# Patient Record
Sex: Male | Born: 1975
Health system: Western US, Academic
[De-identification: ages and names within clinical notes are randomized; demographics above are authoritative.]

## PROBLEM LIST (undated history)

## (undated) ENCOUNTER — Emergency Department (HOSPITAL_COMMUNITY): Admission: EM | Payer: Self-pay

## (undated) DIAGNOSIS — M459 Ankylosing spondylitis of unspecified sites in spine: Secondary | ICD-10-CM

## (undated) DIAGNOSIS — M5030 Other cervical disc degeneration, unspecified cervical region: Secondary | ICD-10-CM

## (undated) DIAGNOSIS — J45909 Unspecified asthma, uncomplicated: Secondary | ICD-10-CM

## (undated) DIAGNOSIS — M479 Spondylosis, unspecified: Secondary | ICD-10-CM

## (undated) HISTORY — PX: SPINE SURGERY: SHX786

## (undated) HISTORY — PX: TONSILLECTOMY: SUR1361

## (undated) MED ORDER — SODIUM CHLORIDE 0.9 % IV SOLN
600.00 mg | Freq: Once | INTRAVENOUS | Status: AC
Start: 2019-07-13 — End: 2019-07-08

## (undated) MED ORDER — DAPTOMYCIN 500 MG IV SOLR
600.00 mg | Freq: Once | INTRAVENOUS | Status: AC
Start: 2019-07-20 — End: 2019-07-15

## (undated) MED ORDER — DAPTOMYCIN 500 MG IV SOLR
600.00 mg | Freq: Once | INTRAVENOUS | Status: AC
Start: 2019-07-22 — End: 2019-07-17

## (undated) MED ORDER — DAPTOMYCIN 500 MG IV SOLR
600.00 mg | Freq: Once | INTRAVENOUS | Status: AC
Start: 2019-07-08 — End: 2019-07-03

## (undated) MED ORDER — DAPTOMYCIN 500 MG IV SOLR
600.00 mg | Freq: Once | INTRAVENOUS | Status: AC
Start: 2019-07-05 — End: 2019-06-30

## (undated) MED ORDER — SODIUM CHLORIDE 0.9 % IV SOLN
600.00 mg | Freq: Once | INTRAVENOUS | Status: AC
Start: 2019-07-12 — End: 2019-07-07

## (undated) MED ORDER — SODIUM CHLORIDE 0.9 % IV SOLN
600.00 mg | Freq: Once | INTRAVENOUS | Status: AC
Start: 2019-06-24 — End: 2019-06-24

## (undated) MED ORDER — DAPTOMYCIN 500 MG IV SOLR
600.00 mg | Freq: Once | INTRAVENOUS | Status: AC
Start: 2019-07-25 — End: 2019-07-20

## (undated) MED ORDER — SODIUM CHLORIDE 0.9 % IV SOLN
INTRAVENOUS | Status: AC
Start: 2019-07-24 — End: 2019-07-19

## (undated) MED ORDER — DAPTOMYCIN 500 MG IV SOLR
600.00 mg | Freq: Once | INTRAVENOUS | Status: AC
Start: 2019-07-24 — End: 2019-07-19

## (undated) MED ORDER — DAPTOMYCIN 500 MG IV SOLR
600.00 mg | Freq: Once | INTRAVENOUS | Status: AC
Start: 2019-07-31 — End: 2019-07-26

## (undated) MED ORDER — SODIUM CHLORIDE 0.9 % IV SOLN
INTRAVENOUS | Status: AC
Start: 2019-07-28 — End: 2019-07-23

## (undated) MED ORDER — SODIUM CHLORIDE 0.9 % IV SOLN
600.00 mg | Freq: Once | INTRAVENOUS | Status: AC
Start: 2019-07-09 — End: 2019-07-04

## (undated) MED ORDER — SODIUM CHLORIDE 0.9 % IV SOLN
600.00 mg | Freq: Once | INTRAVENOUS | Status: AC
Start: 2019-07-27 — End: 2019-07-22

## (undated) MED ORDER — SODIUM CHLORIDE 0.9 % IV SOLN
INTRAVENOUS | Status: AC
Start: 2019-07-04 — End: 2019-06-29

## (undated) MED ORDER — DAPTOMYCIN 500 MG IV SOLR
600.00 mg | Freq: Once | INTRAVENOUS | Status: AC
Start: 2019-07-10 — End: 2019-07-05

## (undated) MED ORDER — SODIUM CHLORIDE 0.9 % IV SOLN
8.00 mg/kg | INTRAVENOUS | Status: AC
Start: 2019-07-05 — End: 2019-08-01

## (undated) MED ORDER — DAPTOMYCIN 500 MG IV SOLR
600.00 mg | Freq: Once | INTRAVENOUS | Status: AC
Start: 2019-08-01 — End: 2019-07-27

## (undated) MED ORDER — DAPTOMYCIN 500 MG IV SOLR
600.00 mg | Freq: Once | INTRAVENOUS | Status: AC
Start: 2019-07-17 — End: 2019-07-12

## (undated) MED ORDER — DAPTOMYCIN 500 MG IV SOLR
600.00 mg | Freq: Once | INTRAVENOUS | Status: AC
Start: 2019-07-16 — End: 2019-07-11

## (undated) MED ORDER — SODIUM CHLORIDE 0.9 % IV SOLN
INTRAVENOUS | Status: AC
Start: 2019-07-03 — End: 2019-06-28

## (undated) MED ORDER — SODIUM CHLORIDE 0.9 % IV SOLN
INTRAVENOUS | Status: AC
Start: 2019-07-08 — End: 2019-07-03

## (undated) MED ORDER — DAPTOMYCIN 500 MG IV SOLR
8.00 mg/kg | INTRAVENOUS | Status: AC
Start: 2019-07-05 — End: 2019-08-01

## (undated) MED ORDER — SODIUM CHLORIDE 0.9 % IV SOLN
INTRAVENOUS | Status: AC
Start: 2019-07-14 — End: 2019-07-09

## (undated) MED ORDER — SODIUM CHLORIDE 0.9 % IV SOLN
INTRAVENOUS | Status: AC
Start: 2019-07-05 — End: 2019-06-30

## (undated) MED ORDER — SODIUM CHLORIDE 0.9 % IV SOLN
600.00 mg | Freq: Once | INTRAVENOUS | Status: AC
Start: 2019-07-04 — End: 2019-06-29

## (undated) MED ORDER — SODIUM CHLORIDE 0.9 % IV SOLN
INTRAVENOUS | Status: AC
Start: 2019-06-24 — End: 2019-06-24

## (undated) MED ORDER — SODIUM CHLORIDE 0.9 % IV SOLN
INTRAVENOUS | Status: AC
Start: 2019-07-26 — End: 2019-07-21

## (undated) MED ORDER — SODIUM CHLORIDE 0.9 % IV SOLN
600.00 mg | Freq: Once | INTRAVENOUS | Status: AC
Start: 2019-07-02 — End: 2019-06-27

## (undated) MED ORDER — SODIUM CHLORIDE 0.9 % IV SOLN
INTRAVENOUS | Status: AC
Start: 2019-08-02 — End: 2019-07-28

## (undated) MED ORDER — DAPTOMYCIN 500 MG IV SOLR
600.00 mg | Freq: Once | INTRAVENOUS | Status: AC
Start: 2019-07-01 — End: 2019-07-01

## (undated) MED ORDER — SODIUM CHLORIDE 0.9 % IV SOLN
600.00 mg | Freq: Once | INTRAVENOUS | Status: AC
Start: 2019-07-29 — End: 2019-07-24

## (undated) MED ORDER — SODIUM CHLORIDE 0.9 % IV SOLN
INTRAVENOUS | Status: AC
Start: 2019-07-17 — End: 2019-07-12

## (undated) MED ORDER — DAPTOMYCIN 500 MG IV SOLR
600.00 mg | Freq: Once | INTRAVENOUS | Status: AC
Start: 2019-07-11 — End: 2019-07-06

## (undated) MED ORDER — SODIUM CHLORIDE 0.9 % IV SOLN
INTRAVENOUS | Status: AC
Start: 2019-07-11 — End: 2019-07-06

## (undated) MED ORDER — SODIUM CHLORIDE 0.9 % IV SOLN
INTRAVENOUS | Status: AC
Start: 2019-07-29 — End: 2019-07-24

## (undated) MED ORDER — SODIUM CHLORIDE 0.9 % IV SOLN
INTRAVENOUS | Status: AC
Start: 2019-07-19 — End: 2019-07-14

## (undated) MED ORDER — DAPTOMYCIN 500 MG IV SOLR
600.00 mg | Freq: Once | INTRAVENOUS | Status: AC
Start: 2019-07-30 — End: 2019-07-25

## (undated) MED ORDER — SODIUM CHLORIDE 0.9 % IV SOLN
600.00 mg | Freq: Once | INTRAVENOUS | Status: AC
Start: 2019-07-28 — End: 2019-07-23

## (undated) MED ORDER — SODIUM CHLORIDE 0.9 % IV SOLN
INTRAVENOUS | Status: AC
Start: 2019-07-31 — End: 2019-07-26

## (undated) MED ORDER — SODIUM CHLORIDE 0.9 % IV SOLN
600.00 mg | Freq: Once | INTRAVENOUS | Status: AC
Start: 2019-07-07 — End: 2019-07-02

## (undated) MED ORDER — SODIUM CHLORIDE 0.9 % IV SOLN
INTRAVENOUS | Status: AC
Start: 2019-07-25 — End: 2019-07-20

## (undated) MED ORDER — SODIUM CHLORIDE 0.9 % IV SOLN
INTRAVENOUS | Status: AC
Start: 2019-07-18 — End: 2019-07-13

## (undated) MED ORDER — DAPTOMYCIN 500 MG IV SOLR
600.00 mg | Freq: Once | INTRAVENOUS | Status: AC
Start: 2019-07-18 — End: 2019-07-13

## (undated) MED ORDER — DAPTOMYCIN 500 MG IV SOLR
600.00 mg | Freq: Once | INTRAVENOUS | Status: AC
Start: 2019-07-23 — End: 2019-07-18

## (undated) MED ORDER — SODIUM CHLORIDE 0.9 % IV SOLN
INTRAVENOUS | Status: AC
Start: 2019-07-09 — End: 2019-07-04

## (undated) MED ORDER — SODIUM CHLORIDE 0.9 % IV SOLN
600.00 mg | Freq: Once | INTRAVENOUS | Status: AC
Start: 2019-07-19 — End: 2019-07-14

## (undated) MED ORDER — DAPTOMYCIN 500 MG IV SOLR
600.00 mg | Freq: Once | INTRAVENOUS | Status: AC
Start: 2019-07-26 — End: 2019-07-21

## (undated) MED ORDER — SODIUM CHLORIDE 0.9 % IV SOLN
600.00 mg | Freq: Once | INTRAVENOUS | Status: AC
Start: 2019-07-14 — End: 2019-07-09

## (undated) MED ORDER — SODIUM CHLORIDE 0.9 % IV SOLN
INTRAVENOUS | Status: AC
Start: 2019-07-15 — End: 2019-07-10

## (undated) MED ORDER — SODIUM CHLORIDE 0.9 % IV SOLN
INTRAVENOUS | Status: AC
Start: 2019-07-01 — End: 2019-07-01

## (undated) MED ORDER — SODIUM CHLORIDE 0.9 % IV SOLN
INTRAVENOUS | Status: AC
Start: 2019-06-25 — End: 2019-06-25

## (undated) MED ORDER — DAPTOMYCIN 500 MG IV SOLR
6.00 mg/kg | INTRAVENOUS | Status: AC
Start: 2019-06-22 — End: ?

## (undated) MED ORDER — SODIUM CHLORIDE 0.9 % IV SOLN
600.00 mg | Freq: Once | INTRAVENOUS | Status: AC
Start: 2019-07-15 — End: 2019-07-10

## (undated) MED ORDER — SODIUM CHLORIDE 0.9 % IV SOLN
INTRAVENOUS | Status: AC
Start: 2019-07-07 — End: 2019-07-02

## (undated) MED ORDER — SODIUM CHLORIDE 0.9 % IV SOLN
INTRAVENOUS | Status: AC
Start: 2019-08-01 — End: 2019-07-27

## (undated) MED ORDER — DAPTOMYCIN 500 MG IV SOLR
600.00 mg | Freq: Once | INTRAVENOUS | Status: AC
Start: 2019-07-21 — End: 2019-07-16

## (undated) MED ORDER — SODIUM CHLORIDE 0.9 % IV SOLN
INTRAVENOUS | Status: AC
Start: 2019-07-13 — End: 2019-07-08

## (undated) MED ORDER — SODIUM CHLORIDE 0.9 % IV SOLN
600.00 mg | Freq: Once | INTRAVENOUS | Status: AC
Start: 2019-06-26 — End: 2019-06-26

## (undated) MED ORDER — SODIUM CHLORIDE 0.9 % IV SOLN
INTRAVENOUS | Status: AC
Start: 2019-07-06 — End: 2019-07-01

## (undated) MED ORDER — DAPTOMYCIN 500 MG IV SOLR
600.00 mg | Freq: Once | INTRAVENOUS | Status: AC
Start: 2019-07-03 — End: 2019-06-28

## (undated) MED ORDER — SODIUM CHLORIDE 0.9 % IV SOLN
INTRAVENOUS | Status: AC
Start: 2019-07-16 — End: 2019-07-11

## (undated) MED ORDER — SODIUM CHLORIDE 0.9 % IV SOLN
INTRAVENOUS | Status: AC
Start: 2019-07-22 — End: 2019-07-17

## (undated) MED ORDER — SODIUM CHLORIDE 0.9 % IV SOLN
INTRAVENOUS | Status: AC
Start: 2019-07-30 — End: 2019-07-25

## (undated) MED ORDER — SODIUM CHLORIDE 0.9 % IV SOLN
INTRAVENOUS | Status: AC
Start: 2019-07-10 — End: 2019-07-05

## (undated) MED ORDER — SODIUM CHLORIDE 0.9 % IV SOLN
INTRAVENOUS | Status: AC
Start: 2019-07-12 — End: 2019-07-07

## (undated) MED ORDER — DAPTOMYCIN 500 MG IV SOLR
600.00 mg | Freq: Once | INTRAVENOUS | Status: AC
Start: 2019-07-06 — End: 2019-07-01

## (undated) MED ORDER — SODIUM CHLORIDE 0.9 % IV SOLN
INTRAVENOUS | Status: AC
Start: 2019-07-27 — End: 2019-07-22

## (undated) MED ORDER — SODIUM CHLORIDE 0.9 % IV SOLN
INTRAVENOUS | Status: AC
Start: 2019-07-23 — End: 2019-07-18

## (undated) MED ORDER — SODIUM CHLORIDE 0.9 % IV SOLN
INTRAVENOUS | Status: AC
Start: 2019-06-26 — End: 2019-06-26

## (undated) MED ORDER — SODIUM CHLORIDE 0.9 % IV SOLN
INTRAVENOUS | Status: AC
Start: 2019-07-21 — End: 2019-07-16

## (undated) MED ORDER — SODIUM CHLORIDE 0.9 % IV SOLN
600.00 mg | Freq: Once | INTRAVENOUS | Status: AC
Start: 2019-08-02 — End: 2019-07-28

## (undated) MED ORDER — SODIUM CHLORIDE 0.9 % IV SOLN
INTRAVENOUS | Status: AC
Start: 2019-07-02 — End: 2019-06-27

## (undated) MED ORDER — SODIUM CHLORIDE 0.9 % IV SOLN
600.00 mg | Freq: Once | INTRAVENOUS | Status: AC
Start: 2019-06-25 — End: 2019-06-25

## (undated) MED ORDER — SODIUM CHLORIDE 0.9 % IV SOLN
INTRAVENOUS | Status: AC
Start: 2019-07-20 — End: 2019-07-15

---

## 2000-06-21 ENCOUNTER — Emergency Department (HOSPITAL_COMMUNITY): Admission: EM | Admit: 2000-06-21 | Discharge: 2000-06-21 | Payer: Self-pay | Admitting: Emergency Medicine

## 2000-06-21 ENCOUNTER — Encounter: Payer: Self-pay | Admitting: Emergency Medicine

## 2006-07-25 ENCOUNTER — Emergency Department (HOSPITAL_COMMUNITY): Admission: EM | Admit: 2006-07-25 | Discharge: 2006-07-25 | Payer: Self-pay | Admitting: Emergency Medicine

## 2007-03-20 ENCOUNTER — Emergency Department (HOSPITAL_COMMUNITY): Admission: EM | Admit: 2007-03-20 | Discharge: 2007-03-20 | Payer: Self-pay | Admitting: Emergency Medicine

## 2007-03-26 ENCOUNTER — Emergency Department (HOSPITAL_COMMUNITY): Admission: EM | Admit: 2007-03-26 | Discharge: 2007-03-26 | Payer: Self-pay | Admitting: Emergency Medicine

## 2007-04-18 ENCOUNTER — Emergency Department (HOSPITAL_COMMUNITY): Admission: EM | Admit: 2007-04-18 | Discharge: 2007-04-18 | Payer: Self-pay | Admitting: *Deleted

## 2007-04-26 ENCOUNTER — Ambulatory Visit: Payer: Self-pay | Admitting: Cardiology

## 2007-04-29 ENCOUNTER — Ambulatory Visit: Payer: Self-pay | Admitting: Internal Medicine

## 2007-04-29 ENCOUNTER — Emergency Department (HOSPITAL_COMMUNITY): Admission: EM | Admit: 2007-04-29 | Discharge: 2007-04-29 | Payer: Self-pay | Admitting: Emergency Medicine

## 2007-05-03 ENCOUNTER — Ambulatory Visit: Payer: Self-pay | Admitting: *Deleted

## 2007-05-12 ENCOUNTER — Encounter: Payer: Self-pay | Admitting: Cardiology

## 2007-05-12 ENCOUNTER — Ambulatory Visit: Payer: Self-pay

## 2007-05-18 ENCOUNTER — Ambulatory Visit: Payer: Self-pay | Admitting: Cardiology

## 2007-05-28 ENCOUNTER — Emergency Department (HOSPITAL_COMMUNITY): Admission: EM | Admit: 2007-05-28 | Discharge: 2007-05-28 | Payer: Self-pay | Admitting: Emergency Medicine

## 2007-06-05 ENCOUNTER — Emergency Department (HOSPITAL_COMMUNITY): Admission: EM | Admit: 2007-06-05 | Discharge: 2007-06-05 | Payer: Self-pay | Admitting: Emergency Medicine

## 2007-06-08 ENCOUNTER — Ambulatory Visit: Payer: Self-pay | Admitting: Cardiology

## 2007-07-08 ENCOUNTER — Ambulatory Visit (HOSPITAL_COMMUNITY): Admission: RE | Admit: 2007-07-08 | Discharge: 2007-07-08 | Payer: Self-pay | Admitting: Cardiology

## 2007-07-12 ENCOUNTER — Emergency Department (HOSPITAL_COMMUNITY): Admission: EM | Admit: 2007-07-12 | Discharge: 2007-07-12 | Payer: Self-pay | Admitting: Emergency Medicine

## 2007-07-23 ENCOUNTER — Ambulatory Visit: Payer: Self-pay | Admitting: Cardiology

## 2007-07-24 ENCOUNTER — Emergency Department (HOSPITAL_COMMUNITY): Admission: EM | Admit: 2007-07-24 | Discharge: 2007-07-25 | Payer: Self-pay | Admitting: Emergency Medicine

## 2007-07-29 ENCOUNTER — Ambulatory Visit: Payer: Self-pay | Admitting: Cardiology

## 2007-08-04 ENCOUNTER — Ambulatory Visit: Payer: Self-pay | Admitting: Internal Medicine

## 2007-09-28 ENCOUNTER — Ambulatory Visit: Payer: Self-pay | Admitting: Cardiology

## 2007-09-30 ENCOUNTER — Ambulatory Visit: Payer: Self-pay | Admitting: Internal Medicine

## 2007-10-12 ENCOUNTER — Ambulatory Visit: Payer: Self-pay | Admitting: Internal Medicine

## 2007-10-21 ENCOUNTER — Ambulatory Visit: Payer: Self-pay | Admitting: Internal Medicine

## 2007-10-21 LAB — CONVERTED CEMR LAB
BUN: 13 mg/dL (ref 6–23)
Basophils Absolute: 0 10*3/uL (ref 0.0–0.1)
Basophils Relative: 0.2 % (ref 0.0–1.0)
CO2: 34 meq/L — ABNORMAL HIGH (ref 19–32)
Calcium: 9.1 mg/dL (ref 8.4–10.5)
Eosinophils Absolute: 0.3 10*3/uL (ref 0.0–0.6)
GFR calc Af Amer: 92 mL/min
GFR calc non Af Amer: 76 mL/min
Hemoglobin: 16.2 g/dL (ref 13.0–17.0)
Lymphocytes Relative: 33 % (ref 12.0–46.0)
MCHC: 34.5 g/dL (ref 30.0–36.0)
MCV: 91.8 fL (ref 78.0–100.0)
Monocytes Absolute: 0.6 10*3/uL (ref 0.2–0.7)
Monocytes Relative: 9.2 % (ref 3.0–11.0)
Neutro Abs: 3.8 10*3/uL (ref 1.4–7.7)
Platelets: 173 10*3/uL (ref 150–400)
Potassium: 4.1 meq/L (ref 3.5–5.1)
Prothrombin Time: 11.3 s (ref 10.9–13.3)

## 2007-10-25 ENCOUNTER — Ambulatory Visit (HOSPITAL_COMMUNITY): Admission: RE | Admit: 2007-10-25 | Discharge: 2007-10-25 | Payer: Self-pay | Admitting: Internal Medicine

## 2007-10-25 ENCOUNTER — Ambulatory Visit: Payer: Self-pay | Admitting: Internal Medicine

## 2007-12-17 ENCOUNTER — Ambulatory Visit: Payer: Self-pay | Admitting: Cardiology

## 2008-04-18 ENCOUNTER — Encounter: Admission: RE | Admit: 2008-04-18 | Discharge: 2008-04-18 | Payer: Self-pay | Admitting: Orthopedic Surgery

## 2008-04-26 ENCOUNTER — Ambulatory Visit (HOSPITAL_COMMUNITY): Admission: RE | Admit: 2008-04-26 | Discharge: 2008-04-27 | Payer: Self-pay | Admitting: Neurosurgery

## 2008-05-10 ENCOUNTER — Emergency Department (HOSPITAL_COMMUNITY): Admission: EM | Admit: 2008-05-10 | Discharge: 2008-05-10 | Payer: Self-pay | Admitting: Emergency Medicine

## 2008-05-11 ENCOUNTER — Inpatient Hospital Stay (HOSPITAL_COMMUNITY): Admission: EM | Admit: 2008-05-11 | Discharge: 2008-05-12 | Payer: Self-pay | Admitting: Emergency Medicine

## 2008-05-14 ENCOUNTER — Inpatient Hospital Stay (HOSPITAL_COMMUNITY): Admission: EM | Admit: 2008-05-14 | Discharge: 2008-05-23 | Payer: Self-pay | Admitting: Emergency Medicine

## 2008-05-15 ENCOUNTER — Ambulatory Visit: Payer: Self-pay | Admitting: Infectious Disease

## 2008-06-08 ENCOUNTER — Ambulatory Visit: Payer: Self-pay | Admitting: Gastroenterology

## 2008-06-13 ENCOUNTER — Emergency Department (HOSPITAL_COMMUNITY): Admission: EM | Admit: 2008-06-13 | Discharge: 2008-06-13 | Payer: Self-pay | Admitting: Emergency Medicine

## 2008-07-01 ENCOUNTER — Emergency Department (HOSPITAL_COMMUNITY): Admission: EM | Admit: 2008-07-01 | Discharge: 2008-07-01 | Payer: Self-pay | Admitting: Emergency Medicine

## 2008-11-24 ENCOUNTER — Emergency Department (HOSPITAL_COMMUNITY): Admission: EM | Admit: 2008-11-24 | Discharge: 2008-11-24 | Payer: Self-pay | Admitting: Emergency Medicine

## 2009-07-01 ENCOUNTER — Telehealth: Payer: Self-pay | Admitting: Nurse Practitioner

## 2009-07-02 ENCOUNTER — Telehealth: Payer: Self-pay | Admitting: Internal Medicine

## 2009-08-03 ENCOUNTER — Telehealth (INDEPENDENT_AMBULATORY_CARE_PROVIDER_SITE_OTHER): Payer: Self-pay | Admitting: *Deleted

## 2009-09-05 ENCOUNTER — Encounter: Payer: Self-pay | Admitting: Internal Medicine

## 2009-09-05 ENCOUNTER — Ambulatory Visit: Payer: Self-pay

## 2009-09-05 DIAGNOSIS — I4949 Other premature depolarization: Secondary | ICD-10-CM | POA: Insufficient documentation

## 2010-12-02 ENCOUNTER — Encounter: Payer: Self-pay | Admitting: Neurosurgery

## 2011-03-25 NOTE — Assessment & Plan Note (Signed)
[facility]                                 ON-CALL NOTE   GRADY, MOHABIR                       MRN:          [id number]  DATE:07/15/2007                            DOB:          [birth year]    PRIMARY CARE PHYSICIAN:  Dr. Diona Browner.   Mr. Corter is a 35 year old male with a history of PVCs and palpitation  secondary to this. He has been evaluated by Dr. Diona Browner and was doing  well on a beta blocker which he stated was Metoprolol 50 mg b.i.d. He  called this evening because he stated that in the last 48 hours, he has  had recurrent PVCs up to 4 or 5 per minute. He stated that they were  extremely bothersome to him and he did not tolerate them well at all. He  was not having any chest pain or shortness of breath, and was having no  dizziness or pre-syncope, but he still feels that they are bothering him  quite a bit and is very symptomatic with them.   I discussed the situation with Mr. Tweed. I advised him that it would  be appropriate to take an extra half tablet of his beta blocker twice  daily and therefore, increased the dose to 75 mg b.i.d. I advised him  that I would call the office which I have done and set him up for a  follow up appointment with Dr. Diona Browner and they would call him with  this. I reiterated the fact that he should not be taking any over-the-  counter cold medications or products. He should be avoiding caffeine,  and he should not be smoking or doing drugs. He stated that all of this  was correct. He stated that he did not feel the need to come to the  hospital right now. I left it with him that he would contact us if his  symptoms worsened in any way and otherwise we would see him in the  office in follow up, and that he would get his blood pressure checked  after he started on the increased dose of the beta blocker to make sure  that he was doing okay with that.      Theodore Demark, PA-C  Electronically  Signed      Gerrit Friends. Dietrich Pates, MD, [facility]  Electronically Signed   RB/MedQ  DD: 07/15/2007  DT: 07/16/2007  Job #: [id number]

## 2011-03-25 NOTE — Consult Note (Signed)
Kyle [name], Kyle [name]              ACCOUNT NO.:  [id number]   MEDICAL RECORD NO.:  [id number]           PATIENT TYPE:   LOCATION:                                 FACILITY:   PHYSICIAN:  Reginia Forts, MD     DATE OF BIRTH:  [birth year]   DATE OF CONSULTATION:  DATE OF DISCHARGE:                                 CONSULTATION   REASON FOR PHONE NOTE:  Palpitations.   Mr. Ptacek is a 35 year old gentleman with a history of symptomatic  premature ventricular contractions who called tonight complaining of 20-  30 episodes of bigeminy per minute.  He was recently seen in the  emergency room  last night and was discharged home with diltiazem 30 mg  q.6 hours.  He filled a prescription but decided not to take it to see  if his symptoms would recur.  They recurred again tonight at  approximately 1 a.m., and is now calling in.  Upon reaching him by  phone, the patient stated that his symptoms have improved now to 2-3  times a minute, and he thinks he can tough it out.  I have advised him  to start the diltiazem as was recommended last night.  He will take a  dose tonight and monitor his symptoms.  If he has any further issues, he  will call tonight or call the [facility] with Dr. Diona Browner  in the morning.      Reginia Forts, MD  Electronically Signed     RA/MEDQ  D:  07/26/2007  T:  07/26/2007  Job:  [id number]

## 2011-03-25 NOTE — Op Note (Signed)
[name], [name] NO.:  [id number]   MEDICAL RECORD NO.:  [id number]          PATIENT TYPE:  INP   LOCATION:  3022                         FACILITY:  [facility]   PHYSICIAN:  Coletta Memos, M.D.     DATE OF BIRTH:  1976-06-09   DATE OF PROCEDURE:  05/11/2008  DATE OF DISCHARGE:  05/12/2008                               OPERATIVE REPORT   PREOPERATIVE DIAGNOSIS:  Wound infection.   POSTOPERATIVE DIAGNOSIS:  Wound infection.   PROCEDURE:  Irrigation and debridement of wound infection.   COMPLICATIONS:  None.   FINDINGS:  Pus in the superior portion of super fascial aspect of the  wound, significant amount.  Nothing else identified.   ANESTHESIA:  General endotracheal.   SURGEON:  Coletta Memos, MD   ASSISTANT:  Hilda Lias, MD.   INDICATION:  Kyle [name] is a 35 year old who underwent a lumbar  laminectomy for far lateral disk on April 26, 2008.  He developed pain on  May 09, 2008.  He came to the office I was not able to express any  fluid on May 10, 2008.  He was started on antibiotics at that time,  prior to that when he had gone to the emergency room.  He called  yesterday stating that he had a fever.  At that time, I also knew that  the wound started to drain.  I told him to come in and we would simply  taken to the operating room to washout the wound.   OPERATIVE NOTE:  [name] was brought to the operating room  intubated, placed under general anesthetic.  He was rolled prone onto a  Wilson frame and all pressure points were properly padded.  His back was  prepped.  He was draped in a sterile fashion.  Using a #10 blade, an  opened the old incision without difficulty.  I immediately encountered  what was purulent material mostly coming from the superior portion of  the wound.  After opening the entire incision as it certainly appeared  that this was super fascial.  I did open the fascia and there was no  evidence of purulent inferior to that  layer.  I irrigated with Dr.  Jeral Fruit 2 L of normal saline into the incision.  I then closed the  fascial layer and thoracolumbar fascia with 0 Vicryl sutures and I  loosely reapproximated the super fascial layer with 1 Ethilon sutures.  I placed a sterile dressing over that.  He tolerated procedure well.  Specimens were sent to laboratory for both aerobic and anaerobic.  The  patient had been on vancomycin prior to this.           ______________________________  Coletta Memos, M.D.    KC/MEDQ  D:  05/12/2008  T:  05/13/2008  Job:  [id number]

## 2011-03-25 NOTE — Assessment & Plan Note (Signed)
[facility]                            CARDIOLOGY OFFICE NOTE   DANN, GALICIA                       MRN:          [id number]  DATE:07/23/2007                            DOB:          Feb 27, 1976    REASON FOR VISIT:  Followup symptomatic premature ventricular complexes.   HISTORY OF PRESENT ILLNESS:  I saw [name] back in July.  His  history is detailed in my previous note.  He continues to be troubled by  palpitations and has previously-documented ventricular ectopy, sometimes  frequent, although without any clearly sustained arrhythmias.  He  actually did not manifest any significant ectopy, during an exercise  echocardiogram, although did have some bigeminy and trigeminy during an  adenosine Myoview.  His ejection fraction is in the 45-50% range in  diffuse fashion, possibly affected by the premature ventricular  complexes, although it is not entirely clear that it is not the other  way around.  In any event, he has had no frank syncope.  He reports  being very bothered by the palpitations in that it affects his  activities of daily living.  I had him wear a Holter monitor through  [facility] and this actually showed no premature ventricular  complexes, although he did have some atrial ectopy.  We have been slowly  advancing beta blocker therapy, which now includes metoprolol 100 mg  p.o. b.i.d.  Previously, I showed some strips to Dr. Graciela Husbands, wondering  about the possibility of a focus of ventricular ectopy, although it is  not entirely clear to me that this is absolutely the case at this point.  In any event, given his troubling symptoms, he is interested in  electrophysiology consultation, which I will arrange.   ALLERGIES:  PENICILLIN.   PRESENT MEDICATIONS:  Metoprolol 100 mg p.o. b.i.d.   REVIEW OF SYSTEMS:  As described in History of Present Illness.   EXAMINATION:  Blood pressure is 104/62, weight is 165 pounds, heart  rate  is the 60s.  Patient is comfortable and in no acute distress.  NECK:  Shows no elevated jugular venous pressure or loud bruits.  LUNGS:  Clear without labored breathing.  CARDIAC EXAM:  Reveals a regular rate and rhythm, no ectopic beats.  EXTREMITIES:  Show no edema.   IMPRESSION/RECOMMENDATIONS:  1. History of previously-documented frequent ventricular ectopy      without any sustained arrhythmias, based on objective assessment.      Patient is still very troubled by this, although he does state      that, generally, the episodes seem to be somewhat less intense or      frequent with increased beta blocker therapy.  He may simply not be      at a high enough dose and we talked about increasing his metoprolol      to a total of 300 mg daily, taken either b.i.d. or t.i.d.  he also      would like to see Dr. Graciela Husbands from an electrophysiology perspective.      It is not entirely clear to me that this is  a problem that would be      dealt with from the perspective of a procedure, although I would      like his opinion.  He will ultimately need a followup      echocardiogram to reassess left ventricular function, although I      think it is too early to      consider this now.  I will plan to see him back after Dr. Odessa Fleming      consultation.  2. Further plans to follow.     [name], MD  Electronically Signed    SGM/MedQ  DD: 07/23/2007  DT: 07/23/2007  Job #: [id number]   cc:   Duke Salvia, MD, [facility]

## 2011-03-25 NOTE — Assessment & Plan Note (Signed)
[facility]                            CARDIOLOGY OFFICE NOTE   Kyle [name], Kyle [name]                       MRN:          [id number]  DATE:07/29/2007                            DOB:          04-16-1976    PRIMARY:  None.   REASON FOR PRESENTATION:  Evaluate patient with premature ventricular  contractions.   HISTORY OF PRESENT ILLNESS:  The patient is 35 years old.  He is  followed extensively by Dr. Diona Browner and has had workup that has  included an echocardiogram, stress echocardiogram and a nuclear study.  He appears to have a mildly reduced ejection fraction at about 50%.  He  has frequent ventricular ectopy.  He is due to see Dr. Graciela Husbands on  Wednesday.  However, he was in the emergency room with frequent  palpitations and a bigeminal pattern.  He was given a prescription for  Cardizem.  He did not fill this until after talking with our on-call  fellow.  He has now been taking this in addition to his beta blocker as  described below.  He has been fatigued.  He is not having any presyncope  or syncope.  He is still having the ectopy almost all day long.  It  comes on with any activity.  He has not had any chest discomfort, neck  or arm discomfort.  He has no shortness of breath, PND or orthopnea.   PAST MEDICAL HISTORY:  1. Premature ventricular contractions.  2. Mildly reduced ejection fraction.  3. ACL knee surgery.  4. Appendectomy.  5. Hiatal hernia.   ALLERGIES:  PENICILLIN.   MEDICATIONS:  1. Metoprolol 100 mg t.i.d.  2. Diltiazem 30 mg t.i.d.   REVIEW OF SYSTEMS:  As stated in the HPI and otherwise negative for  other systems.   PHYSICAL EXAMINATION:  The patient is in no distress.  Blood pressure  92/54, heart rate 46 and regular, weight 165 pounds, body mass index 26.  HEENT:  Neck no jugular venous distention at 45 degrees.  Carotid  upstroke brisk and symmetric.  No bruits, no thyromegaly.  LUNGS:  Clear to auscultation  bilaterally.  BACK:  No costovertebral angle tenderness.  HEART:  PMI not displaced or sustained.  S1 and S2 within normal limits.  No S3, no S4.  No clicks, rubs or murmurs.  ABDOMEN:  Mildly obese, positive bowel sounds normal in frequency and  pitch.  No bruits, no rebound, no guarding.  No midline pulsatile mass.  No organomegaly.  SKIN:  No rashes, no nodules.  EXTREMITIES:  Show 2+ pulses, no edema.   EKG:  Sinus bradycardia, rate 46, axis within normal limits, intervals  within normal limits, poor anterior R-wave progression, no acute ST-wave  change.   ASSESSMENT AND PLAN:  1. Palpitations.  The patient has had extensive workup for his PVCs.      Unfortunately, these remain quite symptomatic.  At this point I am      going to try to switch around his regimen a little bit.  I am going      to have him  take 60 mg t.i.d. of the diltiazem and reduce the      metoprolol to 50 mg t.i.d.  He knows that this is a trial to see if      he has any improvement.  Ultimately, if he has a focus that is      ablatable, he wants to talk to Dr. Graciela Husbands about this.  Finally, we      could consider an antiarrhythmic.  Though he had a slightly low      ejection fraction, he has no other structural heart disease that      would absolutely preclude a class Ic agent.  2. Followup will be on Wednesday with Dr. Graciela Husbands.  He is encouraged to      call this office should he have any presyncope, syncope or      worsening symptoms.     Rollene Rotunda, MD, [facility]  Electronically Signed    JH/MedQ  DD: 07/29/2007  DT: 07/29/2007  Job #: [id number]

## 2011-03-25 NOTE — Op Note (Signed)
NAMEESSAM, [name] NO.:  [id number]   MEDICAL RECORD NO.:  [id number]          PATIENT TYPE:  OIB   LOCATION:  2899                         FACILITY:  [facility]   PHYSICIAN:  Duke Salvia, MD, FACCDATE OF BIRTH:  1976/08/04   DATE OF PROCEDURE:  10/25/2007  DATE OF DISCHARGE:  10/25/2007                               OPERATIVE REPORT   PREOPERATIVE DIAGNOSIS:  Ventricular ectopy from right ventricular  outflow tract.   POSTOPERATIVE DIAGNOSIS:  Ventricular ectopy from the left ventricular  outflow tract.   PROCEDURES:  Invasive electrophysiological study with arrhythmia mapping  and pace mapping.   DESCRIPTION OF PROCEDURE:  Following obtaining informed consent, Mr.  [name] was brought to the electrophysiology laboratory and placed on  the fluoroscopic table in the supine position.  After routine prep and  drape, cardiac catheterization was performed with local anesthesia and  conscious sedation.  Noninvasive blood pressure monitoring,  transcutaneous oxygen saturation monitoring and end-tidal CO2 monitoring  were performed continuously throughout the procedure.  Following the  procedure the catheter was removed.  Hemostasis was obtained and the  patient was transferred to the floor in stable condition.   CATHETERS:  A 7-French 5-mm deflectable tip catheter was inserted into  the right femoral vein, two mapping sites in the right ventricular  outflow tract, pacing site in the right atrium, pacing site in the right  ventricle and measurement of the His bundle.   RESULTS:  Surface electrocardiogram.  Rhythm:  Sinus; RRR interval:  1164 milliseconds; PR interval: 163 milliseconds; QRS duration:  109  milliseconds; QT interval:  445 milliseconds; P-wave duration 100  milliseconds; bundle branch block:  Absent; pre-excitation:  Absent.  AH interval:  81 milliseconds; HV interval 41 milliseconds.  AV Wenckebach was 600 milliseconds.  VA Wenckebach was 400  milliseconds.  There was no evidence of accessory pathway.   Arrhythmias induced.  The patient presented to the lab having infrequent  PVCs.  With careful placement of the electrodes, two things were  immediately noted.  The first was that at V1 there was an R greater than  S ratio of 1.  In addition, the R wave in both V1 and V2 was quite  broad, constituting greater than 50% QRS.  Given this, I mentioned to  the patient that it was unlikely that we were going to be able to  identify a site in the right ventricular outflow tract that was  responsible for this ectopy, it is more likely coming from the left  ventricular outflow tract.  He asked that we proceed anyway and so we  went ahead and mapped.  The earliest ventricular activation that we  found was about minus 3-5 milliseconds.  This was right in the area of  the His bundle defect just cephalad to it.  Pace mapping from here,  however, gave a distinctly left bundle branch block QRS morphology.  Mapping throughout the right ventricular outflow tract on both the  septal surface as well as the lateral surface anteriorly and posteriorly  failed to give an upright QRS in lead V1 and indeed  the transitions were  very low at V5-V6.  We were able to get an early transition as we came  more inferiorly into the outflow tract.  However, in no case were we  able to see a transition before V3.   Based on this the catheter was removed.  The patient was informed that  based on the mapping it was my interpretation that the ectopy was  emerging likely from the left ventricular outflow tract and coronary  cusps and that I did not feel comfortable at this point pursuing  ablation of that focus.   The patient tolerated procedure well.      Duke Salvia, MD, [facility]  Electronically Signed     SCK/MEDQ  D:  10/25/2007  T:  10/26/2007  Job:  [id number]   cc:   Kyle Sidle, MD  Electrophysiology Laboratory

## 2011-03-25 NOTE — Assessment & Plan Note (Signed)
[facility]                            CARDIOLOGY OFFICE NOTE   TIMM, BONENBERGER                       MRN:          [id number]  DATE:05/18/2007                            DOB:          05/03/1976    REASON FOR VISIT:  Follow up of cardiac testing.   HISTORY OF PRESENT ILLNESS:  I saw [name] with a history of  premature ventricular complexes and somewhat atypical chest pain.  I  referred him for an exercise echocardiogram, which was unfortunately  nondiagnostic due to failure to achieve adequate heart rate.  He did not  have obvious stress-induced changes echocardiographically to suggest  ischemia, and his electrocardiogram did not demonstrate any diagnostic  ST segment changes at submaximal rates.  His resting ejection fraction  was approximately 50% with no focal wall motion abnormalities.  I  reviewed these results with him today.  He continues to have  intermittent palpitations and chest pain.  He remains very concerned  about this.   I spoke with them about perhaps trying Coreg 3.125 mg p.o. b.i.d.  instead of atenolol and options of either continued observation or  proceeding to a more definitive ischemic assessment.  We can more than  likely achieve this with an Adenosine Myoview, and he was in agreement  with proceeding.  I will plan to have him follow up and discuss the  results.   ALLERGIES:  PENICILLIN.   CURRENT MEDICATIONS:  1. Atenolol 12.5 mg p.o. p.r.n.  2. Flexeril 5 mg p.o. p.r.n.   REVIEW OF SYSTEMS:  As described in the history of present illness.  He  has not had any syncope.   PHYSICAL EXAMINATION:  VITAL SIGNS:  Blood pressure 111/74, heart rate  64.  Weight is 161 pounds.  Otherwise, there has been no change in his  examination.   IMPRESSION/RECOMMENDATIONS:  History of palpitations and premature  ventricular complexes, based on history.  Also associated with atypical  chest pain.  We will plan a change from  p.r.n. atenolol to Coreg 3.125  mg p.o. b.i.d.  I will plan to have further ischemic confirmatory  testing with an Adenosine Myoview.  I will have him follow up in the  office to discuss results.     [name], MD  Electronically Signed   SGM/MedQ  DD: 05/18/2007  DT: 05/18/2007  Job #: [id number]   cc:   Hassan Buckler. Weldon Inches, MD

## 2011-03-25 NOTE — Letter (Signed)
September 30, 2007    [name], MD  [phone]  [place], [place] [address]   RE:  OVA, MEEGAN  MRN:  [id number]  /  DOB:  Apr 19, 1976   Dear Sam:   The patient comes in today and he continues to have symptomatic PVC's.  You increased his metoprolol from 100 to 150 mg t.i.d. and he is on the  Diltiazem.  I thought what we might try is to try Verapamil as an  alternative to Diltiazem and if that does not do the trick to think  about using propafenone.  The electrocardiogram that you obtained, and I  appreciate it, from [facility] demonstrates a transition V3 so it may well be  that this is a right ventricular outflow problem anyway.   What I have suggested is that we will do the propafenone next and if  that does not work, we will take a more accelerated approach to the lab  given the fact that this has been an ongoing thing.   He is to let me know in the next couple of weeks how he is tolerating  the new medication adjustments.    Sincerely,      Duke Salvia, MD, [facility]  Electronically Signed    SCK/MedQ  DD: 09/30/2007  DT: 10/01/2007  Job #: [id number]

## 2011-03-25 NOTE — Assessment & Plan Note (Signed)
[place] [facility]                         ELECTROPHYSIOLOGY OFFICE NOTE   ALDRIDGE, KRZYZANOWSKI                       MRN:          [id number]  DATE:10/18/2007                            DOB:          08-22-1976    [name] was seen over at the [place] office last week because he  was not thriving with his propafenone. We put him on flecainide to see  how he would tolerate this. He is very uncomfortable. The question on  the table was should we proceed with catheter ablation. He would like to  do so. Looking at predictors for LV versus RV origin, it looks like it  probably is RV origin with a late transition and a RV in V2 that is less  than 52% of the QRS duration. Given that, we will plan to schedule this  with an ESI procedure.     Duke Salvia, MD, [facility]  Electronically Signed    SCK/MedQ  DD: 10/18/2007  DT: 10/18/2007  Job #: [id number]

## 2011-03-25 NOTE — Assessment & Plan Note (Signed)
[facility]                            CARDIOLOGY OFFICE NOTE   ALFONSA, VAILE                       MRN:          [id number]  DATE:09/28/2007                            DOB:          10/03/1976    REASON FOR VISIT:  Followup symptomatic premature ventricular complexes.   HISTORY OF PRESENT ILLNESS:  I saw Mr. Shomaker back in September.  His  history is detailed in my previous note, as well as subsequent notes by  Dr. Antoine Poche and consultation by Dr. Graciela Husbands, also in September.  He has  episodes of symptomatic frequent ventricular ectopy and no clear  ischemia by previous testing with an ejection fraction of 45-50%.  Dr.  Graciela Husbands saw him in consultation and, at that time, agreed with advancing  beta blocker and calcium channel blocker therapy, although did reserve  future treatments, including antiarrhythmic therapy versus potential  ablation.  His symptoms progressed.  The patient comes into the clinic  today for a routine visit, having rescheduled or missed his last few  visits.  Dr. Graciela Husbands did provide a prescription for Inderal 10 mg to be  taken before exercise, although the patient states that he misplaced  this and has not used it.  He is now on metoprolol 100 mg p.o. t.i.d.  and Diltiazem 60 mg p.o. t.i.d., which represents an increase, compared  to his previous visit with Dr. Graciela Husbands.  Symptomatically, he reports  flares of palpitations with none the last week, but more frequently-  noted symptoms this week.   Today's electrocardiogram shows sinus bradycardia at 53 beats per minute  with a single premature ventricular complex.  He remains frustrated with  his condition.   ALLERGIES:  PENICILLIN.   PRESENT MEDICATIONS:  1. Metoprolol 100 mg p.o. t.i.d.  2. Diltiazem 60 mg p.o. t.i.d.   REVIEW OF SYSTEMS:  As described in History of Present Illness.  No  syncope.   EXAMINATION:  Blood pressure 110/62, heart rate is 53, weight is 173  pounds.  Patient is comfortable and in no acute distress.  HEENT:  Conjunctiva is normal, oropharynx clear.  NECK:  Supple, no elevated jugular venous pressure, no loud bruits, no  thyromegaly.  LUNGS:  Clear without labored breathing.  CARDIAC EXAM:  Reveals a regular rate and rhythm, no S3 gallop.  ABDOMEN:  Soft, nontender.  EXTREMITIES:  Exhibit no pitting edema.  SKIN:  Warm and dry.  MUSCULOSKELETAL:  No kyphosis is noted.  NEUROPSYCHIATRIC:  The patient is alert and oriented times three.   IMPRESSION/RECOMMENDATIONS:  1. Symptomatic ventricular ectopy.  I appreciate Dr. Odessa Fleming      evaluation back in September.  His feeling at that time is that      these ventricular ectopic beats were coming from the left      ventricular outflow tract, near the left coronary cusp.  Mr.      Bulman remains symptomatic and I have asked him to see if he      tolerates an increase in the metoprolol to 150 mg p.o. t.i.d. with      continuing  the same dose of Diltiazem.  I also provided him with a      prescription for the Inderal at 10 mg prior to exercise, as Dr.      Graciela Husbands had initially recommended.  My concern is that we will be      limited in advancing his beta blocker and/or calcium channel      blocker therapy due to fatigue and weight-gain, which he is      complaining of now, and therefore, at least a trial of      antiarrhythmic therapy may be the next step.  I will schedule a      visit in the near future with Dr. Graciela Husbands to discuss initiating      antiarrhythmics and still keep in the back of our mind the      possibility of an ablation attempt.  2. Mild left ventricular dysfunction without clear ischemia based on      prior testing.  3. Further plans to follow.     [name], MD  Electronically Signed    SGM/MedQ  DD: 09/28/2007  DT: 09/28/2007  Job #: [id number]

## 2011-03-25 NOTE — Assessment & Plan Note (Signed)
[facility]                            CARDIOLOGY OFFICE NOTE   Kyle, [name]                       MRN:          [id number]  DATE:04/26/2007                            DOB:          08/27/1976    REFERRING PHYSICIAN:  Tinnie Gens P. Caporossi, MD   REASON FOR CONSULTATION:  Premature ventricular complexes.   HISTORY OF PRESENT ILLNESS:  Kyle [name] is a pleasant 35 year old male  with no reported major medical conditions who has a 1-1/2 month history  of frequent palpitations.  He describes a feeling of occasionally a  pounding heart rate and also pauses associated with chest discomfort  when he feels these palpitations.  Otherwise he has no clear exertional  chest pain or dyspnea on exertion and cannot document any sort of  pattern of palpitations associated with activity.  He notes these to be  largely sporadic, sometimes worse after eating large meals and sometimes  worse after eating large meals and sometimes worse after drinking  alcohol.  He has cut back on caffeine, stopped smoking, and has not  drank any alcohol recently and does not really notice any major change  as yet in his symptoms.  His resting electrocardiogram is essentially  normal, showing sinus rhythm with sinus arrhythmia.  He was told that he  had premature ventricular complex with some bigeminy and trigeminy  during emergency department visit, although he has had no event  recording or other cardiac testing as yet.  He denies having any frank  syncope with his symptoms.   ALLERGIES:  PENICILLIN.   PRESENT MEDICATIONS:  1. Flexeril 5 mg p.o. p.r.n.  2. Atenolol 25 mg p.o. p.r.n.   PAST MEDICAL HISTORY:  Is as outlined above.  Has a history of ACL/knee  surgery, appendectomy and hiatal hernia.   SOCIAL HISTORY:  1. Patient is single, has no children, denies any illicit substance      use or active tobacco use.  2. He quit smoking in June.  3. He drinks occasionally  one alcoholic beverage a week.  4. He is not exercising regularly at this point.  5. 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   REVIEW OF SYSTEMS:  As described in the History of Present Illness.  He  does have SEASONAL ALLERGIES.  He was diagnosed with bronchial asthma at  the age of 62.  Denies any major psychosocial stress.   FAMILY HISTORY:  Patient is adopted.   EXAMINATION:  Blood pressure is 107/65, heart rate is 75, weight is 164  pounds.  Patient is normally nourished, in no acute distress.  HEENT:  Conjunctiva is normal.  Oropharynx is clear.  NECK:  Supple.  No elevated jugular venous pressure without bruits.  No  thyromegaly is noted.  LUNGS:  Clear with regular breathing at rest.  CARDIAC EXAM:  A regular rate and rhythm.  No pericardial rub, S3 gallop  or loud murmur, PMI is nondisplaced.  ABDOMEN:  Soft, nontender, normoactive bowel sounds.  EXTREMITIES:  No pitting edema.  Skin is warm and dry.  Scattered  tattoos noted.  Pulses are 2+.  MUSCULOSKELETAL:  No kyphosis noted.  NEURO/PSYCHIATRIC:  Patient alert and oriented x3.  Affect is normal.   IMPRESSION AND RECOMMENDATION:  1. Palpitations associated with atypical chest pain.  Resting      electrocardiogram is normal.  Premature ventricular complexes have      apparently been documented based on some emergency department      visits, although I have nothing to confirm this.  He has had no      frank syncope.  He has already done some basic measures such as      cutting out caffeine, alcohol and smoking and has noticed any      marked improvement as yet.  He took atenolol for a few days but      felt this made him very weak, and he has not used this with any      regularity.  I spoke with him about documenting his palpitations      more clearly with an event recorder and we will also schedule him      for an exercise echocardiogram to exclude any cardiac structural      abnormalities and  also any possibility of exercise induced      ventricular arrhythmias.  I will have him follow up in the office      over the next few weeks to discuss the results.  2. Further plans to follow.     [name], MD  Electronically Signed    SGM/MedQ  DD: 04/26/2007  DT: 04/26/2007  Job #: [id number]   cc:   Hassan Buckler. Weldon Inches, MD

## 2011-03-25 NOTE — Assessment & Plan Note (Signed)
[facility]                                 ON-CALL NOTE   LOWELL, MAKARA                       MRN:          [id number]  DATE:04/17/2008                            DOB:          [birth year]    SUMMARY OF HISTORY:  On 04/05/08, at approximately 6:40, I received a  page from the answering service that the patient needs a medication  called in.  Within 5 minutes of that, I received a second page giving a  different phone number.  That phone number was [phone].  On three  separate occasions over the next hour and a half, I tried calling this  number and nobody answered.     Joellyn Rued, PA-C  Electronically Signed    EW/MedQ  DD: 04/17/2008  DT: 04/17/2008  Job #: [id number]

## 2011-03-25 NOTE — Discharge Summary (Signed)
NAMEJARNELL, Kyle [name] NO.:  [id number]   MEDICAL RECORD NO.:  [id number]          PATIENT TYPE:  INP   LOCATION:  3022                         FACILITY:  [facility]   PHYSICIAN:  Coletta Memos, M.D.     DATE OF BIRTH:  05/23/1976   DATE OF ADMISSION:  05/11/2008  DATE OF DISCHARGE:  05/12/2008                               DISCHARGE SUMMARY   The patient has left against medical advice.   TIME:  2149 hours.   ADMITTING DIAGNOSIS:  Wound infection.   DISCHARGE DIAGNOSIS:  Wound infection.   PROCEDURES:  He underwent today incision and drainage of a wound  infection.   INDICATIONS:  [name] is a young man who had on April 26, 2008, a  lumbar laminectomy for lateral disk herniation.  He contacted our office  on, I believe, May 09, 2008, stating that he had some pain in his back.  He was told if the pain was so severe he could go to the emergency room.  He then went to the emergency room and was evaluated by Dr. Gray Bernhardt.  Dr. Effie Shy contacted Dr. Lovell Sheehan to he thought was on call.  Dr.  Lovell Sheehan told him he was no longer on call.  This was at approximately  7:30 on May 10, 2008.  At that time, he contacted Dr. Maeola Harman who  was on call at that time.  Dr. Maeola Harman then called me and told me  that Dr. Effie Shy had evaluated [name], felt that he did not have  swelling on his back and had given him an antibiotic.  At that point,  [name] contacted my office at [facility]  sometime after 9 a.m.  At that time, Kyle [name] stated that he had a  significant amount of swelling.  He thought that the emergency room  physician had errored in his evaluation, and I told Mr. Ground to come  into the office for evaluation.  Kyle [name] was seen by myself in the  office on May 10, 2008.  I was unable at that time to express any fluid  from the incision.  It was erythematous.  It was tender.  I then  arranged for [name] to go back  to the emergency room to receive 1 g  of vancomycin intravenously.  Kyle [name] complied, went back to the  emergency room, and receive that antibiotic.  He also had been given  doxycycline as he is allergic to PENICILLIN while in the emergency room.  Kyle [name] then contacted [name] twice on May 10, 2008, that evening  stating that he had a great deal of pain and that he was not better.  [name] told him to see if the antibiotics were working and given some  more time.  On the second call, [name] had informed me that the  wound was draining slightly.  [name] said to keep the wound covered.  [name] at that point contacted me again on May 11, 2008, stating  that the wound at that time had started  to drain.  He said that the  swelling had gone down to some degree, but it was still quite tender.  He was afebrile at that first phone call.  I told Kyle [name] to  continue taking antibiotics and we would see what effect that they would  have.  His white count initially was 11.1, I believe when he was in the  emergency room.  Sedimentation rate was 7.  [name] called me later  on the second stating that he was now running a fever.  At that time, I  told him to come in through the emergency room where I had arranged for  his admission.  He was admitted last night to [facility].  Since he had just eaten prior to his arrival at the hospital, I did not  feel that the risk of aspiration was worth him going to the operating  room on an emergent basis.  We deferred that until the a.m. and he was  made n.p.o., started on IV, given IV antibiotics overnight, and had  another white count performed which was now 14, so the white count had  gone up.  The wound was clearly erythematous.  It was tender and areas  on the wound had opened where they had not been opened the previous  morning when I examined them.   Kyle [name] was taken to the operating room today where he  underwent an  decision and drainage.  He had purulent material described in the  operative note.  His wound was lightly closed with 1 Ethilon sutures.  He had a dressing placed.  Kyle [name] had expressed interest in being  able to leave the hospital to attend a birthday party which he said was  at 11 p.m.  I told him that while I did not think that it was possible.  I received a call from [facility] approximately sometime between 7 and  8, I believe, stating that Kyle [name] again was inquiring about whether  or not he could go.  I instructed Josie Saunders who was the nurse taking  care of him, to inform Mr. Sapia that would be coming to the hospital  but I thought it was quite unlikely that I would release him as I  thought it would be a good deal of drainage from the wound as I did not  close it tightly secondary to the fact that he had the infection.  I  then received another call from Eppie Gibson at approximately, I  believe, 2053 hours which was the time on my paging system.  At that  time, Eppie Gibson, the nurse now taking care of Kyle [name], stated  that he was not able to be found and he was not in the room and that  they had sent security to look for him.  I returned to the hospital at  approximately 9:40 and at that time, the patient was not found.  He was  not in his room.  He left a note in his room stating that he had taken a  walk and that he would be back later.  The official policy of [facility] was that the patient had left against medical advice if he is  not able to be found within 1-hour time.  He had been out of the room  for more than 1 hour at this point.  Given that, the patient has left  against medical advice.  He left  with an IV as far as we know still  within his upper extremity.  [name] was seen by Ms. Derrell Lolling entering  the elevator and at that point in time, he has not been seen again.  She  did not have a chance to perform an assessment  of his.  The last I saw  Mr. Leonhard, his dressing was intact.  He was moving all extremities.  This was in the postanesthesia recovery area.  Again in no time, the  patient was given instructions to leave, in no time, the patient told  that he would be leaving with the consent of myself or the medical staff  at the [facility].           ______________________________  Coletta Memos, M.D.     KC/MEDQ  D:  05/12/2008  T:  05/13/2008  Job:  [id number]

## 2011-03-25 NOTE — H&P (Signed)
NAMEDELRICO, MINEHART NO.:  [id number]   MEDICAL RECORD NO.:  [id number]          PATIENT TYPE:  INP   LOCATION:  3037                         FACILITY:  [facility]   PHYSICIAN:  Coletta Memos, M.D.     DATE OF BIRTH:  Aug 23, 1976   DATE OF ADMISSION:  05/14/2008  DATE OF DISCHARGE:                              HISTORY & PHYSICAL   Mr. Canterbury has had a number of notes dictated within the last 48 hours.  I will not go into great detail as his discharge summary from May 12, 2008, details everything to that point.  Mr. Gopal left his hospital  AMA after undergoing an irrigation and debridement of an infected wound,  status post a lumbar laminectomy on April 26, 2008.  He had contacted me  by phone yesterday asking how to take care of his wound, I gave him  instructions.  He did not mention coming back to the hospital.  He  called again today and I spoke with his aunt.  I have to assume that he  and his aunt had a discussion and he has now returned to the hospital  for treatment.  He has Staphylococcus aureus which was identified after  he left the hospital.  He has been taking Bactrim.  I do not have  sensitivities back at this time.  This is an interim history and  physical as again he had just been in the hospital.  Physical exam shows  pupils are equal, round, and reactive to light.  Full extraocular  movements.  Tongue and uvula in the midline.  Shoulder shrug is normal.  He has normal fund of knowledge.  5/5 strength in the upper and lower  extremities.  Wound is erythematous.  I am still not able to express any  significant drainage.  I did tie and closed it very loosely.  He is  erythematous along his flanks.  We will start him on vancomycin.  I will  see how he is doing with that regimen.  I will have a white count and  sedimentation rate sent today for baseline.  I do not know what he did  or did not do after he left the hospital against medical advice.  I will  have the infectious disease see him in a consultation tomorrow.  We will  start him on vancomycin tonight.           ______________________________  Coletta Memos, M.D.     KC/MEDQ  D:  05/14/2008  T:  05/15/2008  Job:  [id number]

## 2011-03-25 NOTE — Assessment & Plan Note (Signed)
[facility]                                 ON-CALL NOTE   Kyle [name], Kyle [name]                       MRN:          [id number]  DATE:07/24/2007                            DOB:          [birth year]    CARDIOLOGIST:  Dr. Diona Browner.   TELEPHONE NUMBER:  [phone].   HISTORY:  Kyle [name] is a 35 year old male patient with a history of  symptomatic PVCs and ventricular ectopy with an EF of 45% - 50%.  He  called the answering service tonight with complaints of palpitations.  He has seen Dr. Diona Browner in the past for symptomatic PVCs, bigeminy and  trigeminy.  He has been evaluated with monitors in the past.  He just  saw Dr. Diona Browner yesterday in the office who increased his metoprolol to  100 mg three times a day.  Patient called the answering service tonight  with worsening palpitations.  He denies syncope or near syncope but has  felt lightheaded from time to time.  He does feel somewhat shortness of  breath and has some chest discomfort with his palpitations.  According  to the notes he is due to be referred to Dr. Graciela Husbands for further  recommendations regarding his significantly symptomatic PVCs.   PLAN:  I explained to Kyle [name] that at 10:30 on Saturday night there  is nothing else I can offer him besides being seen in the emergency  room.  He is comfortable with this.  I have advised him to go to [facility] Emergency Room.  I asked him to have somebody drive him.  He is not  interested in calling an ambulance and I do not think at this point in  time that that is absolutely necessary.  However, I did recommend that  he not drive.   DISPOSITION:  The patient will go to the [facility] Emergency Room for  further evaluation tonight.      Tereso Newcomer, PA-C  Electronically Signed      Luis Abed, MD, [facility]  Electronically Signed   SW/MedQ  DD: 07/24/2007  DT: 07/25/2007  Job #: [id number]

## 2011-03-25 NOTE — Discharge Summary (Signed)
NAMEJACOBB, Kyle [name] NO.:  [id number]   MEDICAL RECORD NO.:  [id number]          PATIENT TYPE:  INP   LOCATION:  3037                         FACILITY:  [facility]   PHYSICIAN:  Coletta Memos, M.D.     DATE OF BIRTH:  Aug 12, 1976   DATE OF ADMISSION:  05/14/2008  DATE OF DISCHARGE:                               DISCHARGE SUMMARY   DIAGNOSIS:  Wound infection, methicillin-resistant Staphylococcus  aureus.   INDICATIONS:  [name] is a gentleman who was admitted to the  hospital after sustaining a wound infection after an operation done on  April 26, 2008.  He was readmitted to the hospital on May 11, 2008, and  on May 12, 2008, underwent a wound exploration, which revealed frank  pus.  I washed out the wound.  I closed the fascial layers.  There did  not appear to be any infection beneath the fascia and he was brought  back to his room.  He left the hospital against medical advice on May 12, 2008, with a fresh wound.  He came back to the hospital on May 14, 2008, after having removed his IV in trying to deal with his wound for 2  days on its own.  Since that time, I re-opened the wound because I was  not sure what he had done.  During that period of time, he was out of  the hospital and it looked somewhat red, and I started to have the wound  packed.  Then, I had a wound VAC placed and he was going to be  discharged home.  I spoke to [name] and told him I did not trust  him to go home with a wound VAC dressing.  He says that he would do much  better since he knows that it would help him heal that much faster.  I  will now make sure that it will occur.  Attempt to have him treated with  a wound VAC after his discharge.  He will go home today with his mother  being given instructions of how to pack his wound.  His back feels  better.  His flanks feel better and his right lower extremity feels much  better.  He is not having any pain there whatsoever.  He had  another MRI  during this admission secondary to some flank erythema and it showed  absolutely no significant problem there.  He has been on vancomycin per  IV, and he received a PICC line.  He will receive vancomycin with home  therapy.  He has been followed also by infectious disease.  He was given  instructions as to how to treat the wound.           ______________________________  Coletta Memos, M.D.     KC/MEDQ  D:  05/22/2008  T:  05/23/2008  Job:  [id number]

## 2011-03-25 NOTE — Op Note (Signed)
[name], [name]              ACCOUNT NO.:  [id number]   MEDICAL RECORD NO.:  [id number]          PATIENT TYPE:  OIB   LOCATION:  3534                         FACILITY:  [facility]   PHYSICIAN:  Coletta Memos, M.D.     DATE OF BIRTH:  06-08-1976   DATE OF PROCEDURE:  04/26/2008  DATE OF DISCHARGE:  04/27/2008                               OPERATIVE REPORT   PREOPERATIVE DIAGNOSIS:  Far-lateral disk herniation L4-5 right side.   POSTOPERATIVE DIAGNOSIS:  Far-lateral disk herniation L4-5 right side.   PROCEDURE:  Right far-lateral diskectomy with microdissection L4-5 on  the left.   SURGEON:  Coletta Memos, M.D.   ASSISTANT:  Hilda Lias, M.D.   COMPLICATIONS:  None.   INDICATIONS:  [name] is a young man with significant pain in his  right lower extremity.  There is consistent with an L4 radiculopathy.  MRI showed far-lateral disk herniation.  I therefore offered he agreed  to undergo operative decompression as he did not want to do any further  conservative treatment.   BODY OF NOTE:  [name] was brought to the operating room intubated  and placed under general anesthetic without difficulty.  His back was  prepped and he was draped in a sterile fashion.  I infiltrated 0.5%  lidocaine, 1:200,000 strength epinephrine into the lumbar region.  I  opened the skin with a #10 blade took this down to the thoracolumbar  fascia.  I then exposed the lamina of L4 and the pars interarticularis  of L4.  I was able to then use a Kerrison punch and notch.  We drilled  and removed some of the lateral more portion of the pars.  I then was  able to identify the L4 root and the neural foramen and the far-lateral  position.  I was able to then remove what was not a significant amount  of distal what was a fairly large bony edge.  The nerve root was very  well decompressed postoperatively, and I certainly did go into the disk  space both in the medial and lateral foramen to remove disk  material.  This was done without great difficulty.  I used a microscope for  microscopic dissection and help in that area along with Dr. Cassandria Santee  assistance.  After thorough decompression, I then used fentanyl and  steroid over the dorsal root ganglion.  I then closed the wound in  layered fashion using Vicryl sutures to reapproximate the thoracolumbar  subcutaneous and subcuticular layers.  I used Dermabond for sterile  dressing.  The patient was then turned prone and extubated.           ______________________________  Coletta Memos, M.D.     KC/MEDQ  D:  05/04/2008  T:  05/05/2008  Job:  [id number]

## 2011-03-25 NOTE — Assessment & Plan Note (Signed)
[facility]                                 ON-CALL NOTE   KOSISOCHUKWU, BURNINGHAM                       MRN:          [id number]  DATE:10/08/2007                            DOB:          July 25, 1976    [name] called in this evening secondary to complaints of nausea,  fatigue, and dizziness that occurs approximately two hours after taking  a dose of Propafenone and persists for approximately eight hours prior  to having to take the next dose.  He has had some decrease in his  symptomatic PVCs while on Propafenone, which was just started a couple  of days ago.  He was asking what he should do.  I recommended that it  sounds as though he has some very common reactions to Propafenone and  perhaps he is not going to tolerate that medication.  At this point, he  can discontinue the Propafenone and resume his prior medications, which  were Lopressor 100 to 150 mg t.i.d. as well as Verapamil 80 mg t.i.d.  He asked if it was possible to increase the dose of the Verapamil as he  did not feel that the 80 mg dose was adequately keeping his PVCs at bay,  and I said yes that he could go up to one-and-a-half tablets or 120 mg  t.i.d. provided that his systolic blood pressure was greater than 100  and baseline heart rate was greater than 50.  I will call into the  office and leave a message for him to be scheduled to see Dr. Graciela Husbands at  some point next week and advised him that if he does not hear from our  office by midday Monday that he should call in as well.     Nicolasa Ducking, ANP  Electronically Signed    CB/MedQ  DD: 10/08/2007  DT: 10/08/2007  Job #: [id number]

## 2011-03-25 NOTE — Assessment & Plan Note (Signed)
[facility]                            CARDIOLOGY OFFICE NOTE   Kyle, [name]                       MRN:          [id number]  DATE:06/08/2007                            DOB:          1976/08/15    REASON FOR VISIT:  Follow up premature ventricular  complexes/palpitations.   HISTORY OF PRESENT ILLNESS:  I saw [name] earlier in July. His  history is detailed in my previous notes. I referred him for an  adenosine Myoview given a technically nondiagnostic exercise  echocardiogram. This study showed some left ventricular cavity  enlargement with soft tissue attenuation but no evidence of scar or  ischemia. No ejection fraction was calculated, I presumed due to  frequent ventricular ectopy. His ejection fraction had previously been  quantified in the 45-50% range by echocardiography, and he had no major  valvular abnormalities. He continues to complain of problems with  frequent palpitations. He has had no syncope. His medical regimen has  been changed since I last saw him. I initially placed him on Coreg 3.125  mg p.o. b.i.d., and this was increased after an emergency department  visit to 6.25 mg p.o. b.i.d. He returned for another emergency  department visit, and the medication was switched to Toprol-XL 50 mg  daily, which he has been taking for the last few days. He feels that  this may be somewhat more effective, but he still has frequent ectopy. I  reviewed his stress echocardiogram tracings with Dr. Graciela Husbands and discussed  the situation with him. The morphology of the premature ventricular  complexes looks to be potentially indicative of origination in the left  ventricular outflow track. I have not seen any documentation of  sustained ventricular arrhythmias, and as stated before, he has not had  any syncope. His ischemic evaluation is overall reassuring. The question  is whether he has frequent ventricular ectopy with resulting mild  cardiomyopathy, or vice versa. He is not manifesting any heart failure  symptoms, and his main complaint is that of palpitations.   ALLERGIES:  PENICILLIN.   PRESENT MEDICATIONS:  Toprol-XL 50 mg p.o. daily.   REVIEW OF SYSTEMS:  As described in the history of present illness.   EXAMINATION:  VITAL SIGNS:  Blood pressure today 92/57, heart rate 54,  weight 162 pounds.  GENERAL:  The patient is normally nourished in no acute distress.  NECK:  Examination of the neck reveals no elevated jugular venous  pressure and no bruits. No thyromegaly.  LUNGS:  Are clear without labored at rest.  CARDIAC EXAM:  Reveals a regular rate and rhythm with frequent ectopic  beats. No loud systolic murmur or S3 gallop.  EXTREMITIES:  Continue to exhibit no edema.   IMPRESSION AND RECOMMENDATIONS:  1. Frequent ventricular ectopy without any documentation of sustained      ventricular arrhythmias. There has been no associated syncope, and      recent ischemic evaluation was overall reassuring. He does have      evidence of mild cardiomyopathy with an ejection fraction of 45-      50%. I discussed the  situation and reviewed the strips with Dr.      Graciela Husbands today in clinic and also went over the information with the      patient. My plan is to continue Toprol-XL, possibly advancing this      to 75 mg daily in divided dose as tolerated. I would also like to      get a 24-hour Holter monitor to quantify percentage of premature      ventricular complexes versus sinus beats. It may well be that he      has a focus of ventricular ectopy and that at some point ablation      would be a consideration. I will have him return to the clinic to      review his Holter monitor and symptom control on increased dose      beta blocker therapy. If he continues to manifest frequent ectopy,      a formal electrophysiology consultation will be pursued. He was      comfortable with this plan.  2. Further plans to follow.      [name], MD  Electronically Signed    SGM/MedQ  DD: 06/08/2007  DT: 06/09/2007  Job #: [id number]

## 2011-03-25 NOTE — H&P (Signed)
NAMEAHMADOU, BOLZ NO.:  [id number]   MEDICAL RECORD NO.:  [id number]          PATIENT TYPE:  INP   LOCATION:  3022                         FACILITY:  [facility]   PHYSICIAN:  Coletta Memos, M.D.     DATE OF BIRTH:  1976/06/20   DATE OF ADMISSION:  05/11/2008  DATE OF DISCHARGE:                              HISTORY & PHYSICAL   ADMITTING DIAGNOSIS:  Lumbar wound infection, status post lumbar  diskectomy.   INDICATIONS:  Mr. Mulvey is a 35 year old young man who underwent a  lumbar laminectomy and diskectomy in a far lateral position on the right  side at L4-L5.  Postoperatively, he had done quite well with resolution  of the pain in his lower extremity.  However, over the last 3 days, it  has been increasing.  His operation was on April 26, 2008.  He first came  to the emergency room on May 10, 2008, secondary to what he said was  swelling in his back with extraordinary pain and difficulty with  sleeping.  He was seen in the emergency room and then called my office.  I then saw him in the office on May 10, 2008.  At that time, I was  unable to express any fluid from his wound, which I put under pressure.  I then sent him back to the emergency room on May 10, 2008, for a gram  of vancomycin to be given IV.  He was also given doxycycline at the time  of ER visit prior to he is being seen in the office.  He called my  partner last night stating that he was having increasing pain.  He  called my partner again last night with the same complaints of  increasing pain.  He then called me again this morning stating that he  had had some drainage from his wound.  At that time, I told him that it  might be best to continue the antibiotics to see if there is any  improvement since we have just started.  He called again this evening at  approximately 8 o'clock or so stating that he now is running a fever.  I  instructed him to go to the emergency room for direct admission to  the  floor.   Mr. Diodato upon interview now at 11:30 he states that his back feels  better.  There is a generalized erythema surrounding the incision and it  has opened superficially and then over places, which were closed  yesterday morning.  Strength is full in the lower extremities.  Proprioception intact.  No cervical masses or bruits.  Lungs are clear.  Heart, regular rhythm and rate.  No murmurs or rubs.  His white count is  down from 11 on May 10, 2008, to 14 this evening.   MEDICATIONS:  Toprol XL, flecainide, and Vicoprofen.  He was also taking  doxycycline.   His sedimentation rate was 7 on May 10, 2008.   PAST MEDICAL HISTORY:  Premature ventricular complexes.  He has been  placed on metoprolol 150 mg p.o. t.i.d. and flecainide 100 mg  p.o.  b.i.d. for that problem.   ALLERGIES:  He has an allergy to PENICILLIN.   SOCIAL HISTORY:  He does not use illicit drugs.  He does have a history  of hepatitis C.  He does not smoke.  He does not drink.  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   PHYSICAL EXAMINATION:  GENERAL:  He is alert and oriented x4.  VITAL SIGNS:  He has blood pressure of 109/70, temperature 101.5, pulse  79, respiratory rate 18, and 98% saturation on air.  HEENT:  Pupils are equal, round, and reactive to light.  Full  extraocular movements.  Symmetric facies.  Tongue, uvula midline.  EXTREMITIES:  Shoulder shrug is normal.  Normal strength in the upper  and lower extremities.  No clubbing, cyanosis, or edema.  ABDOMEN:  Soft and nontender.  There is erythema surrounding his  incision.  No drainage is visible, but there are areas open along the  incision line which were closed yesterday morning.   I will make the patient NPO.  I will plan on taking him to the operating  room in the morning to explore the wound.  It seems that this has  progressed with his white count increasing despite antibiotic therapy.  I will just continue on  vancomycin.  He hopes for a pure culture were  probably abandoned when I had the patient given vancomycin and he has  been on the Bactrim at least since yesterday.           ______________________________  Coletta Memos, M.D.     KC/MEDQ  D:  05/11/2008  T:  05/12/2008  Job:  [id number]

## 2011-03-25 NOTE — Assessment & Plan Note (Signed)
[facility]                                 ON-CALL NOTE   Kyle [name], Kyle [name]                       MRN:          [id number]  DATE:10/22/2007                            DOB:          08-25-1976    Patient of Dr. Graciela Husbands.   PHONE NUMBER:  Is [phone].   Attempted telephone contact October 22, 2007.   I received a call from the answering service tonight saying that Mr.  Kyle [name] needed a phone call regarding a reaction to medications.  I  called the number and got an answering machine and left a message asking  him to please call back if he still needed some help.  I attempted to  look up [name] in the computer but was unable to locate him based  upon the spelling I have of his name.  No further information is  available at this time.  And, I will attempt to contact him again later.      Theodore Demark, PA-C  Electronically Signed      Bevelyn Buckles. Bensimhon, MD  Electronically Signed   RB/MedQ  DD: 10/22/2007  DT: 10/24/2007  Job #: [id number]

## 2011-03-25 NOTE — Assessment & Plan Note (Signed)
[facility]                                 ON-CALL NOTE   EMMITTE, SURGEON                       MRN:          [id number]  DATE:07/19/2007                            DOB:          02-08-1976    He is a patient of Dr. Simona Huh.  I received a page from the  answering service tonight in reference to [facility] with the  message being that he was having chest pains and PVCs.  I reviewed his E-  chart record and see that he had a negative stress echo May 12, 2007.  Further reviewed notes by Dr. Diona Browner as well as numerous on-call phone  notes, noting that he has a history of PVCs with atypical chest pain.  I  called him at the number provided by the answering service, [phone]-  [phone], 3 times and left a message stating to give [place] a call back at the  answering service if he still has a need for [place].  He did not answer his  phone at all and has not called back yet.     Nicolasa Ducking, ANP  Electronically Signed    CB/MedQ  DD: 07/19/2007  DT: 07/20/2007  Job #: [id number]

## 2011-03-25 NOTE — Letter (Signed)
August 04, 2007    Kyle Sidle, MD  [phone]  [place], [place] [address]   RE:  Kyle, [name]  MRN:  [id number]  /  DOB:  Sep 15, 1976   Dear Kyle [name]:   It was a pleasure to see Kyle [name] today at your request because of  PVCs.   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  Over the last 4  months or so he has had increasing problems with these.  More remotely  he was aware of his irregular heart beat only by taking his pulse.  Now  he has become aware of it because of palpitations and when they have  come in flurries, they have been associated with shortness of breath,  lightheadedness and chest discomfort.  They would come in waves of hours  to days and they have been very disruptive.   You undertook a cardiac evaluation including an echo demonstrating  modest depression of left ventricular systolic function at 45-50%.  A  Myoview scan demonstrated no ischemia but it was not able to be gated  because of frequent ventricular ectopy.   Therapeutically he was tried initially on atenolol, which did not work,  Youth worker, which did not work, and then [employer] at 50 mg a day, which did.  He then had more symptoms.  The dose was up-titrated.  That worked for  [employer].  Again symptoms recurred.  It was back down-titrated to 50 mg  and diltiazem to 30 mg three times a day was added.   He is tolerating his medications without significant side effect; he is  aware that his weight has gone up a couple of pounds.  He is not sure  whether this is related to medication or to the possibility that he is  not exercising because exercise seems to provoke these ectopic beats.   SOCIAL HISTORY:  He is a Consulting civil engineer.  He lives with a fellow student who is  a sociology major.  He denies the use of cigarettes, alcohol or  recreational drugs.   MEDICATIONS:  As previously noted.   He is allergic to  PENICILLIN.   PAST SURGICAL HISTORY:  Notable for knee surgery.   On examination, he is a young Caucasian male appearing his stated age of  35.  His blood pressure is 89/50 with a pulse of 45.  These vital signs are  relatively typical for him.  HEENT:  No icterus or xanthomata.  The neck veins were flat and the carotids were brisk and full  bilaterally without bruits.  BACK:  Without kyphosis or scoliosis.  LUNGS:  Clear.  Heart sounds were regular without murmurs or gallops.  ABDOMEN:  Soft with active bowel sounds without midline pulsation or  hepatomegaly.  His femoral pulses were 2+, distal pulses were intact.  There was no  clubbing, cyanosis, or edema.  NEUROLOGIC:  Grossly normal.  SKIN:  Warm and dry.   Electrocardiogram demonstrated sinus rhythm with normal intervals.   Electrocardiogram of his ventricular ectopy demonstrated a left bundle  branch block inferior axis morphology ectopic beat; notably, however,  the transition to a positive QRS occurs in lead V1.   IMPRESSION:  1. Frequent ventricular ectopy associated with symptoms of      lightheadedness, chest pain and shortness of breath, with a left      bundle branch block inferior axis morphology  and a transition at      V1.  2. Modest depression of left ventricular systolic function with an      ejection fraction of 45%.  3. Modest hypotension.   Kyle [name], Kyle [name] has symptomatic PVCs that are much ameliorated by your  medical therapy. He is unable to exercise, so I have given him a  prescription for Inderal 10 mg to try and take before he exercises to  see if that does not ameliorate that problem and he can get back to  doing that.  He is quite frustrated about this whole situation and  feeling that life is a little bit unfair.  We talked a little bit about  that.   The other thing that we discussed were alternative treatment options.  These would include antiarrhythmic drugs as well as catheter  ablation.  The transition of his ectopy in V1 suggests that these are, in fact,  coming from the left ventricular outflow tract and, indeed, the left  coronary cusp.  There is not a huge experience anywhere with these but  they have been able to be carefully ablated either endovascularly or  epicardially with great care as to the location of the coronary ostia.   I have reviewed this with him.   In the interim I have suggested that we continue him on his current  regimen apart from the added Inderal.   In the event that he has more symptoms, we could consider antiarrhythmic  drug therapy and then catheter ablation thereafter.   Thanks very much for the consultation.    Sincerely,      Kyle Salvia, MD, [facility]  Electronically Signed    Kyle [name]/MedQ  DD: 08/04/2007  DT: 08/05/2007  Job #: [id number]   CC:    Kyle [name]

## 2011-03-25 NOTE — Assessment & Plan Note (Signed)
[facility]                                 ON-CALL NOTE   DEMTRIUS, ROUNDS                       MRN:          [id number]  DATE:07/19/2007                            DOB:          08/29/1976    Patient Kyle [name], medical record number [id number], date of birth  1976/02/18, date of note July 19, 2007.  Mr. Wade called back  this evening to discuss his PVCs and chest pain.  Please refer to  earlier note where I was not able to reach him.  He has been having  symptomatic PVCs and shortness of breath pretty much all day.  He called  into the office earlier today and was told to increase his Toprol to 100  mg b.i.d. which he has done.  Despite this, he has not noticed much  change in the amount of his PVCs.  With rest, he is somewhat better.  I  asked what his pulse and blood pressure had been running, and he said  that his pulse has been between 60s-80s while he has no way of checking  his blood pressure.  I asked what his blood pressure normally runs, and  he says it is usually about 100-110 prior to ever being placed on  Toprol.  I advised that given that he is currently taking 100 mg of  Toprol b.i.d., I am a little bit reluctant to advise him to take any  additional beta blocker without knowing what his blood pressure is and  that if he is feeling stable now while resting that he should check his  blood pressure in the morning and let us know in the office how it is  running so we can make some additional medical decision making.  If,  however, he is to become more symptomatic or uncomfortable, that he  should have a low threshold for coming into the ER for evaluation where  we can monitor him and potentially even give him some IV beta blocker.  He asked if we could move up his appointment to see Dr. Diona Browner.  That  is currently scheduled for this coming Friday.  I will call over to the  office and leave a message and informed him  that it may be unlikely that  Dr. Diona Browner is in the office prior to Friday but that perhaps we can  get him on either a PA or nurse practitioner's schedule Wednesday or  Thursday.  He was grateful for the call back and will call into the  office tomorrow or come into the ED if he has any additional questions  or problems.     Nicolasa Ducking, ANP  Electronically Signed    CB/MedQ  DD: 07/19/2007  DT: 07/20/2007  Job #: [id number]

## 2011-03-25 NOTE — Assessment & Plan Note (Signed)
[facility]                            CARDIOLOGY OFFICE NOTE   BRENON, ANTOSH                       MRN:          [id number]  DATE:12/17/2007                            DOB:          06-08-1976    REASON FOR VISIT:  Follow-up premature ventricular complexes.   HISTORY OF PRESENT ILLNESS:  [name] comes back in for a routine  visit.  I saw him originally back in June 2008 in referral for  symptomatic premature ventricular complexes.  In summary, he underwent a  variety of medication adjustments including antiarrhythmics, ultimately  settling on high-dose metoprolol as well as flecainide as outlined  below.  I had him evaluated by Dr. 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  Ablation was not  pursued at that time and Mr. Rehm tells me that he has an appointment  scheduled to see Dr. Macon Large on the 20th of this month.  Symptomatically  he actually reports having fewer palpitations although he is very  concerned about continuing medications long-term to control the problem.  His electrocardiogram today showed sinus rhythm with a leftward axis.  QRS duration of 118 msec and nonspecific ST-T wave changes. QT interval  is normal.  He has had no frank chest pain or syncope.   ALLERGIES:  PENICILLIN.   CURRENT MEDICATIONS:  1. Metoprolol 150 mg p.o. t.i.d.  2. Flecainide 100 mg p.o. b.i.d.   REVIEW OF SYSTEMS:  As described in the history of present illness.  Otherwise noted.   PHYSICAL EXAMINATION:  Blood pressure today 112/66, heart rate is 70.  The patient is comfortable and in no acute distress.  NECK:  No elevated jugular venous pressure and no bruits.  LUNGS:  Clear without labored breathing.  CARDIAC:  Regular rate and rhythm. No S3 gallop or  loud murmur.  EXTREMITIES:  Exhibit no pitting edema.   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  Graciela Husbands at electrophysiology study.  My sense is that the patient's  symptoms are somewhat better controlled on the present regimen, although  he is very reluctant to continue medications long-term and has a visit  scheduled to see Dr. Macon Large at [facility] on the 20th of this  month to discuss the potential for an ablation.  I did not make any  specific medication changes today.     [name], MD  Electronically Signed    SGM/MedQ  DD: 12/17/2007  DT: 12/18/2007  Job #: [id number]   cc:   Duke Salvia, MD, [facility]  Val Riles, MD

## 2011-03-28 NOTE — Assessment & Plan Note (Signed)
[facility]                                 ON-CALL NOTE   LARENZ, FRASIER                       MRN:          [id number]  DATE:02/29/2008                            DOB:          07/18/1976    I received a page through the answering service for Mr. Branscom  regarding medications.  I promptly called him back.  Mr. Seybold stated  he had lost his bottle of his beta-blocker and had not taken it in a few  days but continued to take his flecainide.  He stated he felt much  better off the metoprolol 150 mg p.o. t.i.d. and he decided he was not  going to take it anymore but today he began having some palpitations and  states he did not feel himself.  I explained to Mr. Leighton he could not  abruptly discontinue his beta-blocker and that he need discuss with Dr.  Graciela Husbands whether or not he was a candidate for discontinuing his beta-  blocker therapy.  He stated he had only missed 2 days' doses and would  resume his previously prescribed dose and then speak with Dr. Graciela Husbands and  see if he could stay off this medication.  He denied any  lightheadedness, dizziness, presyncope or syncopal episodes, chest  discomfort.  He was complaining of mild palpitations.      Dorian Pod, ACNP  Electronically Signed      Madolyn Frieze. Jens Som, MD, [facility]  Electronically Signed   MB/MedQ  DD: 03/01/2008  DT: 03/01/2008  Job #: [id number]

## 2011-08-07 LAB — CBC
HCT: 38.2 — ABNORMAL LOW
HCT: 44
HCT: 44.5
Hemoglobin: 16.1
MCHC: 34.7
MCHC: 34.4
MCHC: 35.5
MCV: 90
MCV: 90.5
Platelets: 150
Platelets: 208
RBC: 5.17
RBC: 4.87
RBC: 4.91
RDW: 11.9
RDW: 12.4
WBC: 11.6 — ABNORMAL HIGH
WBC: 14 — ABNORMAL HIGH

## 2011-08-07 LAB — DRUG SCREEN PANEL (SERUM)
Amphetamine Scrn: NEGATIVE
Benzodiazepine Scrn: POSITIVE
Cannabinoid, Blood: NEGATIVE
Opiates, Blood: NEGATIVE
Phencyclidine, Serum: NEGATIVE

## 2011-08-07 LAB — COMPREHENSIVE METABOLIC PANEL
BUN: 16
CO2: 27
Calcium: 9
Chloride: 104
Creatinine, Ser: 1.17
GFR calc non Af Amer: >60
Total Bilirubin: 0.9

## 2011-08-07 LAB — SEDIMENTATION RATE: Sed Rate: 7

## 2011-08-07 LAB — ANAEROBIC CULTURE

## 2011-08-07 LAB — BASIC METABOLIC PANEL
BUN: 9
CO2: 30
Calcium: 9.3
Creatinine, Ser: 1.02
GFR calc Af Amer: >60
GFR calc Af Amer: >60
GFR calc non Af Amer: >60
GFR calc non Af Amer: >60
Potassium: 3.5
Sodium: 137
Sodium: 134 — ABNORMAL LOW

## 2011-08-07 LAB — DIFFERENTIAL
Basophils Absolute: 0
Eosinophils Relative: 2
Eosinophils Relative: 2
Lymphocytes Relative: 12
Lymphocytes Relative: 12
Lymphs Abs: 1.7
Neutro Abs: 9.3 — ABNORMAL HIGH
Neutrophils Relative %: 80 — ABNORMAL HIGH

## 2011-08-07 LAB — WOUND CULTURE

## 2011-08-07 LAB — PROTIME-INR
INR: 1
Prothrombin Time: 13.3

## 2011-08-07 LAB — HEPATIC FUNCTION PANEL
AST: 59 — ABNORMAL HIGH
Albumin: 3.9
Alkaline Phosphatase: 62
Total Protein: 6.7

## 2011-08-07 LAB — HIV ANTIBODY (ROUTINE TESTING W REFLEX): HIV: NONREACTIVE

## 2011-08-22 LAB — BASIC METABOLIC PANEL
CO2: 29
Chloride: 104
GFR calc Af Amer: >60
Potassium: 3.8
Sodium: 140

## 2011-08-22 LAB — CBC
HCT: 46.4
Hemoglobin: 16.1
MCHC: 34.7
MCV: 90.3
RBC: 5.14
WBC: 7.8

## 2011-08-22 LAB — POCT CARDIAC MARKERS: Troponin i, poc: <0.05

## 2011-08-25 LAB — BASIC METABOLIC PANEL
BUN: 12
CO2: 26
Calcium: 9.1
Chloride: 102
Creatinine, Ser: 1.04
GFR calc Af Amer: >60
GFR calc non Af Amer: >60
Sodium: 138

## 2011-08-25 LAB — I-STAT 8, (EC8 V) (CONVERTED LAB)
Acid-Base Excess: 1
Bicarbonate: 29.1 — ABNORMAL HIGH
Chloride: 105
HCT: 47
Operator id: 284251
pCO2, Ven: 57.9 — ABNORMAL HIGH

## 2011-08-25 LAB — POCT CARDIAC MARKERS
CKMB, poc: <1 — ABNORMAL LOW
CKMB, poc: <1 — ABNORMAL LOW
Troponin i, poc: <0.05

## 2011-08-25 LAB — RAPID URINE DRUG SCREEN, HOSP PERFORMED
Amphetamines: NOT DETECTED
Tetrahydrocannabinol: NOT DETECTED

## 2011-08-25 LAB — POCT I-STAT CREATININE: Creatinine, Ser: 1.2

## 2011-08-25 LAB — CBC
HCT: 46.1
Platelets: 187
RDW: 12.1

## 2011-08-27 LAB — I-STAT 8, (EC8 V) (CONVERTED LAB)
Bicarbonate: 28.6 — ABNORMAL HIGH
HCT: 54 — ABNORMAL HIGH
Hemoglobin: 18.4 — ABNORMAL HIGH
Operator id: 270651
Sodium: 139
TCO2: 30

## 2011-08-27 LAB — D-DIMER, QUANTITATIVE: D-Dimer, Quant: <0.22

## 2011-08-27 LAB — POCT CARDIAC MARKERS: Troponin i, poc: <0.05

## 2011-08-28 LAB — I-STAT 8, (EC8 V) (CONVERTED LAB)
Acid-Base Excess: 2
Chloride: 106
HCT: 49
Operator id: 284141
Potassium: 4.2
TCO2: 30
pCO2, Ven: 50.4 — ABNORMAL HIGH

## 2011-08-28 LAB — POCT I-STAT CREATININE: Operator id: 284141

## 2014-03-07 ENCOUNTER — Emergency Department (HOSPITAL_COMMUNITY)
Admission: EM | Admit: 2014-03-07 | Discharge: 2014-03-08 | Disposition: A | Discharge status: Home / Self Care | Payer: Self-pay | Attending: Emergency Medicine | Admitting: Emergency Medicine

## 2014-03-07 ENCOUNTER — Encounter (HOSPITAL_COMMUNITY): Payer: Self-pay | Admitting: Emergency Medicine

## 2014-03-07 ENCOUNTER — Emergency Department (HOSPITAL_COMMUNITY): Payer: Self-pay

## 2014-03-07 DIAGNOSIS — W1809XA Striking against other object with subsequent fall, initial encounter: Secondary | ICD-10-CM | POA: Insufficient documentation

## 2014-03-07 DIAGNOSIS — Y9301 Activity, walking, marching and hiking: Secondary | ICD-10-CM | POA: Insufficient documentation

## 2014-03-07 DIAGNOSIS — Y929 Unspecified place or not applicable: Secondary | ICD-10-CM | POA: Insufficient documentation

## 2014-03-07 DIAGNOSIS — S0990XA Unspecified injury of head, initial encounter: Secondary | ICD-10-CM

## 2014-03-07 DIAGNOSIS — Z23 Encounter for immunization: Secondary | ICD-10-CM | POA: Insufficient documentation

## 2014-03-07 DIAGNOSIS — S0083XA Contusion of other part of head, initial encounter: Secondary | ICD-10-CM | POA: Insufficient documentation

## 2014-03-07 DIAGNOSIS — S0180XA Unspecified open wound of other part of head, initial encounter: Secondary | ICD-10-CM | POA: Insufficient documentation

## 2014-03-07 DIAGNOSIS — Z79899 Other long term (current) drug therapy: Secondary | ICD-10-CM | POA: Insufficient documentation

## 2014-03-07 DIAGNOSIS — S0181XA Laceration without foreign body of other part of head, initial encounter: Secondary | ICD-10-CM

## 2014-03-07 DIAGNOSIS — Z8679 Personal history of other diseases of the circulatory system: Secondary | ICD-10-CM | POA: Insufficient documentation

## 2014-03-07 DIAGNOSIS — Z880 Allergy status to penicillin: Secondary | ICD-10-CM | POA: Insufficient documentation

## 2014-03-07 DIAGNOSIS — S0003XA Contusion of scalp, initial encounter: Secondary | ICD-10-CM | POA: Insufficient documentation

## 2014-03-07 DIAGNOSIS — F17200 Nicotine dependence, unspecified, uncomplicated: Secondary | ICD-10-CM | POA: Insufficient documentation

## 2014-03-07 DIAGNOSIS — S1093XA Contusion of unspecified part of neck, initial encounter: Secondary | ICD-10-CM

## 2014-03-07 MED ORDER — TETANUS-DIPHTH-ACELL PERTUSSIS 5-2.5-18.5 LF-MCG/0.5 IM SUSP
0.5000 mL | Freq: Once | INTRAMUSCULAR | Status: AC
  Administered 2014-03-07: 0.5 mL via INTRAMUSCULAR
  Filled 2014-03-07: qty 0.5

## 2014-03-07 MED ORDER — HYDROMORPHONE HCL PF 1 MG/ML IJ SOLN
1.0000 mg | Freq: Once | INTRAMUSCULAR | Status: AC
  Administered 2014-03-07: 1 mg via INTRAMUSCULAR
  Filled 2014-03-07: qty 1

## 2014-03-07 MED ORDER — HYDROCODONE-IBUPROFEN 7.5-200 MG PO TABS: 1.0000 | ORAL_TABLET | Freq: Four times a day (QID) | ORAL | Status: DC | PRN

## 2014-03-07 MED ORDER — IBUPROFEN 800 MG PO TABS
800.0000 mg | ORAL_TABLET | Freq: Once | ORAL | Status: AC
  Administered 2014-03-07: 800 mg via ORAL
  Filled 2014-03-07: qty 1

## 2014-03-07 NOTE — ED Notes (Signed)
Pt presents with c/o fall and facial laceration. Pt says he became dizzy, passed out, and his his face on the corner of the table. Pt says he believes he did have a positive LOC for a couple of seconds. Pt says he also hit his head.

## 2014-03-07 NOTE — ED Notes (Signed)
Pt states that he has orthostatic hypotension and that he stood up began walking and fell. Pt reports coming to and noticed he was bleeding from chin and side of face. Pt denies n/v. Pt has laceration to R side of chin, skin tear to R side of head and bleeding to lip. Pt alert and ambulatory. Pt denies alcohol or drug used.

## 2014-03-07 NOTE — Discharge Instructions (Signed)
Facial Laceration ° A facial laceration is a cut on the face. These injuries can be painful and cause bleeding. Lacerations usually heal quickly, but they need special care to reduce scarring. °DIAGNOSIS  °Your health care provider will take a medical history, ask for details about how the injury occurred, and examine the wound to determine how deep the cut is. °TREATMENT  °Some facial lacerations may not require closure. Others may not be able to be closed because of an increased risk of infection. The risk of infection and the chance for successful closure will depend on various factors, including the amount of time since the injury occurred. °The wound may be cleaned to help prevent infection. If closure is appropriate, pain medicines may be given if needed. Your health care provider will use stitches (sutures), wound glue (adhesive), or skin adhesive strips to repair the laceration. These tools bring the skin edges together to allow for faster healing and a better cosmetic outcome. If needed, you may also be given a tetanus shot. °HOME CARE INSTRUCTIONS °· Only take over-the-counter or prescription medicines as directed by your health care provider. °· Follow your health care provider's instructions for wound care. These instructions will vary depending on the technique used for closing the wound. °For Sutures: °· Keep the wound clean and dry.   °· If you were given a bandage (dressing), you should change it at least once a day. Also change the dressing if it becomes wet or dirty, or as directed by your health care provider.   °· Wash the wound with soap and water 2 times a day. Rinse the wound off with water to remove all soap. Pat the wound dry with a clean towel.   °· After cleaning, apply a thin layer of the antibiotic ointment recommended by your health care provider. This will help prevent infection and keep the dressing from sticking.   °· You may shower as usual after the first 24 hours. Do not soak the  wound in water until the sutures are removed.   °· Get your sutures removed as directed by your health care provider. With facial lacerations, sutures should usually be taken out after 4 5 days to avoid stitch marks.   °· Wait a few days after your sutures are removed before applying any makeup. °For Skin Adhesive Strips: °· Keep the wound clean and dry.   °· Do not get the skin adhesive strips wet. You may bathe carefully, using caution to keep the wound dry.   °· If the wound gets wet, pat it dry with a clean towel.   °· Skin adhesive strips will fall off on their own. You may trim the strips as the wound heals. Do not remove skin adhesive strips that are still stuck to the wound. They will fall off in time.   °For Wound Adhesive: °· You may briefly wet your wound in the shower or bath. Do not soak or scrub the wound. Do not swim. Avoid periods of heavy sweating until the skin adhesive has fallen off on its own. After showering or bathing, gently pat the wound dry with a clean towel.   °· Do not apply liquid medicine, cream medicine, ointment medicine, or makeup to your wound while the skin adhesive is in place. This may loosen the film before your wound is healed.   °· If a dressing is placed over the wound, be careful not to apply tape directly over the skin adhesive. This may cause the adhesive to be pulled off before the wound is healed.   °·   Avoid prolonged exposure to sunlight or tanning lamps while the skin adhesive is in place. °· The skin adhesive will usually remain in place for 5 10 days, then naturally fall off the skin. Do not pick at the adhesive film.   °After Healing: °Once the wound has healed, cover the wound with sunscreen during the day for 1 full year. This can help minimize scarring. Exposure to ultraviolet light in the first year will darken the scar. It can take 1 2 years for the scar to lose its redness and to heal completely.  °SEEK IMMEDIATE MEDICAL CARE IF: °· You have redness, pain, or  swelling around the wound.   °· You see a yellowish-white fluid (pus) coming from the wound.   °· You have chills or a fever.   °MAKE SURE YOU: °· Understand these instructions. °· Will watch your condition. °· Will get help right away if you are not doing well or get worse. °Document Released: 12/04/2004 Document Revised: 08/17/2013 Document Reviewed: 06/09/2013 °ExitCare® Patient Information ©2014 [employer]. ° °Head Injury, Adult °You have received a head injury. It does not appear serious at this time. Headaches and vomiting are common following head injury. It should be easy to awaken from sleeping. Sometimes it is necessary for you to stay in the emergency department for a while for observation. Sometimes admission to the hospital may be needed. After injuries such as yours, most problems occur within the first 24 hours, but side effects may occur up to 7 10 days after the injury. It is important for you to carefully monitor your condition and contact your health care provider or seek immediate medical care if there is a change in your condition. °WHAT ARE THE TYPES OF HEAD INJURIES? °Head injuries can be as minor as a bump. Some head injuries can be more severe. More severe head injuries include: °· A jarring injury to the brain (concussion). °· A bruise of the brain (contusion). This mean there is bleeding in the brain that can cause swelling. °· A cracked skull (skull fracture). °· Bleeding in the brain that collects, clots, and forms a bump (hematoma). °WHAT CAUSES A HEAD INJURY? °A serious head injury is most likely to happen to someone who is in a car wreck and is not wearing a seat belt. Other causes of major head injuries include bicycle or motorcycle accidents, sports injuries, and falls. °HOW ARE HEAD INJURIES DIAGNOSED? °A complete history of the event leading to the injury and your current symptoms will be helpful in diagnosing head injuries. Many times, pictures of the brain, such as CT or MRI  are needed to see the extent of the injury. Often, an overnight hospital stay is necessary for observation.  °WHEN SHOULD I SEEK IMMEDIATE MEDICAL CARE?  °You should get help right away if: °· You have confusion or drowsiness. °· You feel sick to your stomach (nauseous) or have continued, forceful vomiting. °· You have dizziness or unsteadiness that is getting worse. °· You have severe, continued headaches not relieved by medicine. Only take over-the-counter or prescription medicines for pain, fever, or discomfort as directed by your health care provider. °· You do not have normal function of the arms or legs or are unable to walk. °· You notice changes in the black spots in the center of the colored part of your eye (pupil). °· You have a clear or bloody fluid coming from your nose or ears. °· You have a loss of vision. °During the next 24 hours after the injury,   you must stay with someone who can watch you for the warning signs. This person should contact local emergency services (911 in the [place] if you have seizures, you become unconscious, or you are unable to wake up. °HOW CAN I PREVENT A HEAD INJURY IN THE FUTURE? °The most important factor for preventing major head injuries is avoiding motor vehicle accidents.  To minimize the potential for damage to your head, it is crucial to wear seat belts while riding in motor vehicles. Wearing helmets while bike riding and playing collision sports (like football) is also helpful. Also, avoiding dangerous activities around the house will further help reduce your risk of head injury.  °WHEN CAN I RETURN TO NORMAL ACTIVITIES AND ATHLETICS? °You should be reevaluated by your health care provider before returning to these activities. If you have any of the following symptoms, you should not return to activities or contact sports until 1 week after the symptoms have stopped: °· Persistent headache. °· Dizziness or vertigo. °· Poor attention and  concentration. °· Confusion. °· Memory problems. °· Nausea or vomiting. °· Fatigue or tire easily. °· Irritability. °· Intolerant of bright lights or loud noises. °· Anxiety or depression. °· Disturbed sleep. °MAKE SURE YOU:  °· Understand these instructions. °· Will watch your condition. °· Will get help right away if you are not doing well or get worse. °Document Released: 10/27/2005 Document Revised: 08/17/2013 Document Reviewed: 07/04/2013 °ExitCare® Patient Information ©2014 [employer]. ° °

## 2014-03-19 NOTE — ED Provider Notes (Signed)
CSN: [id number]     Arrival date & time 03/07/14  2030 History   First MD Initiated Contact with Patient 03/07/14 2143     Chief Complaint  Patient presents with  . Fall  . Facial Laceration     (Consider location/radiation/quality/duration/timing/severity/associated sxs/prior Treatment) HPI  [age] year old male presenting after a fall. Patient states he felt dizzy and went to the ground hitting his face the process. He reports a history of orthostatic hypotension and has following this previously. Multiple lacerations. Facial pain and headache. No change in visual acuity. No back pain. No numbness or tingling. No use of blood thinning medication. Unsure of his last tetanus.  History reviewed. No pertinent past medical history. Past Surgical History  Procedure Laterality Date  . Spine surgery     History reviewed. No pertinent family history. History  Substance Use Topics  . Smoking status: Current Every Day Smoker -- 0.50 packs/day    Types: Cigarettes  . Smokeless tobacco: Not on file  . Alcohol Use: Yes    Review of Systems  All systems reviewed and negative, other than as noted in HPI.   Allergies  Penicillins; Banana; Carrot; Celery oil; and Other  Home Medications   Prior to Admission medications   Medication Sig Start Date End Date Taking? Authorizing Provider  gabapentin (NEURONTIN) 600 MG tablet Take 600 mg by mouth 3 (three) times daily.   Yes Historical Provider, MD  morphine (MS CONTIN) 15 MG 12 hr tablet Take 15 mg by mouth 2 (two) times daily.   Yes Historical Provider, MD  OxyCODONE (OXYCONTIN) 10 mg T12A 12 hr tablet Take 10 mg by mouth 4 (four) times daily.   Yes Historical Provider, MD  HYDROcodone-ibuprofen (VICOPROFEN) 7.5-200 MG per tablet Take 1 tablet by mouth every 6 (six) hours as needed for moderate pain. 03/07/14   Raeford RazorStephen Jalyiah Shelley, MD   BP 108/68  Pulse 94  Temp(Src) 98.8 F (37.1 C) (Oral)  Resp 18  Ht 5\' 7"  (1.702 m)  Wt 150 lb (68.04 kg)   BMI 23.49 kg/m2  SpO2 97% Physical Exam  Nursing note and vitals reviewed. Constitutional: He is oriented to person, place, and time. He appears well-developed and well-nourished. No distress.  HENT:  Head:    Multiple areas of ecchymosis, lacerations and abrasions to the face.  Eyes: Conjunctivae are normal. Pupils are equal, round, and reactive to light. Right eye exhibits no discharge. Left eye exhibits no discharge.  Neck: Neck supple.  Cardiovascular: Normal rate, regular rhythm and normal heart sounds.  Exam reveals no gallop and no friction rub.   No murmur heard. Pulmonary/Chest: Effort normal and breath sounds normal. No respiratory distress.  Abdominal: Soft. He exhibits no distension. There is no tenderness.  Musculoskeletal: He exhibits no edema and no tenderness.  No midline spinal tenderness  Neurological: He is alert and oriented to person, place, and time. No cranial nerve deficit. He exhibits normal muscle tone. Coordination normal.  Skin: Skin is warm and dry.  Psychiatric: He has a normal mood and affect. His behavior is normal. Thought content normal.    ED Course  Procedures (including critical care time)  LACERATION REPAIR Performed by: Raeford RazorStephen Raydan Schlabach Authorized by: Raeford RazorStephen Phi Avans Consent: Verbal consent obtained. Risks and benefits: risks, benefits and alternatives were discussed Consent given by: patient Patient identity confirmed: provided demographic data Prepped and Draped in normal sterile fashion Wound explored  Laceration Location: chin  Laceration Length: 4 cm  No Foreign Bodies seen or palpated  Anesthesia: local infiltration  Local anesthetic: lidocaine 2% w epinephrine  Anesthetic total: 3 ml  Irrigation method: syringe Amount of cleaning: standard  Skin closure: 5-0 plain gut  Number of sutures: running  Technique: running  Patient tolerance: Patient tolerated the procedure well with no immediate complications.  LACERATION  REPAIR Performed by: Raeford RazorStephen Dayrin Stallone Authorized by: Raeford RazorStephen Paxon Propes Consent: Verbal consent obtained. Risks and benefits: risks, benefits and alternatives were discussed Consent given by: patient Patient identity confirmed: provided demographic data Prepped and Draped in normal sterile fashion Wound explored  Laceration Location: R temporal region  Laceration Length: 3 cm  No Foreign Bodies seen or palpated  Anesthesia: local infiltration  Local anesthetic: lidocaine 2 % w epinephrine  Anesthetic total: 3 ml  Irrigation method: syringe Amount of cleaning: standard  Skin closure: 5-0 gut  Number of sutures: running  Technique: running  Patient tolerance: Patient tolerated the procedure well with no immediate complications.  LACERATION REPAIR Performed by: Raeford RazorStephen Maleta Pacha Authorized by: Raeford RazorStephen Lealer Marsland Consent: Verbal consent obtained. Risks and benefits: risks, benefits and alternatives were discussed Consent given by: patient Patient identity confirmed: provided demographic data Prepped and Draped in normal sterile fashion Wound explored  Laceration Location: face  Laceration Length: 1.5 cm  No Foreign Bodies seen or palpated  Anesthesia: local infiltration  Local anesthetic: lidocaine 2% w epinephrine  Anesthetic total: 1 ml  Irrigation method: syringe Amount of cleaning: standard  Skin closure: 5-0 gut  Number of sutures: 4  Technique: simple interrupted  Patient tolerance: Patient tolerated the procedure well with no immediate complications.    Labs Review Labs Reviewed - No data to display  Imaging Review No results found.   EKG Interpretation None      MDM   Final diagnoses:  Face lacerations  Facial contusion  Closed head injury    38 year old male with multiple lacerations after a fall. Nonfocal neuro exam. Imaging reassuring. Lacks.. Continued wound care return precautions were discussed.    Raeford RazorStephen Jaceyon Strole, MD 03/19/14 1946

## 2014-06-29 ENCOUNTER — Encounter (HOSPITAL_COMMUNITY): Payer: Self-pay | Admitting: Emergency Medicine

## 2014-06-29 ENCOUNTER — Emergency Department (HOSPITAL_COMMUNITY): Payer: Self-pay

## 2014-06-29 ENCOUNTER — Emergency Department (HOSPITAL_COMMUNITY)
Admission: EM | Admit: 2014-06-29 | Discharge: 2014-06-29 | Disposition: A | Discharge status: Home / Self Care | Payer: Self-pay | Attending: Emergency Medicine | Admitting: Emergency Medicine

## 2014-06-29 DIAGNOSIS — Z9889 Other specified postprocedural states: Secondary | ICD-10-CM | POA: Insufficient documentation

## 2014-06-29 DIAGNOSIS — Z880 Allergy status to penicillin: Secondary | ICD-10-CM | POA: Insufficient documentation

## 2014-06-29 DIAGNOSIS — N509 Disorder of male genital organs, unspecified: Secondary | ICD-10-CM | POA: Insufficient documentation

## 2014-06-29 DIAGNOSIS — M545 Low back pain, unspecified: Secondary | ICD-10-CM | POA: Insufficient documentation

## 2014-06-29 DIAGNOSIS — M5430 Sciatica, unspecified side: Secondary | ICD-10-CM | POA: Insufficient documentation

## 2014-06-29 DIAGNOSIS — F17200 Nicotine dependence, unspecified, uncomplicated: Secondary | ICD-10-CM | POA: Insufficient documentation

## 2014-06-29 LAB — URINALYSIS, ROUTINE W REFLEX MICROSCOPIC
Bilirubin Urine: NEGATIVE
Glucose, UA: NEGATIVE mg/dL
HGB URINE DIPSTICK: NEGATIVE
KETONES UR: NEGATIVE mg/dL
Leukocytes, UA: NEGATIVE
Nitrite: NEGATIVE
Protein, ur: NEGATIVE mg/dL
Specific Gravity, Urine: 1.005 (ref 1.005–1.030)
UROBILINOGEN UA: 0.2 mg/dL (ref 0.0–1.0)
pH: 6 (ref 5.0–8.0)

## 2014-06-29 LAB — CBC WITH DIFFERENTIAL/PLATELET
Basophils Absolute: 0 10*3/uL (ref 0.0–0.1)
Basophils Relative: 0 % (ref 0–1)
EOS ABS: 0.1 10*3/uL (ref 0.0–0.7)
Eosinophils Relative: 2 % (ref 0–5)
HCT: 38.6 % — ABNORMAL LOW (ref 39.0–52.0)
Hemoglobin: 13.4 g/dL (ref 13.0–17.0)
Lymphocytes Relative: 39 % (ref 12–46)
Lymphs Abs: 2 10*3/uL (ref 0.7–4.0)
MCH: 30.6 pg (ref 26.0–34.0)
MCHC: 34.7 g/dL (ref 30.0–36.0)
MCV: 88.1 fL (ref 78.0–100.0)
Monocytes Absolute: 0.4 10*3/uL (ref 0.1–1.0)
Monocytes Relative: 8 % (ref 3–12)
Neutro Abs: 2.6 10*3/uL (ref 1.7–7.7)
Neutrophils Relative %: 51 % (ref 43–77)
PLATELETS: 127 10*3/uL — AB (ref 150–400)
RBC: 4.38 MIL/uL (ref 4.22–5.81)
RDW: 13 % (ref 11.5–15.5)
WBC: 5.2 10*3/uL (ref 4.0–10.5)

## 2014-06-29 LAB — BASIC METABOLIC PANEL
Anion gap: 11 (ref 5–15)
BUN: 17 mg/dL (ref 6–23)
CHLORIDE: 101 meq/L (ref 96–112)
CO2: 25 mEq/L (ref 19–32)
CREATININE: 0.88 mg/dL (ref 0.50–1.35)
Calcium: 8.2 mg/dL — ABNORMAL LOW (ref 8.4–10.5)
GFR calc non Af Amer: >90 mL/min (ref >90)
Glucose, Bld: 102 mg/dL — ABNORMAL HIGH (ref 70–99)
Potassium: 4 mEq/L (ref 3.7–5.3)
Sodium: 137 mEq/L (ref 137–147)

## 2014-06-29 LAB — C-REACTIVE PROTEIN: CRP: 1.6 mg/dL — ABNORMAL HIGH (ref <0.60)

## 2014-06-29 LAB — SEDIMENTATION RATE: SED RATE: 14 mm/h (ref 0–16)

## 2014-06-29 MED ORDER — DIAZEPAM 5 MG PO TABS: 5.0000 mg | ORAL_TABLET | Freq: Two times a day (BID) | ORAL | Status: DC

## 2014-06-29 MED ORDER — HYDROMORPHONE HCL PF 1 MG/ML IJ SOLN
1.0000 mg | Freq: Once | INTRAMUSCULAR | Status: AC
  Administered 2014-06-29: 1 mg via INTRAVENOUS
  Filled 2014-06-29: qty 1

## 2014-06-29 MED ORDER — PREDNISONE 10 MG PO TABS: ORAL_TABLET | ORAL | Status: DC

## 2014-06-29 MED ORDER — SODIUM CHLORIDE 0.9 % IV BOLUS (SEPSIS)
500.0000 mL | Freq: Once | INTRAVENOUS | Status: AC
  Administered 2014-06-29: 500 mL via INTRAVENOUS

## 2014-06-29 MED ORDER — DIAZEPAM 5 MG/ML IJ SOLN
3.7500 mg | Freq: Once | INTRAMUSCULAR | Status: AC
  Administered 2014-06-29: 3.75 mg via INTRAVENOUS
  Filled 2014-06-29: qty 2

## 2014-06-29 MED ORDER — OXYCODONE-ACETAMINOPHEN 5-325 MG PO TABS: 1.0000 | ORAL_TABLET | ORAL | Status: DC | PRN

## 2014-06-29 MED ORDER — GADOBENATE DIMEGLUMINE 529 MG/ML IV SOLN
14.0000 mL | Freq: Once | INTRAVENOUS | Status: AC | PRN
  Administered 2014-06-29: 14 mL via INTRAVENOUS

## 2014-06-29 MED ORDER — NICOTINE 21 MG/24HR TD PT24
21.0000 mg | MEDICATED_PATCH | Freq: Once | TRANSDERMAL | Status: DC
  Administered 2014-06-29: 21 mg via TRANSDERMAL
  Filled 2014-06-29: qty 1

## 2014-06-29 MED ORDER — NAPROXEN 500 MG PO TABS: 500.0000 mg | ORAL_TABLET | Freq: Two times a day (BID) | ORAL | Status: DC

## 2014-06-29 MED ORDER — KETOROLAC TROMETHAMINE 30 MG/ML IJ SOLN
30.0000 mg | Freq: Once | INTRAMUSCULAR | Status: AC
  Administered 2014-06-29: 30 mg via INTRAVENOUS
  Filled 2014-06-29: qty 1

## 2014-06-29 NOTE — ED Notes (Addendum)
Per EMS, pt states he is having lower back pain that radiates around to his groin and down to his knees. Pt describes pain as sharp/shooting. Pain is increased with sitting. Pt injured back on Monday moving furniture and began having pain on Tuesday.

## 2014-06-29 NOTE — Discharge Instructions (Signed)
Radicular Pain Radicular pain in either the arm or leg is usually from a bulging or herniated disk in the spine. A piece of the herniated disk may press against the nerves as the nerves exit the spine. This causes pain which is felt at the tips of the nerves down the arm or leg. Other causes of radicular pain may include: Fractures. Heart disease. Cancer. An abnormal and usually degenerative state of the nervous system or nerves (neuropathy). Diagnosis may require CT or MRI scanning to determine the primary cause.  Nerves that start at the neck (nerve roots) may cause radicular pain in the outer shoulder and arm. It can spread down to the thumb and fingers. The symptoms vary depending on which nerve root has been affected. In most cases radicular pain improves with conservative treatment. Neck problems may require physical therapy, a neck collar, or cervical traction. Treatment may take many weeks, and surgery may be considered if the symptoms do not improve.  Conservative treatment is also recommended for sciatica. Sciatica causes pain to radiate from the lower back or buttock area down the leg into the foot. Often there is a history of back problems. Most patients with sciatica are better after 2 to 4 weeks of rest and other supportive care. Short term bed rest can reduce the disk pressure considerably. Sitting, however, is not a good position since this increases the pressure on the disk. You should avoid bending, lifting, and all other activities which make the problem worse. Traction can be used in severe cases. Surgery is usually reserved for patients who do not improve within the first months of treatment. Only take over-the-counter or prescription medicines for pain, discomfort, or fever as directed by your caregiver. Narcotics and muscle relaxants may help by relieving more severe pain and spasm and by providing mild sedation. Cold or massage can give significant relief. Spinal manipulation is not  recommended. It can increase the degree of disc protrusion. Epidural steroid injections are often effective treatment for radicular pain. These injections deliver medicine to the spinal nerve in the space between the protective covering of the spinal cord and back bones (vertebrae). Your caregiver can give you more information about steroid injections. These injections are most effective when given within two weeks of the onset of pain.  You should see your caregiver for follow up care as recommended. A program for neck and back injury rehabilitation with stretching and strengthening exercises is an important part of management.  SEEK IMMEDIATE MEDICAL CARE IF: You develop increased pain, weakness, or numbness in your arm or leg. You develop difficulty with bladder or bowel control. You develop abdominal pain. Document Released: 12/04/2004 Document Revised: 01/19/2012 Document Reviewed: 02/19/2009 [facility] Patient Information 2015 [place], [place]. This information is not intended to replace advice given to you by your health care provider. Make sure you discuss any questions you have with your health care provider. Sciatica Sciatica is pain, weakness, numbness, or tingling along your sciatic nerve. The nerve starts in the lower back and runs down the back of each leg. Nerve damage or certain conditions pinch or put pressure on the sciatic nerve. This causes the pain, weakness, and other discomforts of sciatica. HOME CARE   Only take medicine as told by your doctor.  Apply ice to the affected area for 20 minutes. Do this 3-4 times a day for the first 48-72 hours. Then try heat in the same way.  Exercise, stretch, or do your usual activities if these do not  make your pain worse.  Go to physical therapy as told by your doctor.  Keep all doctor visits as told.  Do not wear high heels or shoes that are not supportive.  Get a firm mattress if your mattress is too soft to lessen pain and  discomfort. GET HELP RIGHT AWAY IF:   You cannot control when you poop (bowel movement) or pee (urinate).  You have more weakness in your lower back, lower belly (pelvis), butt (buttocks), or legs.  You have redness or puffiness (swelling) of your back.  You have a burning feeling when you pee.  You have pain that gets worse when you lie down.  You have pain that wakes you from your sleep.  Your pain is worse than past pain.  Your pain lasts longer than 4 weeks.  You are suddenly losing weight without reason. MAKE SURE YOU:   Understand these instructions.  Will watch this condition.  Will get help right away if you are not doing well or get worse. Document Released: 08/05/2008 Document Revised: 04/27/2012 Document Reviewed: 03/07/2012 [facility] Patient Information 2015 [place], [place]. This information is not intended to replace advice given to you by your health care provider. Make sure you discuss any questions you have with your health care provider.  Sciatica Sciatica is pain, weakness, numbness, or tingling along the path of the sciatic nerve. The nerve starts in the lower back and runs down the back of each leg. The nerve controls the muscles in the lower leg and in the back of the knee, while also providing sensation to the back of the thigh, lower leg, and the sole of your foot. Sciatica is a symptom of another medical condition. For instance, nerve damage or certain conditions, such as a herniated disk or bone spur on the spine, pinch or put pressure on the sciatic nerve. This causes the pain, weakness, or other sensations normally associated with sciatica. Generally, sciatica only affects one side of the body. CAUSES   Herniated or slipped disc.  Degenerative disk disease.  A pain disorder involving the narrow muscle in the buttocks (piriformis syndrome).  Pelvic injury or fracture.  Pregnancy.  Tumor (rare). SYMPTOMS  Symptoms can vary from mild to very  severe. The symptoms usually travel from the low back to the buttocks and down the back of the leg. Symptoms can include:  Mild tingling or dull aches in the lower back, leg, or hip.  Numbness in the back of the calf or sole of the foot.  Burning sensations in the lower back, leg, or hip.  Sharp pains in the lower back, leg, or hip.  Leg weakness.  Severe back pain inhibiting movement. These symptoms may get worse with coughing, sneezing, laughing, or prolonged sitting or standing. Also, being overweight may worsen symptoms. DIAGNOSIS  Your caregiver will perform a physical exam to look for common symptoms of sciatica. He or she may ask you to do certain movements or activities that would trigger sciatic nerve pain. Other tests may be performed to find the cause of the sciatica. These may include:  Blood tests.  X-rays.  Imaging tests, such as an MRI or CT scan. TREATMENT  Treatment is directed at the cause of the sciatic pain. Sometimes, treatment is not necessary and the pain and discomfort goes away on its own. If treatment is needed, your caregiver may suggest:  Over-the-counter medicines to relieve pain.  Prescription medicines, such as anti-inflammatory medicine, muscle relaxants, or narcotics.  Applying heat  or ice to the painful area.  Steroid injections to lessen pain, irritation, and inflammation around the nerve.  Reducing activity during periods of pain.  Exercising and stretching to strengthen your abdomen and improve flexibility of your spine. Your caregiver may suggest losing weight if the extra weight makes the back pain worse.  Physical therapy.  Surgery to eliminate what is pressing or pinching the nerve, such as a bone spur or part of a herniated disk. HOME CARE INSTRUCTIONS   Only take over-the-counter or prescription medicines for pain or discomfort as directed by your caregiver.  Apply ice to the affected area for 20 minutes, 3-4 times a day for the  first 48-72 hours. Then try heat in the same way.  Exercise, stretch, or perform your usual activities if these do not aggravate your pain.  Attend physical therapy sessions as directed by your caregiver.  Keep all follow-up appointments as directed by your caregiver.  Do not wear high heels or shoes that do not provide proper support.  Check your mattress to see if it is too soft. A firm mattress may lessen your pain and discomfort. SEEK IMMEDIATE MEDICAL CARE IF:   You lose control of your bowel or bladder (incontinence).  You have increasing weakness in the lower back, pelvis, buttocks, or legs.  You have redness or swelling of your back.  You have a burning sensation when you urinate.  You have pain that gets worse when you lie down or awakens you at night.  Your pain is worse than you have experienced in the past.  Your pain is lasting longer than 4 weeks.  You are suddenly losing weight without reason. MAKE SURE YOU:  Understand these instructions.  Will watch your condition.  Will get help right away if you are not doing well or get worse. Document Released: 10/21/2001 Document Revised: 04/27/2012 Document Reviewed: 03/07/2012 [facility] Patient Information 2015 [place], [place]. This information is not intended to replace advice given to you by your health care provider. Make sure you discuss any questions you have with your health care provider.   Emergency Department Resource Guide 1) Find a Doctor and Pay Out of Pocket Although you won't have to find out who is covered by your insurance plan, it is a good idea to ask around and get recommendations. You will then need to call the office and see if the doctor you have chosen will accept you as a new patient and what types of options they offer for patients who are self-pay. Some doctors offer discounts or will set up payment plans for their patients who do not have insurance, but you will need to ask so you aren't  surprised when you get to your appointment.  2) Contact Your Local Health Department Not all health departments have doctors that can see patients for sick visits, but many do, so it is worth a call to see if yours does. If you don't know where your local health department is, you can check in your phone book. The CDC also has a tool to help you locate your state's health department, and many state websites also have listings of all of their local health departments.  3) Find a Walk-in Clinic If your illness is not likely to be very severe or complicated, you may want to try a walk in clinic. These are popping up all over the country in pharmacies, drugstores, and shopping centers. They're usually staffed by nurse practitioners or physician assistants that have been  trained to treat common illnesses and complaints. They're usually fairly quick and inexpensive. However, if you have serious medical issues or chronic medical problems, these are probably not your best option.  No Primary Care Doctor: - Call Health Connect at  [phone] - they can help you locate a primary care doctor that  accepts your insurance, provides certain services, etc. - Physician Referral Service- [phone]  Chronic Pain Problems: Organization         Address  Phone   Notes  [facility]  [phone] Patients need to be referred by their primary care doctor.   Medication Assistance: Organization         Address  Phone   Notes  [facility] Medication [facility] [address] [place] [place], [place] [address] [phone] --Must be a resident of [facility] -- Must have NO insurance coverage whatsoever (no Medicaid/ Medicare, etc.) -- The pt. MUST have a primary care doctor that directs their care regularly and follows them in the community   [facility]  [phone]   [employer]  [phone]    Agencies that provide inexpensive medical care: Organization          Address  Phone   Notes  [facility]  [phone]   [facility]    [phone]   [facility] [address] [place], [place] [address] [phone]   [facility] [address] [place] [phone]   Planned Parenthood    [phone]   [facility]    [phone]   [facility]  [address], [employer] Phone:  [phone], Fax:  [phone] Hours of Operation:  9 am - 6 pm, M-F.  Also accepts Medicaid/Medicare and self-pay.  [facility]  [address], [place] Phone: [phone], Fax: [phone]. Hours of Operation:  8:30 am - 5:30 pm, M-F.  Also accepts Medicaid and self-pay.  [facility] [place] [address] [place] Phone: [phone]   [facility] [address] Natasha Bence [place], [place] [phone], Ext. [phone] Mondays & Thursdays: 7-9 AM.  First 15 patients are seen on a first come, first serve basis.    Medicaid-accepting [facility] Providers:  Organization         Address  Phone   Notes  [facility] [address], [place] [phone] Also accepts self-pay patients.  [facility] [address] Laurell Josephs [place], [place]  [phone]   [facility] [address], [place] [phone]   [facility] [address] [place] [phone]   Renaye Rakers [address], [place]   [phone] Only accepts [place] Access [place] patients after they have their name applied to their card.   Self-Pay (no insurance) in [facility]:  Organization         Address  Phone   Notes  Sickle Cell Patients, [facility] [address] [place], [place] [phone]   [facility] [address] [place], [place] [phone]    [facility]  [address], [place] [phone]   [facility]/Dr. Osei-Bonsu  [address] [place] or [place]  [address] Laure[address], [place] [phone] Phone number for both [facility] and [place] locations is the same.  [facility] [address] [place] [phone]   [facility] [address], [place] or [address] [phone]   [facility] [address] [place] [phone], phone; [phone], fax Sees patients 1st and 3rd Saturday of every month.  Must not qualify for public or private insurance (i.e. Medicaid, Medicare, [place] Health Choice, Veterans' Benefits)  Household income should be no more than 200% of the poverty level The clinic cannot treat you if you are pregnant or think you are pregnant  Sexually transmitted diseases are not treated at the clinic.    Dental Care: Organization         Address  Phone  Notes  [facility] [address] [place], [place] [phone] Accepts children up to age 71 who are enrolled in [place] or [place] Health Choice; pregnant women with a Medicaid card; and children who have applied for Medicaid or [place] Health Choice, but were declined, whose parents can pay a reduced fee at time of service.  [facility]  [address], [place] [phone] Accepts children up to age 23 who are enrolled in [place] or [place] Health Choice; pregnant women with a Medicaid card; and children who have applied for Medicaid or [place] Health Choice, but were declined, whose parents can pay a reduced fee at time of service.  [facility]  [address] [place], [place] [phone] Patients are seen by appointment only. Walk-ins are not accepted. [facility] will see patients 102  years of age and older. Monday - Tuesday (8am-5pm) Most Wednesdays (8:30-5pm) $30 per visit, cash only  [facility]  [address], [facility] [phone] Patients are seen by appointment only. Walk-ins are not accepted. [facility] will see patients 41 years of age and older. One Wednesday Evening (Monthly: Volunteer Based).  $30 per visit, cash only  [employer]  [phone] for adults; Children under age 44, call Graduate Pediatric Dentistry at [phone]. Children aged 27-14, please call [phone] to request a pediatric application.  Dental services are provided in all areas of dental care including fillings, crowns and bridges, complete and partial dentures, implants, gum treatment, root canals, and extractions. Preventive care is also provided. Treatment is provided to both adults and children. Patients are selected via a lottery and there is often a waiting list.   [facility] [address] [place]  [phone] [email or web address]   [facility] [address] [place], [place] [phone], Ext. [phone] Second and Fourth Thursday of each month, opens at 6:30 AM; Clinic ends at 9 AM.  Patients are seen on a first-come first-served basis, and a limited number are seen during each clinic.   [facility]  [address] Ether Griffins [place], [place] [phone]   Eligibility Requirements You must have lived in [place], [place], or [place] counties for at least the last three months.   You cannot be eligible for state or federal sponsored [place], including [employer], [place], or [employer].   You generally cannot be eligible for healthcare insurance through your employer.    How to apply: Eligibility screenings are held  every Tuesday and Wednesday afternoon from 1:00 pm until 4:00 pm. You do not need an appointment for the interview!  [facility] [place]  [address], [place], [place] [phone]   [facility] Health [facility]  [phone]   [facility] Health Department  [phone]   [facility] Health Department  [phone]    Behavioral Health Resources in the Community: Intensive Outpatient Programs Organization         Address  Phone  Notes  [facility] Services [address], [place], [place] [phone]   [facility] Outpatient [address], [place], [place] [phone]   [facility] [address] [place], [place]  [phone]   [facility] [address]  [place], [place] [phone] or [phone]   Substance Abuse Resources Organization         Address  Phone  Notes  [facility]  [phone]   [facility]  [phone]   [facility] [place]  4137423772   Floydene Flock  [phone]   Residential & Outpatient Substance Abuse Program  [phone]   Psychological Services Organization         Address  Phone  Notes  [facility] Behavioral Health  [phone]   [facility] Services  [phone]   [facility] [address], [place] [phone] or [phone]    Mobile Crisis Teams Organization         Address  Phone  Notes  [facility], Mobile Crisis Care Unit  [phone]   [facility]  [address] [place], [place] [phone]   Doristine Locks [address] [place] [phone]    Self-Help/Support Groups Organization         Address  Phone             Notes  [facility] [place] - variety of support groups  336- I7437963 Call for more information  Narcotics Anonymous (NA), [facility] [address], [employer] [place]  2 meetings at this location   Statistician         Address  Phone  Notes  [facility] [address],    [place]   [phone]   [facility]  [address] [place] [address], [place], [place] [phone]   [facility] [address] [place], [place] [phone] Admissions: 8am-3pm M-F  [facility] [address]    [place], [place] [phone]   [facility] [address] [place], [place], [place] [phone]   [facility] [address]  [place], [place] [phone]   [facility] - Intensive Outpatient [address]urell Joseph[address], [place], [place] [phone]   [facility] ([facility] [address] [place]  [place], [place] [phone] or [phone]   [facility] ([facility]) [address], [place], [place] [phone] Accepts Medicaid  [facility] [address]  [place] [phone] Substance Abuse/Addiction Treatment   [facility] Organization         Address  Phone  Notes  [facility]  [phone]   Angie Fava, PhD [address] Ervin Knack [place], [place]   [phone] or [phone]   [facility]al   [address] [place], [place] [phone]   [facility] [address] [place], [place] [phone] Insurance/Medicaid/sponsorship through  [facility]  [facility] [address]                                    [place], [place] [phone] Therapy/tele-psych/case  [facility] [address]   [place], [place] [phone]) [phone]    Dr. Lolly MustacheArfeen  [phone]   [facility]  [employer] [facility] 1) [address], [place] 2) [address], [place] 3)  [address], [place] [phone]) [phone]  [phone](336) [phone]   [facility] Child Abuse Hotline [phone](336) [phone] or [phone](336) [phone] (After Hours)

## 2014-06-29 NOTE — ED Provider Notes (Signed)
Medical screening examination/treatment/procedure(s) were conducted as a shared visit with non-physician practitioner(s) or resident and myself. I personally evaluated the patient during the encounter and agree with the findings.  I have personally reviewed any xrays and/ or EKG's with the provider and I agree with interpretation.  Patient's with history of lumbar surgery/discectomy presents with significant lower back and bilateral pelvic pain worsening since Tuesday. Worse with position, mild pain started on Monday however this pain is more severe and different in any pain his ever had. Patient denies urinary or bowel changes, focal weakness or persistent numbness. Pain is fairly constant sharp. On exam patient has tenderness lower lumbar midline and left paraspinal, pain with flexion of the left lower extremity, 5+ strength with dorsi flexion and plantar flexion of the great toes and flexion of the knees, difficult exam due to pain in hips and back, sensation to sharp intact in major nerves in lower extremity and normal lower extremity reflexes. Plan for pain medicine and reassessment. Pain significantly in ER and x-ray abnormalities reviewed. MRI for further detail in the morning.  Acute back pain lumbar   Enid SkeensJoshua M Mckinzie Saksa, MD 06/29/14 [phone]

## 2014-06-29 NOTE — ED Provider Notes (Signed)
CSN: [id number]     Arrival date & time 06/29/14  0305 History   First MD Initiated Contact with Patient 06/29/14 [phone]     Chief Complaint  Patient presents with  . Back Pain    (Consider location/radiation/quality/duration/timing/severity/associated sxs/prior Treatment) HPI Comments: Patient is a 38 year old male with a history of spinal surgery in 2009 who presents to the emergency department for back pain. Patient states that he was moving furniture on Monday and woke up on Tuesday with back pain. Patient states he usually has low back pain with sciatica, but his pain felt different than this. Patient describes his pain as a "crushing" pain as though "a ton of rocks are crushing his pelvis". Patient states the pain radiates from his low back around to his bilateral inguinal region and down his thighs. Pain is lessened when patient is upright and only moderate when patient is supine. Pain is worse when sitting. Patient endorses some associated testicular tenderness bilaterally, but he denies penile discharge or scrotal swelling. Patient further denies direct trauma or injury to his back, fever, abdominal pain, nausea or vomiting, bowel or bladder incontinence, genital numbness, and an inability to ambulate.  Patient is a 38 y.o. male presenting with back pain. The history is provided by the patient. No language interpreter was used.  Back Pain Associated symptoms: no fever, no numbness and no weakness      History reviewed. No pertinent past medical history. Past Surgical History  Procedure Laterality Date  . Spine surgery     No family history on file. History  Substance Use Topics  . Smoking status: Current Every Day Smoker -- 0.50 packs/day    Types: Cigarettes  . Smokeless tobacco: Never Used  . Alcohol Use: No    Review of Systems  Constitutional: Negative for fever.  Genitourinary: Positive for testicular pain.       Negative for incontinence  Musculoskeletal: Positive for  back pain.  Neurological: Negative for weakness and numbness.  All other systems reviewed and are negative.    Allergies  Penicillins; Banana; Carrot; Celery oil; and Other  Home Medications   Prior to Admission medications   Not on File   BP 101/59  Pulse 69  Temp(Src) 97.7 F (36.5 C) (Oral)  Resp 16  Ht 5' 7"  (1.702 m)  Wt 140 lb (63.504 kg)  BMI 21.92 kg/m2  SpO2 98%  Physical Exam  Nursing note and vitals reviewed. Constitutional: He is oriented to person, place, and time. He appears well-developed and well-nourished. No distress.  Nontoxic/nonseptic appearing  HENT:  Head: Normocephalic and atraumatic.  Eyes: Conjunctivae and EOM are normal. No scleral icterus.  Neck: Normal range of motion.  Cardiovascular: Normal rate, regular rhythm and intact distal pulses.   DP and PT pulses 2+ bilaterally  Pulmonary/Chest: Effort normal. No respiratory distress.  Musculoskeletal: Normal range of motion.  Tenderness to palpation to lumbar midline without bony deformities, step-off, or crepitus. Patient also with lumbar paraspinal tenderness on the left without appreciable spasm. Positive straight leg raise and crossed straight-leg raise.  Neurological: He is alert and oriented to person, place, and time. He exhibits normal muscle tone. Coordination normal.  Patellar and Achilles reflexes intact and equal bilaterally. No gross sensory deficits appreciated. Patient weight-bearing and ambulatory without assistance.  Skin: Skin is warm and dry. No rash noted. He is not diaphoretic. No erythema. No pallor.  Psychiatric: He has a normal mood and affect. His behavior is normal.    ED Course  Procedures (including critical care time) Labs Review Labs Reviewed  CBC WITH DIFFERENTIAL - Abnormal; Notable for the following:    HCT 38.6 (*)    Platelets 127 (*)    All other components within normal limits  URINALYSIS, ROUTINE W REFLEX MICROSCOPIC  SEDIMENTATION RATE  C-REACTIVE  PROTEIN  BASIC METABOLIC PANEL   Imaging Review Dg Lumbar Spine Complete  06/29/2014   CLINICAL DATA:  Back pain  EXAM: LUMBAR SPINE - COMPLETE 4+ VIEW  COMPARISON:  07/01/2008  FINDINGS: No acute fracture or subluxation.  There is focal marked disc narrowing at L4-5 with neighboring bony sclerosis and endplate irregularity/erosion. Osteophytes have developed at this level. Previously, this disc space was only mildly narrowed. There has been no significant progression of disc disease at the neighboring levels. No chart indication of end-stage renal disease.  L5-S1 and L1-S2 degenerative disc narrowing which is mild. Prominent osteophyte from the L2 anterior superior corner is unchanged.  IMPRESSION: 1. No evidence of lumbar spine injury. 2. Focally advanced disc disease at L4-5, new from 2009. Although advanced degenerative disc disease could have this appearance, discitis/osteomyelitis should also be considered given the erosive endplate changes and significant change. CBC and ESR/CRP may be useful in deciding if further imaging evaluation needed.   Electronically Signed   By: Jorje Guild M.D.   On: 06/29/2014 05:13     EKG Interpretation None      MDM   Final diagnoses:  Midline low back pain, with sciatica presence unspecified    38 year old male presents to the emergency department for low back pain after moving furniture 3 days ago. No history of fever, cancer, or IV drug use. Patient neurovascularly intact and without gross sensory deficits. Patient able to ambulate without assistance. X-ray obtained today which shows focally advanced disc disease at L4/5. Differentials include degenerative disc disease versus discitis/osteomyelitis.  Given the severity of pain, concerning history, and x-ray imaging, will pursue further imaging with MRI with and without contrast. Pain controlled with Dilaudid, Toradol, and Valium. Patient signed out to [facility], PA-C at shift change who will  following imaging and disposition appropriately.   Filed Vitals:   06/29/14 0307 06/29/14 0552 06/29/14 0646  BP: 113/45 101/59 116/73  Pulse: 80 69 64  Temp: 97.7 F (36.5 C)    TempSrc: Oral    Resp: 20 16 18   Height: 5' 7"  (1.702 m)    Weight: 140 lb (63.504 kg)    SpO2: 100% 98% 95%       Antonietta Breach, PA-C 06/29/14 [phone]

## 2014-06-29 NOTE — ED Notes (Signed)
Patient in MRI 

## 2014-06-29 NOTE — ED Provider Notes (Signed)
Patient is a 38 y.o. Male who presents to the ED with low back pain with radiation down bilateral legs.  There is associated testicular tenderness.  Patient was signed out to me at shift change by Antony MaduraKelly Humes PA-C.  At the time of sign out labs and MRI were pending after patient had a plain film xray which showed possible discitis.    Patient was alert and oriented when I introduced myself and was resting comfortably.  Patient did mention that he has a history of ankylosing spondylitis and had previous back surgery performed by Dr. Mikal Planeabell several years ago.  Physical examination performed by my peer revealed no neurological deficits.  10:15 am - I spoke with the patient about the results of his lab work which showed no acute abnormalities and specifically showed no elevation of sedimentation rate.  MRI revealed degenerative changes to prior surgery site and spondy at L4-L5 and there was some displacement of the left S1 nerve root.  So signs of cauda equina or myelopathy were found on MRI at this time.    Patient was treated with another injection of dilaudid and fluids given soft pressure.  Patient was ambulated with a steady gait.  Will discharge the patient home with percocet, prednisone taper, valium, and a prescription of naproxen BID which he will take after the prednisone taper is completed.  Patient will be referred back to [facility] at this time for further follow-up.  Patient was told to return to the ED for loss of bowel or bladder, intractable pain, and genital tingling or numbness.  He states understanding and agreement to the above plan.  Patient was discussed with Dr. Jeraldine LootsLockwood prior to discharge and labs were reviewed.  Dr. Jeraldine LootsLockwood agrees with the above plan and workup.   Kyle Burowourtney A Forcucci, PA-C 06/29/14 1028

## 2014-06-29 NOTE — ED Notes (Signed)
PA at bedside.

## 2014-06-29 NOTE — ED Notes (Signed)
Pt back from MRI  Pt alert and oriented x4. Respirations even and unlabored, bilateral symmetrical rise and fall of chest. Skin warm and dry. In no acute distress. Denies needs.

## 2014-06-29 NOTE — ED Provider Notes (Signed)
I was available for supervision during the completion of this patient's care. Please see the initial provider's notes for the entirety of his evaluation.  Gerhard Munchobert Beila Purdie, MD 06/29/14 [phone]

## 2014-07-27 ENCOUNTER — Encounter (HOSPITAL_COMMUNITY): Payer: Self-pay | Admitting: Emergency Medicine

## 2014-07-27 ENCOUNTER — Emergency Department (HOSPITAL_COMMUNITY)
Admission: EM | Admit: 2014-07-27 | Discharge: 2014-07-27 | Disposition: A | Discharge status: Home / Self Care | Payer: Self-pay | Attending: Emergency Medicine | Admitting: Emergency Medicine

## 2014-07-27 DIAGNOSIS — M545 Low back pain, unspecified: Secondary | ICD-10-CM | POA: Insufficient documentation

## 2014-07-27 DIAGNOSIS — F17200 Nicotine dependence, unspecified, uncomplicated: Secondary | ICD-10-CM | POA: Insufficient documentation

## 2014-07-27 DIAGNOSIS — R5381 Other malaise: Secondary | ICD-10-CM | POA: Insufficient documentation

## 2014-07-27 DIAGNOSIS — R5383 Other fatigue: Secondary | ICD-10-CM | POA: Insufficient documentation

## 2014-07-27 DIAGNOSIS — R269 Unspecified abnormalities of gait and mobility: Secondary | ICD-10-CM | POA: Insufficient documentation

## 2014-07-27 DIAGNOSIS — M5430 Sciatica, unspecified side: Secondary | ICD-10-CM | POA: Insufficient documentation

## 2014-07-27 DIAGNOSIS — Z880 Allergy status to penicillin: Secondary | ICD-10-CM | POA: Insufficient documentation

## 2014-07-27 DIAGNOSIS — M5441 Lumbago with sciatica, right side: Secondary | ICD-10-CM

## 2014-07-27 MED ORDER — METHOCARBAMOL 1000 MG/10ML IJ SOLN: 1000.0000 mg | Freq: Once | INTRAMUSCULAR | Status: DC

## 2014-07-27 MED ORDER — KETOROLAC TROMETHAMINE 30 MG/ML IJ SOLN
30.0000 mg | Freq: Once | INTRAMUSCULAR | Status: AC
  Administered 2014-07-27: 30 mg via INTRAVENOUS
  Filled 2014-07-27: qty 1

## 2014-07-27 MED ORDER — DEXAMETHASONE SODIUM PHOSPHATE 10 MG/ML IJ SOLN
10.0000 mg | Freq: Once | INTRAMUSCULAR | Status: AC
  Administered 2014-07-27: 10 mg via INTRAVENOUS
  Filled 2014-07-27: qty 1

## 2014-07-27 MED ORDER — METHOCARBAMOL 1000 MG/10ML IJ SOLN
1000.0000 mg | Freq: Once | INTRAMUSCULAR | Status: DC
  Filled 2014-07-27: qty 10

## 2014-07-27 MED ORDER — METHOCARBAMOL 1000 MG/10ML IJ SOLN
1000.0000 mg | Freq: Once | INTRAVENOUS | Status: DC
  Filled 2014-07-27: qty 10

## 2014-07-27 MED ORDER — METHOCARBAMOL 500 MG PO TABS: 500.0000 mg | ORAL_TABLET | Freq: Two times a day (BID) | ORAL | Status: DC

## 2014-07-27 MED ORDER — METHOCARBAMOL 500 MG PO TABS
1000.0000 mg | ORAL_TABLET | Freq: Once | ORAL | Status: AC
  Administered 2014-07-27: 1000 mg via ORAL
  Filled 2014-07-27: qty 2

## 2014-07-27 NOTE — ED Provider Notes (Signed)
Medical screening examination/treatment/procedure(s) were conducted as a shared visit with non-physician practitioner(s) and myself.  I personally evaluated the patient during the encounter.   EKG Interpretation None     Intact rectal tone witnessed [name] do rectal Intact lower DTRs No step offs or point tenderness of the L spine Intact distal pulses AO3   7510 am case d/w Dr. Mikal Plane.  MRI from 8/20 reviewed via phone.  No indication for repeat MRI at this time.  Patient has not followed up in the office.  He would be happy to see the patient in follow up.    Jasmine Awe, MD 07/27/14 2307

## 2014-07-27 NOTE — ED Notes (Signed)
Pt has clothing, book and crutches to carry home. No wallet or cell phone.

## 2014-07-27 NOTE — ED Notes (Signed)
Pt complains of chronic back pain for three days, pt called EMS at 3am because he was having shooting pains down both legs worse on the right

## 2014-07-27 NOTE — ED Notes (Signed)
Went with NP to speak with patient.  Pt going to get [facility] transport for difficulty with ambulation and transfer.  Pt to d/c home.  [facility] notified.

## 2014-07-27 NOTE — ED Notes (Signed)
Pt is observed sleeping.

## 2014-07-27 NOTE — ED Notes (Signed)
Med changed to PO by NP

## 2014-07-27 NOTE — Progress Notes (Signed)
[facility] Community Liaison Stacy,  ° °Provided pt with a list of self-pay providers to help patient establish a pcp.  °

## 2014-07-27 NOTE — Discharge Instructions (Signed)
Please follow the directions provided.  Be sure to call Dr. Sueanne Margarita office to make an appointment to follow up with this pain.  He is expecting your phone call and will be able to better evaluated your concerns.    GET HELP RIGHT AWAY IF:  Your pain does not go away with rest or medicine.  Your pain does not go away in 1 week.  You have new problems.  You do not feel well.  New pain spreads into your legs.  You cannot control when you poop (bowel movement) or pee (urinate).  Your arms or legs feel weak or lose feeling (numbness).  You feel sick to your stomach (nauseous) or throw up (vomit).  You have belly (abdominal) pain.  You feel like you may pass out (faint).

## 2014-07-27 NOTE — ED Provider Notes (Signed)
CSN: [id number]     Arrival date & time 07/27/14  0324 History   First MD Initiated Contact with Patient 07/27/14 [phone]     Chief Complaint  Patient presents with  . Back Pain   (Consider location/radiation/quality/duration/timing/severity/associated sxs/prior Treatment) HPI Kyle [name] is a 38 yo male with PMH: back pain and spinal surgery, presenting to the ED with c/o worsening back pain x 3 days.  He states was sweeping and noticed some pain, then the next day noticed right leg felt heavy.  Laying down helps the pain some, worse on standing, and walking and rates as 10/10. Reports hx of spinal surgery in 2011. Evaluated for this pain last month with MRI and instructions to be seen by neurosurgery but has not followed up with neurosurgery.  Denies heavy lifting, falls,saddle parasthesia, fever, or  IVDU.   History reviewed. No pertinent past medical history. Past Surgical History  Procedure Laterality Date  . Spine surgery     History reviewed. No pertinent family history. History  Substance Use Topics  . Smoking status: Current Every Day Smoker -- 0.50 packs/day    Types: Cigarettes  . Smokeless tobacco: Never Used  . Alcohol Use: No    Review of Systems  Constitutional: Negative for fever and chills.  HENT: Negative for sore throat.   Eyes: Negative for visual disturbance.  Respiratory: Negative for cough and shortness of breath.   Cardiovascular: Negative for chest pain and leg swelling.  Gastrointestinal: Negative for nausea, vomiting and diarrhea.  Genitourinary: Negative for dysuria.  Musculoskeletal: Positive for back pain and gait problem. Negative for myalgias.  Skin: Negative for rash.  Neurological: Positive for weakness. Negative for numbness and headaches.    Allergies  Penicillins; Banana; Carrot; Celery oil; and Other  Home Medications   Prior to Admission medications   Not on File   BP 123/65  Pulse 73  Temp(Src) 98.1 F (36.7 C) (Oral)  Resp 14   Ht  (1.702 m)  Wt 145 lb (65.772 kg)  BMI 22.71 kg/m2  SpO2 98% Physical Exam  Nursing note and vitals reviewed. Constitutional: He is oriented to person, place, and time. He appears well-developed and well-nourished. No distress.  HENT:  Head: Normocephalic and atraumatic.  Mouth/Throat: Oropharynx is clear and moist. No oropharyngeal exudate.  Eyes: Conjunctivae are normal.  Neck: Neck supple. No thyromegaly present.  Cardiovascular: Normal rate, regular rhythm and intact distal pulses.   Pulmonary/Chest: Effort normal and breath sounds normal. No respiratory distress. He has no wheezes. He has no rales. He exhibits no tenderness.  Abdominal: Soft. There is no tenderness.  Genitourinary: Rectum normal. Rectal exam shows anal tone normal.  Musculoskeletal: He exhibits no tenderness.  Lymphadenopathy:    He has no cervical adenopathy.  Neurological: He is alert and oriented to person, place, and time. No cranial nerve deficit or sensory deficit. GCS eye subscore is 4. GCS verbal subscore is 5. GCS motor subscore is 6.  Pt has good rectal tone.  5/5 dorsi/plantar flexion.    Skin: Skin is warm and dry. No rash noted. He is not diaphoretic.  Psychiatric: He has a normal mood and affect.    ED Course  Procedures (including critical care time) Labs Review Labs Reviewed - No data to display  Imaging Review No results found.   EKG Interpretation None      MDM   Final diagnoses:  Right-sided low back pain with sciatica, sciatica laterality unspecified   Chronic back pain, worse  x 3 days, no new injury.  Dr. Nicanor Alcon eval'd together.  Good sensation, reports rt leg weakness, but 5/5 dorsi/plantar flexion.  Reports unable to straight leg raise, but able to lift with noxious stimuli.  Consulted Dr. Franky Macho, plan includes no new imaging, IV toradol, decadron, PO robaxin.  D/c to follow up with [facility].  Return precautions provided.  Filed Vitals:   07/27/14 0330 07/27/14 0518  07/27/14 0951  BP: 112/56 123/65 108/47  Pulse: 80 73 65  Temp: 98.1 F (36.7 C)    TempSrc: Oral    Resp: Height:  (1.702 m)    Weight: 145 lb (65.772 kg)    SpO2: 100% 98% 98%   Meds given in ED:  Medications  ketorolac (TORADOL) 30 MG/ML injection 30 mg (30 mg Intravenous Given 07/27/14 0815)  dexamethasone (DECADRON) injection 10 mg (10 mg Intravenous Given 07/27/14 0815)  methocarbamol (ROBAXIN) tablet 1,000 mg (1,000 mg Oral Given 07/27/14 0926)    Discharge Medication List as of 07/27/2014  8:59 AM    START taking these medications   Details  methocarbamol (ROBAXIN) 500 MG tablet Take 1 tablet (500 mg total) by mouth 2 (two) times daily., Starting 07/27/2014, Until Discontinued, Print           Harle Battiest, NP 07/29/14 1459

## 2014-07-27 NOTE — ED Notes (Signed)
Per pharmacy, give med IV piggyback.  Holding for medicine.

## 2014-08-01 NOTE — ED Provider Notes (Signed)
Medical screening examination/treatment/procedure(s) were performed by non-physician practitioner and as supervising physician I was immediately available for consultation/collaboration.   EKG Interpretation None       Tkeyah Burkman K Surafel Hilleary-Rasch, MD 08/01/14 0023 

## 2014-08-14 ENCOUNTER — Emergency Department (HOSPITAL_COMMUNITY)
Admission: EM | Admit: 2014-08-14 | Discharge: 2014-08-14 | Disposition: A | Discharge status: Home / Self Care | Payer: Self-pay | Attending: Emergency Medicine | Admitting: Emergency Medicine

## 2014-08-14 ENCOUNTER — Emergency Department (HOSPITAL_COMMUNITY): Payer: Self-pay

## 2014-08-14 ENCOUNTER — Encounter (HOSPITAL_COMMUNITY): Payer: Self-pay | Admitting: Emergency Medicine

## 2014-08-14 DIAGNOSIS — Z880 Allergy status to penicillin: Secondary | ICD-10-CM | POA: Insufficient documentation

## 2014-08-14 DIAGNOSIS — Z720 Tobacco use: Secondary | ICD-10-CM | POA: Insufficient documentation

## 2014-08-14 DIAGNOSIS — J45901 Unspecified asthma with (acute) exacerbation: Secondary | ICD-10-CM | POA: Insufficient documentation

## 2014-08-14 HISTORY — DX: Unspecified asthma, uncomplicated: J45.909

## 2014-08-14 MED ORDER — ALBUTEROL SULFATE HFA 108 (90 BASE) MCG/ACT IN AERS
2.0000 | INHALATION_SPRAY | Freq: Once | RESPIRATORY_TRACT | Status: AC
  Administered 2014-08-14: 2 via RESPIRATORY_TRACT
  Filled 2014-08-14: qty 6.7

## 2014-08-14 MED ORDER — ALBUTEROL SULFATE (2.5 MG/3ML) 0.083% IN NEBU
5.0000 mg | INHALATION_SOLUTION | Freq: Once | RESPIRATORY_TRACT | Status: AC
  Administered 2014-08-14: 5 mg via RESPIRATORY_TRACT
  Filled 2014-08-14: qty 6

## 2014-08-14 NOTE — ED Provider Notes (Signed)
Medical screening examination/treatment/procedure(s) were performed by non-physician practitioner and as supervising physician I was immediately available for consultation/collaboration.   EKG Interpretation None       Gatlin Kittell K Abigal Choung-Rasch, MD 08/14/14 [phone]

## 2014-08-14 NOTE — ED Provider Notes (Signed)
CSN: [id number]     Arrival date & time 08/14/14  0601 History   First MD Initiated Contact with Patient 08/14/14 [phone]     Chief Complaint  Patient presents with  . Cough     (Consider location/radiation/quality/duration/timing/severity/associated sxs/prior Treatment) HPI Comments: This is a 38 year old male with a past medical history of asthma who presents to the emergency department via EMS complaining of dry cough x3 days. Patient reports the cough is "triggering his asthma". He has not used an inhaler "in many years". Admits to associated chills. States he feels like there is something in his chest that he cannot cough up. Denies fever. When walking in the room, pt resting there comfortably, however when asking what is wrong today, he says "this" and starts forcefully coughing.  Patient is a 38 y.o. male presenting with cough. The history is provided by the patient and the EMS personnel.  Cough Associated symptoms: chills     Past Medical History  Diagnosis Date  . Asthma    Past Surgical History  Procedure Laterality Date  . Spine surgery     History reviewed. No pertinent family history. History  Substance Use Topics  . Smoking status: Current Every Day Smoker -- 0.50 packs/day    Types: Cigarettes  . Smokeless tobacco: Never Used  . Alcohol Use: No    Review of Systems  Constitutional: Positive for chills.  Respiratory: Positive for cough.   All other systems reviewed and are negative.     Allergies  Penicillins; Banana; Carrot; Celery oil; and Other  Home Medications   Prior to Admission medications   Not on File   BP 111/44  Pulse 52  Resp 20  SpO2 98% Physical Exam  Nursing note and vitals reviewed. Constitutional: He is oriented to person, place, and time. He appears well-developed and well-nourished. No distress.  HENT:  Head: Normocephalic and atraumatic.  Eyes: Conjunctivae and EOM are normal.  Neck: Normal range of motion. Neck supple.   Cardiovascular: Normal rate, regular rhythm and normal heart sounds.   Pulmonary/Chest: Effort normal.  Scattered mild expiratory wheezes bilateral.  Musculoskeletal: Normal range of motion. He exhibits no edema.  Neurological: He is alert and oriented to person, place, and time.  Skin: Skin is warm and dry.  Psychiatric: He has a normal mood and affect. His behavior is normal.    ED Course  Procedures (including critical care time) Labs Review Labs Reviewed - No data to display  Imaging Review Dg Chest 2 View  08/14/2014   CLINICAL DATA:  Initial evaluation for cough, chest tightness, fever. Smoker.  EXAM: CHEST  2 VIEW  COMPARISON:  Prior radiograph from 06/13/2008  FINDINGS: The cardiac and mediastinal silhouettes are stable in size and contour, and remain within normal limits.  The lungs are normally inflated. No airspace consolidation, pleural effusion, or pulmonary edema is identified. There is no pneumothorax.  No acute osseous abnormality identified.  IMPRESSION: No active cardiopulmonary disease.   Electronically Signed   By: Rise MuBenjamin  McClintock M.D.   On: 08/14/2014 06:38     EKG Interpretation None      MDM   Final diagnoses:  Asthma exacerbation, mild   Patient nontoxic appearing and in no apparent distress. Vital signs stable. O2 sat 100% on room air. PERC negative, Wells 0. Doubt PE causing cough. CXR clear. Wheezing completely resolved with neb treatment. Stable for d/c, will d/c with albuterol inhaler. Resources given for PCP f/u. Return precautions given. Patient states understanding  of treatment care plan and is agreeable.  Trevor Mace, PA-C 08/14/14 [phone]

## 2014-08-14 NOTE — Discharge Instructions (Signed)
Use albuterol inhaler every 4-6 hours for cough and wheezing. Followup with the wellness clinic to establish care with a primary care physician.  Asthma, Acute Bronchospasm Acute bronchospasm caused by asthma is also referred to as an asthma attack. Bronchospasm means your air passages become narrowed. The narrowing is caused by inflammation and tightening of the muscles in the air tubes (bronchi) in your lungs. This can make it hard to breathe or cause you to wheeze and cough. CAUSES Possible triggers are:  Animal dander from the skin, hair, or feathers of animals.  Dust mites contained in house dust.  Cockroaches.  Pollen from trees or grass.  Mold.  Cigarette or tobacco smoke.  Air pollutants such as dust, household cleaners, hair sprays, aerosol sprays, paint fumes, strong chemicals, or strong odors.  Cold air or weather changes. Cold air may trigger inflammation. Winds increase molds and pollens in the air.  Strong emotions such as crying or laughing hard.  Stress.  Certain medicines such as aspirin or beta-blockers.  Sulfites in foods and drinks, such as dried fruits and wine.  Infections or inflammatory conditions, such as a flu, cold, or inflammation of the nasal membranes (rhinitis).  Gastroesophageal reflux disease (GERD). GERD is a condition where stomach acid backs up into your esophagus.  Exercise or strenuous activity. SIGNS AND SYMPTOMS   Wheezing.  Excessive coughing, particularly at night.  Chest tightness.  Shortness of breath. DIAGNOSIS  Your health care provider will ask you about your medical history and perform a physical exam. A chest X-ray or blood testing may be performed to look for other causes of your symptoms or other conditions that may have triggered your asthma attack. TREATMENT  Treatment is aimed at reducing inflammation and opening up the airways in your lungs. Most asthma attacks are treated with inhaled medicines. These include  quick relief or rescue medicines (such as bronchodilators) and controller medicines (such as inhaled corticosteroids). These medicines are sometimes given through an inhaler or a nebulizer. Systemic steroid medicine taken by mouth or given through an IV tube also can be used to reduce the inflammation when an attack is moderate or severe. Antibiotic medicines are only used if a bacterial infection is present.  HOME CARE INSTRUCTIONS   Rest.  Drink plenty of liquids. This helps the mucus to remain thin and be easily coughed up. Only use caffeine in moderation and do not use alcohol until you have recovered from your illness.  Do not smoke. Avoid being exposed to secondhand smoke.  You play a critical role in keeping yourself in good health. Avoid exposure to things that cause you to wheeze or to have breathing problems.  Keep your medicines up-to-date and available. Carefully follow your health care provider's treatment plan.  Take your medicine exactly as prescribed.  When pollen or pollution is bad, keep windows closed and use an air conditioner or go to places with air conditioning.  Asthma requires careful medical care. See your health care provider for a follow-up as advised. If you are more than [redacted] weeks pregnant and you were prescribed any new medicines, let your obstetrician know about the visit and how you are doing. Follow up with your health care provider as directed.  After you have recovered from your asthma attack, make an appointment with your outpatient doctor to talk about ways to reduce the likelihood of future attacks. If you do not have a doctor who manages your asthma, make an appointment with a primary care doctor to  discuss your asthma. SEEK IMMEDIATE MEDICAL CARE IF:   You are getting worse.  You have trouble breathing. If severe, call your local emergency services (911 in the [place]  You develop chest pain or discomfort.  You are vomiting.  You are not able to  keep fluids down.  You are coughing up yellow, green, brown, or bloody sputum.  You have a fever and your symptoms suddenly get worse.  You have trouble swallowing. MAKE SURE YOU:   Understand these instructions.  Will watch your condition.  Will get help right away if you are not doing well or get worse. Document Released: 02/11/2007 Document Revised: 11/01/2013 Document Reviewed: 05/04/2013 [facility] Patient Information 2015 [place], [place]. This information is not intended to replace advice given to you by your health care provider. Make sure you discuss any questions you have with your health care provider.  Asthma Asthma is a recurring condition in which the airways tighten and narrow. Asthma can make it difficult to breathe. It can cause coughing, wheezing, and shortness of breath. Asthma episodes, also called asthma attacks, range from minor to life-threatening. Asthma cannot be cured, but medicines and lifestyle changes can help control it. CAUSES Asthma is believed to be caused by inherited (genetic) and environmental factors, but its exact cause is unknown. Asthma may be triggered by allergens, lung infections, or irritants in the air. Asthma triggers are different for each person. Common triggers include:   Animal dander.  Dust mites.  Cockroaches.  Pollen from trees or grass.  Mold.  Smoke.  Air pollutants such as dust, household cleaners, hair sprays, aerosol sprays, paint fumes, strong chemicals, or strong odors.  Cold air, weather changes, and winds (which increase molds and pollens in the air).  Strong emotional expressions such as crying or laughing hard.  Stress.  Certain medicines (such as aspirin) or types of drugs (such as beta-blockers).  Sulfites in foods and drinks. Foods and drinks that may contain sulfites include dried fruit, potato chips, and sparkling grape juice.  Infections or inflammatory conditions such as the flu, a cold, or an  inflammation of the nasal membranes (rhinitis).  Gastroesophageal reflux disease (GERD).  Exercise or strenuous activity. SYMPTOMS Symptoms may occur immediately after asthma is triggered or many hours later. Symptoms include:  Wheezing.  Excessive nighttime or early morning coughing.  Frequent or severe coughing with a common cold.  Chest tightness.  Shortness of breath. DIAGNOSIS  The diagnosis of asthma is made by a review of your medical history and a physical exam. Tests may also be performed. These may include:  Lung function studies. These tests show how much air you breathe in and out.  Allergy tests.  Imaging tests such as X-rays. TREATMENT  Asthma cannot be cured, but it can usually be controlled. Treatment involves identifying and avoiding your asthma triggers. It also involves medicines. There are 2 classes of medicine used for asthma treatment:   Controller medicines. These prevent asthma symptoms from occurring. They are usually taken every day.  Reliever or rescue medicines. These quickly relieve asthma symptoms. They are used as needed and provide short-term relief. Your health care provider will help you create an asthma action plan. An asthma action plan is a written plan for managing and treating your asthma attacks. It includes a list of your asthma triggers and how they may be avoided. It also includes information on when medicines should be taken and when their dosage should be changed. An action plan may also involve  the use of a device called a peak flow meter. A peak flow meter measures how well the lungs are working. It helps you monitor your condition. HOME CARE INSTRUCTIONS   Take medicines only as directed by your health care provider. Speak with your health care provider if you have questions about how or when to take the medicines.  Use a peak flow meter as directed by your health care provider. Record and keep track of readings.  Understand and  use the action plan to help minimize or stop an asthma attack without needing to seek medical care.  Control your home environment in the following ways to help prevent asthma attacks:  Do not smoke. Avoid being exposed to secondhand smoke.  Change your heating and air conditioning filter regularly.  Limit your use of fireplaces and wood stoves.  Get rid of pests (such as roaches and mice) and their droppings.  Throw away plants if you see mold on them.  Clean your floors and dust regularly. Use unscented cleaning products.  Try to have someone else vacuum for you regularly. Stay out of rooms while they are being vacuumed and for a short while afterward. If you vacuum, use a dust mask from a hardware store, a double-layered or microfilter vacuum cleaner bag, or a vacuum cleaner with a HEPA filter.  Replace carpet with wood, tile, or vinyl flooring. Carpet can trap dander and dust.  Use allergy-proof pillows, mattress covers, and box spring covers.  Wash bed sheets and blankets every week in hot water and dry them in a dryer.  Use blankets that are made of polyester or cotton.  Clean bathrooms and kitchens with bleach. If possible, have someone repaint the walls in these rooms with mold-resistant paint. Keep out of the rooms that are being cleaned and painted.  Wash hands frequently. SEEK MEDICAL CARE IF:   You have wheezing, shortness of breath, or a cough even if taking medicine to prevent attacks.  The colored mucus you cough up (sputum) is thicker than usual.  Your sputum changes from clear or white to yellow, green, gray, or bloody.  You have any problems that may be related to the medicines you are taking (such as a rash, itching, swelling, or trouble breathing).  You are using a reliever medicine more than 2-3 times per week.  Your peak flow is still at 50-79% of your personal best after following your action plan for 1 hour.  You have a fever. SEEK IMMEDIATE MEDICAL  CARE IF:   You seem to be getting worse and are unresponsive to treatment during an asthma attack.  You are short of breath even at rest.  You get short of breath when doing very little physical activity.  You have difficulty eating, drinking, or talking due to asthma symptoms.  You develop chest pain.  You develop a fast heartbeat.  You have a bluish color to your lips or fingernails.  You are light-headed, dizzy, or faint.  Your peak flow is less than 50% of your personal best. MAKE SURE YOU:   Understand these instructions.  Will watch your condition.  Will get help right away if you are not doing well or get worse. Document Released: 10/27/2005 Document Revised: 03/13/2014 Document Reviewed: 05/26/2013 [facility] Patient Information 2015 [place], [place]. This information is not intended to replace advice given to you by your health care provider. Make sure you discuss any questions you have with your health care provider.  Asthma Attack Prevention Although there  is no way to prevent asthma from starting, you can take steps to control the disease and reduce its symptoms. Learn about your asthma and how to control it. Take an active role to control your asthma by working with your health care provider to create and follow an asthma action plan. An asthma action plan guides you in:  Taking your medicines properly.  Avoiding things that set off your asthma or make your asthma worse (asthma triggers).  Tracking your level of asthma control.  Responding to worsening asthma.  Seeking emergency care when needed. To track your asthma, keep records of your symptoms, check your peak flow number using a handheld device that shows how well air moves out of your lungs (peak flow meter), and get regular asthma checkups.  WHAT ARE SOME WAYS TO PREVENT AN ASTHMA ATTACK?  Take medicines as directed by your health care provider.  Keep track of your asthma symptoms and level of  control.  With your health care provider, write a detailed plan for taking medicines and managing an asthma attack. Then be sure to follow your action plan. Asthma is an ongoing condition that needs regular monitoring and treatment.  Identify and avoid asthma triggers. Many outdoor allergens and irritants (such as pollen, mold, cold air, and air pollution) can trigger asthma attacks. Find out what your asthma triggers are and take steps to avoid them.  Monitor your breathing. Learn to recognize warning signs of an attack, such as coughing, wheezing, or shortness of breath. Your lung function may decrease before you notice any signs or symptoms, so regularly measure and record your peak airflow with a home peak flow meter.  Identify and treat attacks early. If you act quickly, you are less likely to have a severe attack. You will also need less medicine to control your symptoms. When your peak flow measurements decrease and alert you to an upcoming attack, take your medicine as instructed and immediately stop any activity that may have triggered the attack. If your symptoms do not improve, get medical help.  Pay attention to increasing quick-relief inhaler use. If you find yourself relying on your quick-relief inhaler, your asthma is not under control. See your health care provider about adjusting your treatment. WHAT CAN MAKE MY SYMPTOMS WORSE? A number of common things can set off or make your asthma symptoms worse and cause temporary increased inflammation of your airways. Keep track of your asthma symptoms for several weeks, detailing all the environmental and emotional factors that are linked with your asthma. When you have an asthma attack, go back to your asthma diary to see which factor, or combination of factors, might have contributed to it. Once you know what these factors are, you can take steps to control many of them. If you have allergies and asthma, it is important to take asthma  prevention steps at home. Minimizing contact with the substance to which you are allergic will help prevent an asthma attack. Some triggers and ways to avoid these triggers are: Animal Dander:  Some people are allergic to the flakes of skin or dried saliva from animals with fur or feathers.   There is no such thing as a hypoallergenic dog or cat breed. All dogs or cats can cause allergies, even if they don't shed.  Keep these pets out of your home.  If you are not able to keep a pet outdoors, keep the pet out of your bedroom and other sleeping areas at all times, and keep the  door closed.  Remove carpets and furniture covered with cloth from your home. If that is not possible, keep the pet away from fabric-covered furniture and carpets. Dust Mites: Many people with asthma are allergic to dust mites. Dust mites are tiny bugs that are found in every home in mattresses, pillows, carpets, fabric-covered furniture, bedcovers, clothes, stuffed toys, and other fabric-covered items.   Cover your mattress in a special dust-proof cover.  Cover your pillow in a special dust-proof cover, or wash the pillow each week in hot water. Water must be hotter than 130 F (54.4 C) to kill dust mites. Cold or warm water used with detergent and bleach can also be effective.  Wash the sheets and blankets on your bed each week in hot water.  Try not to sleep or lie on cloth-covered cushions.  Call ahead when traveling and ask for a smoke-free hotel room. Bring your own bedding and pillows in case the hotel only supplies feather pillows and down comforters, which may contain dust mites and cause asthma symptoms.  Remove carpets from your bedroom and those laid on concrete, if you can.  Keep stuffed toys out of the bed, or wash the toys weekly in hot water or cooler water with detergent and bleach. Cockroaches: Many people with asthma are allergic to the droppings and remains of cockroaches.   Keep food and  garbage in closed containers. Never leave food out.  Use poison baits, traps, powders, gels, or paste (for example, boric acid).  If a spray is used to kill cockroaches, stay out of the room until the odor goes away. Indoor Mold:  Fix leaky faucets, pipes, or other sources of water that have mold around them.  Clean floors and moldy surfaces with a fungicide or diluted bleach.  Avoid using humidifiers, vaporizers, or swamp coolers. These can spread molds through the air. Pollen and Outdoor Mold:  When pollen or mold spore counts are high, try to keep your windows closed.  Stay indoors with windows closed from late morning to afternoon. Pollen and some mold spore counts are highest at that time.  Ask your health care provider whether you need to take anti-inflammatory medicine or increase your dose of the medicine before your allergy season starts. Other Irritants to Avoid:  Tobacco smoke is an irritant. If you smoke, ask your health care provider how you can quit. Ask family members to quit smoking, too. Do not allow smoking in your home or car.  If possible, do not use a wood-burning stove, kerosene heater, or fireplace. Minimize exposure to all sources of smoke, including incense, candles, fires, and fireworks.  Try to stay away from strong odors and sprays, such as perfume, talcum powder, hair spray, and paints.  Decrease humidity in your home and use an indoor air cleaning device. Reduce indoor humidity to below 60%. Dehumidifiers or central air conditioners can do this.  Decrease house dust exposure by changing furnace and air cooler filters frequently.  Try to have someone else vacuum for you once or twice a week. Stay out of rooms while they are being vacuumed and for a short while afterward.  If you vacuum, use a dust mask from a hardware store, a double-layered or microfilter vacuum cleaner bag, or a vacuum cleaner with a HEPA filter.  Sulfites in foods and beverages can  be irritants. Do not drink beer or wine or eat dried fruit, processed potatoes, or shrimp if they cause asthma symptoms.  Cold air can trigger an  asthma attack. Cover your nose and mouth with a scarf on cold or windy days.  Several health conditions can make asthma more difficult to manage, including a runny nose, sinus infections, reflux disease, psychological stress, and sleep apnea. Work with your health care provider to manage these conditions.  Avoid close contact with people who have a respiratory infection such as a cold or the flu, since your asthma symptoms may get worse if you catch the infection. Wash your hands thoroughly after touching items that may have been handled by people with a respiratory infection.  Get a flu shot every year to protect against the flu virus, which often makes asthma worse for days or weeks. Also get a pneumonia shot if you have not previously had one. Unlike the flu shot, the pneumonia shot does not need to be given yearly. Medicines:  Talk to your health care provider about whether it is safe for you to take aspirin or non-steroidal anti-inflammatory medicines (NSAIDs). In a small number of people with asthma, aspirin and NSAIDs can cause asthma attacks. These medicines must be avoided by people who have known aspirin-sensitive asthma. It is important that people with aspirin-sensitive asthma read labels of all over-the-counter medicines used to treat pain, colds, coughs, and fever.  Beta-blockers and ACE inhibitors are other medicines you should discuss with your health care provider. HOW CAN I FIND OUT WHAT I AM ALLERGIC TO? Ask your asthma health care provider about allergy skin testing or blood testing (the RAST test) to identify the allergens to which you are sensitive. If you are found to have allergies, the most important thing to do is to try to avoid exposure to any allergens that you are sensitive to as much as possible. Other treatments for allergies,  such as medicines and allergy shots (immunotherapy) are available.  CAN I EXERCISE? Follow your health care provider's advice regarding asthma treatment before exercising. It is important to maintain a regular exercise program, but vigorous exercise or exercise in cold, humid, or dry environments can cause asthma attacks, especially for those people who have exercise-induced asthma. Document Released: 10/15/2009 Document Revised: 11/01/2013 Document Reviewed: 05/04/2013 [facility] Patient Information 2015 [place], [place]. This information is not intended to replace advice given to you by your health care provider. Make sure you discuss any questions you have with your health care provider.

## 2014-08-14 NOTE — ED Notes (Signed)
Bed: [id number] Expected date:  Expected time:  Means of arrival:  Comments: EMS/38 yo male with cough

## 2014-08-14 NOTE — ED Notes (Signed)
Per EMS, pt called 911, due to a cough. He states the last time he had cough, he had PNA.

## 2014-08-14 NOTE — ED Notes (Signed)
Pt states he had an asthma attack on yesterday. He does not take medications for asthma. Has had chills and cough x 3 days.

## 2014-08-15 ENCOUNTER — Emergency Department (HOSPITAL_COMMUNITY): Payer: Self-pay

## 2014-08-15 ENCOUNTER — Encounter (HOSPITAL_COMMUNITY): Payer: Self-pay | Admitting: Emergency Medicine

## 2014-08-15 DIAGNOSIS — R918 Other nonspecific abnormal finding of lung field: Secondary | ICD-10-CM | POA: Insufficient documentation

## 2014-08-15 DIAGNOSIS — F1510 Other stimulant abuse, uncomplicated: Secondary | ICD-10-CM | POA: Insufficient documentation

## 2014-08-15 DIAGNOSIS — J45909 Unspecified asthma, uncomplicated: Secondary | ICD-10-CM | POA: Insufficient documentation

## 2014-08-15 DIAGNOSIS — G8929 Other chronic pain: Secondary | ICD-10-CM | POA: Insufficient documentation

## 2014-08-15 DIAGNOSIS — R0602 Shortness of breath: Secondary | ICD-10-CM | POA: Insufficient documentation

## 2014-08-15 DIAGNOSIS — R079 Chest pain, unspecified: Secondary | ICD-10-CM | POA: Insufficient documentation

## 2014-08-15 DIAGNOSIS — I493 Ventricular premature depolarization: Secondary | ICD-10-CM | POA: Insufficient documentation

## 2014-08-15 DIAGNOSIS — F17210 Nicotine dependence, cigarettes, uncomplicated: Secondary | ICD-10-CM | POA: Insufficient documentation

## 2014-08-15 DIAGNOSIS — F1123 Opioid dependence with withdrawal: Secondary | ICD-10-CM | POA: Insufficient documentation

## 2014-08-15 DIAGNOSIS — I951 Orthostatic hypotension: Secondary | ICD-10-CM | POA: Insufficient documentation

## 2014-08-15 DIAGNOSIS — B9561 Methicillin susceptible Staphylococcus aureus infection as the cause of diseases classified elsewhere: Principal | ICD-10-CM | POA: Insufficient documentation

## 2014-08-15 DIAGNOSIS — M549 Dorsalgia, unspecified: Secondary | ICD-10-CM | POA: Insufficient documentation

## 2014-08-15 LAB — BASIC METABOLIC PANEL
Anion gap: 12 (ref 5–15)
BUN: 11 mg/dL (ref 6–23)
CHLORIDE: 95 meq/L — AB (ref 96–112)
CO2: 28 mEq/L (ref 19–32)
Calcium: 9.3 mg/dL (ref 8.4–10.5)
Creatinine, Ser: 0.92 mg/dL (ref 0.50–1.35)
GFR calc non Af Amer: >90 mL/min (ref >90)
Glucose, Bld: 104 mg/dL — ABNORMAL HIGH (ref 70–99)
POTASSIUM: 4.3 meq/L (ref 3.7–5.3)
Sodium: 135 mEq/L — ABNORMAL LOW (ref 137–147)

## 2014-08-15 LAB — CBC
HCT: 41.6 % (ref 39.0–52.0)
Hemoglobin: 14.4 g/dL (ref 13.0–17.0)
MCH: 29.9 pg (ref 26.0–34.0)
MCHC: 34.6 g/dL (ref 30.0–36.0)
MCV: 86.5 fL (ref 78.0–100.0)
Platelets: 168 10*3/uL (ref 150–400)
RBC: 4.81 MIL/uL (ref 4.22–5.81)
RDW: 13.1 % (ref 11.5–15.5)
WBC: 10 10*3/uL (ref 4.0–10.5)

## 2014-08-15 LAB — I-STAT TROPONIN, ED: TROPONIN I, POC: 0 ng/mL (ref 0.00–0.08)

## 2014-08-15 NOTE — ED Notes (Addendum)
Presents with cough, chills and chest pain that radiates out into left arm, left neck. Cough is productive yellow/green sputum. Endorses chills. Breath sounds diminished. Pain is worse with movement and deep breaths. sats 100% RA. [facility] and the medication given to him is not helping

## 2014-08-16 ENCOUNTER — Emergency Department (HOSPITAL_COMMUNITY): Payer: Self-pay

## 2014-08-16 ENCOUNTER — Encounter (HOSPITAL_COMMUNITY): Payer: Self-pay

## 2014-08-16 ENCOUNTER — Observation Stay (HOSPITAL_COMMUNITY)
Admission: EM | Admit: 2014-08-16 | Discharge: 2014-08-17 | Disposition: A | Discharge status: Home / Self Care | Payer: Self-pay | Attending: Internal Medicine | Admitting: Internal Medicine

## 2014-08-16 DIAGNOSIS — M545 Low back pain: Secondary | ICD-10-CM

## 2014-08-16 DIAGNOSIS — R0600 Dyspnea, unspecified: Secondary | ICD-10-CM | POA: Diagnosis present

## 2014-08-16 DIAGNOSIS — M549 Dorsalgia, unspecified: Secondary | ICD-10-CM

## 2014-08-16 DIAGNOSIS — B9562 Methicillin resistant Staphylococcus aureus infection as the cause of diseases classified elsewhere: Secondary | ICD-10-CM

## 2014-08-16 DIAGNOSIS — J189 Pneumonia, unspecified organism: Secondary | ICD-10-CM

## 2014-08-16 DIAGNOSIS — R0682 Tachypnea, not elsewhere classified: Secondary | ICD-10-CM

## 2014-08-16 DIAGNOSIS — R0789 Other chest pain: Secondary | ICD-10-CM

## 2014-08-16 DIAGNOSIS — F1110 Opioid abuse, uncomplicated: Secondary | ICD-10-CM | POA: Diagnosis present

## 2014-08-16 DIAGNOSIS — G8929 Other chronic pain: Secondary | ICD-10-CM | POA: Diagnosis present

## 2014-08-16 DIAGNOSIS — F1123 Opioid dependence with withdrawal: Secondary | ICD-10-CM

## 2014-08-16 DIAGNOSIS — F1590 Other stimulant use, unspecified, uncomplicated: Secondary | ICD-10-CM

## 2014-08-16 DIAGNOSIS — R9389 Abnormal findings on diagnostic imaging of other specified body structures: Secondary | ICD-10-CM | POA: Diagnosis present

## 2014-08-16 DIAGNOSIS — R000 Tachycardia, unspecified: Secondary | ICD-10-CM

## 2014-08-16 DIAGNOSIS — F1910 Other psychoactive substance abuse, uncomplicated: Secondary | ICD-10-CM

## 2014-08-16 DIAGNOSIS — I493 Ventricular premature depolarization: Secondary | ICD-10-CM | POA: Diagnosis present

## 2014-08-16 DIAGNOSIS — F1510 Other stimulant abuse, uncomplicated: Secondary | ICD-10-CM | POA: Diagnosis present

## 2014-08-16 DIAGNOSIS — R079 Chest pain, unspecified: Secondary | ICD-10-CM | POA: Diagnosis present

## 2014-08-16 DIAGNOSIS — R7881 Bacteremia: Secondary | ICD-10-CM

## 2014-08-16 DIAGNOSIS — I951 Orthostatic hypotension: Secondary | ICD-10-CM | POA: Diagnosis present

## 2014-08-16 HISTORY — DX: Other cervical disc degeneration, unspecified cervical region: M50.30

## 2014-08-16 HISTORY — DX: Spondylosis, unspecified: M47.9

## 2014-08-16 LAB — LACTATE DEHYDROGENASE: LDH: 207 U/L (ref 94–250)

## 2014-08-16 LAB — I-STAT CG4 LACTIC ACID, ED: Lactic Acid, Venous: 1.35 mmol/L (ref 0.5–2.2)

## 2014-08-16 LAB — HEPATIC FUNCTION PANEL
ALT: 37 U/L (ref 0–53)
AST: 23 U/L (ref 0–37)
Albumin: 3.1 g/dL — ABNORMAL LOW (ref 3.5–5.2)
Alkaline Phosphatase: 90 U/L (ref 39–117)
Total Bilirubin: 0.8 mg/dL (ref 0.3–1.2)
Total Protein: 6.7 g/dL (ref 6.0–8.3)

## 2014-08-16 LAB — TSH: TSH: 0.684 u[IU]/mL (ref 0.350–4.500)

## 2014-08-16 LAB — RAPID URINE DRUG SCREEN, HOSP PERFORMED
Amphetamines: POSITIVE — AB
Barbiturates: NOT DETECTED
Benzodiazepines: NOT DETECTED
COCAINE: NOT DETECTED
Opiates: POSITIVE — AB
Tetrahydrocannabinol: NOT DETECTED

## 2014-08-16 LAB — HIV ANTIBODY (ROUTINE TESTING W REFLEX): HIV: NONREACTIVE

## 2014-08-16 LAB — TROPONIN I: Troponin I: <0.3 ng/mL (ref <0.30)

## 2014-08-16 LAB — D-DIMER, QUANTITATIVE: D-Dimer, Quant: 0.92 ug/mL-FEU — ABNORMAL HIGH (ref 0.00–0.48)

## 2014-08-16 MED ORDER — SODIUM CHLORIDE 0.9 % IJ SOLN
3.0000 mL | Freq: Two times a day (BID) | INTRAMUSCULAR | Status: DC
  Administered 2014-08-16: 3 mL via INTRAVENOUS

## 2014-08-16 MED ORDER — LORAZEPAM 2 MG/ML IJ SOLN
1.0000 mg | Freq: Once | INTRAMUSCULAR | Status: AC
  Administered 2014-08-16: 1 mg via INTRAVENOUS
  Filled 2014-08-16: qty 1

## 2014-08-16 MED ORDER — HEPARIN SODIUM (PORCINE) 5000 UNIT/ML IJ SOLN
5000.0000 [IU] | Freq: Three times a day (TID) | INTRAMUSCULAR | Status: DC
  Administered 2014-08-16 (×2): 5000 [IU] via SUBCUTANEOUS
  Filled 2014-08-16: qty 1

## 2014-08-16 MED ORDER — SODIUM CHLORIDE 0.9 % IV BOLUS (SEPSIS)
1000.0000 mL | Freq: Once | INTRAVENOUS | Status: AC
  Administered 2014-08-16: 1000 mL via INTRAVENOUS

## 2014-08-16 MED ORDER — MORPHINE SULFATE 2 MG/ML IJ SOLN
1.0000 mg | INTRAMUSCULAR | Status: DC | PRN
  Administered 2014-08-16: 1 mg via INTRAVENOUS
  Filled 2014-08-16: qty 1

## 2014-08-16 MED ORDER — LORAZEPAM 2 MG/ML IJ SOLN
1.0000 mg | Freq: Four times a day (QID) | INTRAMUSCULAR | Status: DC | PRN
  Administered 2014-08-16 (×2): 1 mg via INTRAVENOUS
  Filled 2014-08-16 (×3): qty 1

## 2014-08-16 MED ORDER — DEXTROSE 5 % IV SOLN
500.0000 mg | Freq: Once | INTRAVENOUS | Status: AC
  Administered 2014-08-16: 500 mg via INTRAVENOUS
  Filled 2014-08-16: qty 500

## 2014-08-16 MED ORDER — IOHEXOL 350 MG/ML SOLN
100.0000 mL | Freq: Once | INTRAVENOUS | Status: AC | PRN
  Administered 2014-08-16: 100 mL via INTRAVENOUS

## 2014-08-16 MED ORDER — MORPHINE SULFATE 4 MG/ML IJ SOLN
4.0000 mg | Freq: Once | INTRAMUSCULAR | Status: AC
  Administered 2014-08-16: 4 mg via INTRAVENOUS
  Filled 2014-08-16: qty 1

## 2014-08-16 MED ORDER — PNEUMOCOCCAL VAC POLYVALENT 25 MCG/0.5ML IJ INJ: 0.5000 mL | INJECTION | INTRAMUSCULAR | Status: DC

## 2014-08-16 MED ORDER — AZITHROMYCIN 250 MG PO TABS
250.0000 mg | ORAL_TABLET | Freq: Every day | ORAL | Status: DC
  Filled 2014-08-16: qty 1

## 2014-08-16 MED ORDER — HYDROMORPHONE HCL 1 MG/ML IJ SOLN
2.0000 mg | INTRAMUSCULAR | Status: DC | PRN
  Administered 2014-08-16 – 2014-08-17 (×3): 2 mg via INTRAVENOUS
  Filled 2014-08-16 (×3): qty 2

## 2014-08-16 MED ORDER — MORPHINE SULFATE 2 MG/ML IJ SOLN: 1.0000 mg | Freq: Four times a day (QID) | INTRAMUSCULAR | Status: DC | PRN

## 2014-08-16 MED ORDER — SODIUM CHLORIDE 0.9 % IV SOLN
INTRAVENOUS | Status: DC
  Administered 2014-08-16: 13:00:00 via INTRAVENOUS

## 2014-08-16 MED ORDER — IPRATROPIUM BROMIDE 0.02 % IN SOLN: 0.5000 mg | Freq: Four times a day (QID) | RESPIRATORY_TRACT | Status: DC

## 2014-08-16 MED ORDER — ASPIRIN EC 81 MG PO TBEC
81.0000 mg | DELAYED_RELEASE_TABLET | Freq: Every day | ORAL | Status: DC
  Filled 2014-08-16 (×2): qty 1

## 2014-08-16 MED ORDER — ALBUTEROL SULFATE (2.5 MG/3ML) 0.083% IN NEBU: 2.5000 mg | INHALATION_SOLUTION | Freq: Four times a day (QID) | RESPIRATORY_TRACT | Status: DC | PRN

## 2014-08-16 MED ORDER — PROMETHAZINE HCL 25 MG PO TABS: 12.5000 mg | ORAL_TABLET | Freq: Four times a day (QID) | ORAL | Status: DC | PRN

## 2014-08-16 MED ORDER — ALBUTEROL SULFATE (2.5 MG/3ML) 0.083% IN NEBU: 2.5000 mg | INHALATION_SOLUTION | Freq: Four times a day (QID) | RESPIRATORY_TRACT | Status: DC

## 2014-08-16 MED ORDER — VANCOMYCIN HCL IN DEXTROSE 1-5 GM/200ML-% IV SOLN
1000.0000 mg | Freq: Three times a day (TID) | INTRAVENOUS | Status: DC
  Administered 2014-08-16 – 2014-08-17 (×2): 1000 mg via INTRAVENOUS
  Filled 2014-08-16 (×3): qty 200

## 2014-08-16 MED ORDER — TRAZODONE HCL 50 MG PO TABS
50.0000 mg | ORAL_TABLET | Freq: Every evening | ORAL | Status: DC | PRN
  Filled 2014-08-16: qty 1

## 2014-08-16 MED ORDER — KETOROLAC TROMETHAMINE 30 MG/ML IJ SOLN
30.0000 mg | Freq: Once | INTRAMUSCULAR | Status: AC
  Administered 2014-08-16: 30 mg via INTRAVENOUS
  Filled 2014-08-16: qty 1

## 2014-08-16 MED ORDER — FENTANYL CITRATE 0.05 MG/ML IJ SOLN
50.0000 ug | Freq: Once | INTRAMUSCULAR | Status: AC
  Administered 2014-08-16: 50 ug via INTRAVENOUS
  Filled 2014-08-16: qty 2

## 2014-08-16 MED ORDER — IPRATROPIUM-ALBUTEROL 0.5-2.5 (3) MG/3ML IN SOLN
3.0000 mL | Freq: Four times a day (QID) | RESPIRATORY_TRACT | Status: DC
  Administered 2014-08-16 – 2014-08-17 (×3): 3 mL via RESPIRATORY_TRACT
  Filled 2014-08-16 (×4): qty 3

## 2014-08-16 MED ORDER — ALBUTEROL SULFATE HFA 108 (90 BASE) MCG/ACT IN AERS: 1.0000 | INHALATION_SPRAY | Freq: Four times a day (QID) | RESPIRATORY_TRACT | Status: DC | PRN

## 2014-08-16 MED ORDER — METOPROLOL TARTRATE 25 MG PO TABS
25.0000 mg | ORAL_TABLET | Freq: Two times a day (BID) | ORAL | Status: DC
  Administered 2014-08-16 (×2): 25 mg via ORAL
  Filled 2014-08-16 (×4): qty 1

## 2014-08-16 NOTE — H&P (Signed)
Date: 08/16/2014               Patient Name:  Kyle [name] MRN: [id number]  DOB: Aug 09, 1976 Age / Sex: 38 y.o., male   PCP: No Pcp Per Patient         Medical Service: Internal Medicine Teaching Service         Attending Physician: Dr. Cliffton Asters, MD    First Contact: Dr. Loma Newton Pager: [phone]  Second Contact: Dr. Andrey Campanile Pager: [phone]       After Hours (After 5p/  First Contact Pager: [phone]  weekends / holidays): Second Contact Pager: [phone]   Chief Complaint: Shortness of breath, cough, chest pain  History of Present Illness:  Ms. Kyle [name] is a 38 year old man with PMH of polysubstance abuse (with IV heroin use and amphetamine use), chronic back pain s/p lumbar laminectomy in 2009, hx of symptomatic PVCs (previously seen by Dr. Graciela Husbands and Dr. Antoine Poche but has not followed up), and asthma, who presented to the [facility] for cough productive of green/yellow sputum, chills, and increased shortness of breath. He reports last using IV heroin 3 days ago. He reports that he had amphetamines 3 days ago and that was his first time using it. He denies recent alcohol use. He denies tobacco use. He had an asthma attack earlier this week and was seen here at the ED. He was given nebulizer treatment and albuterol inhaler at that time however, his cough worsened with "spells" of cough and shortness of breath. He also starting feeling chest tightness and soreness in his bilateral ribs and his right chest that radiated to his right arm and jaw. He does not use an inhaler every day and has never been hospitalized for an asthma flare up.   In the ED he was found to be tachycardic with HR of 108 and PE was considered. His D-dimer was elevated to 0.92, a CT Chest angiogram was negative for PE but revealed multi focal ill-defined ground-glass nodular opacities within the bilateral lungs concerning for sequale of atypical infection. He is not given nebulizer treatment but continued to maintain oxygen saturation of  95-100% on RA though he was tachypnic at times with severe cough. TSH was within normal range of 0.684. UDS was positive for opiates and amphetamines. His poc troponin was negative and his EKG had sinus tachycardia with multiple PVCs but no ST changes. He received Azithromycin 500mg  IV for atypical pneumonia. The IMTS team was called for his hospitalization for further evaluation and treatment of his symptoms.   Medications Prior to Admission  Medication Sig Dispense Refill  . albuterol (PROVENTIL HFA;VENTOLIN HFA) 108 (90 BASE) MCG/ACT inhaler Inhale 2 puffs into the lungs every 6 (six) hours as needed for wheezing or shortness of breath.        Meds: Current Facility-Administered Medications  Medication Dose Route Frequency Provider Last Rate Last Dose  . 0.9 %  sodium chloride infusion   Intravenous Continuous Ky Barban, MD      . aspirin EC tablet 81 mg  81 mg Oral Daily Ky Barban, MD      . Melene Muller ON 08/17/2014] azithromycin (ZITHROMAX) tablet 250 mg  250 mg Oral Daily Ky Barban, MD      . heparin injection 5,000 Units  5,000 Units Subcutaneous 3 times per day Ky Barban, MD   5,000 Units at 08/16/14 1043  . HYDROmorphone (DILAUDID) injection 2 mg  2 mg Intravenous Q2H PRN Ky Barban,  MD      . ipratropium-albuterol (DUONEB) 0.5-2.5 (3) MG/3ML nebulizer solution 3 mL  3 mL Nebulization Q6H Cliffton Asters, MD      . LORazepam (ATIVAN) injection 1 mg  1 mg Intravenous Q6H PRN Ky Barban, MD      . morphine 2 MG/ML injection 1 mg  1 mg Intravenous Q4H PRN Ky Barban, MD   1 mg at 08/16/14 1043  . promethazine (PHENERGAN) tablet 12.5 mg  12.5 mg Oral Q6H PRN Ky Barban, MD      . sodium chloride 0.9 % bolus 1,000 mL  1,000 mL Intravenous Once Ky Barban, MD      . sodium chloride 0.9 % injection 3 mL  3 mL Intravenous Q12H Ky Barban, MD   3 mL at 08/16/14 1044  . traZODone (DESYREL) tablet 50 mg  50 mg  Oral QHS PRN Ky Barban, MD        Allergies: Allergies as of 08/15/2014 - Review Complete 08/15/2014  Allergen Reaction Noted  . Penicillins Anaphylaxis 08/31/2008  . Banana  03/07/2014  . Carrot [daucus carota]  03/07/2014  . Celery oil  03/07/2014  . Other  03/07/2014   Past Medical History  Diagnosis Date  . Asthma   . DDD (degenerative disc disease), cervical   . Spondylosis    Past Surgical History  Procedure Laterality Date  . Spine surgery     History reviewed. No pertinent family history. History   Social History  . Marital Status: Single    Spouse Name: N/A    Number of Children: N/A  . Years of Education: N/A   Occupational History  . Not on file.   Social History Main Topics  . Smoking status: Current Every Day Smoker -- 0.50 packs/day    Types: Cigarettes  . Smokeless tobacco: Never Used  . Alcohol Use: No  . Drug Use: No  . Sexual Activity: Not on file   Other Topics Concern  . Not on file   Social History Narrative  . No narrative on file    Review of Systems: Review of Systems  Constitutional: Positive for fever and chills. Negative for malaise/fatigue and diaphoresis.  HENT: Negative for congestion, ear pain and sore throat.   Eyes: Negative for pain.  Respiratory: Positive for cough, sputum production, shortness of breath and wheezing. Negative for hemoptysis.   Cardiovascular: Positive for chest pain and palpitations. Negative for leg swelling.  Gastrointestinal: Negative for nausea, vomiting and abdominal pain.  Genitourinary: Negative for dysuria and frequency.  Musculoskeletal: Negative for myalgias.  Skin: Negative for rash.  Neurological: Positive for dizziness. Negative for weakness and headaches.  Psychiatric/Behavioral: Positive for substance abuse. Negative for depression and suicidal ideas. The patient is nervous/anxious.     Physical Exam: Blood pressure 112/83, pulse 113, temperature 100.2 F (37.9 C),  temperature source Oral, resp. rate 22, height 5\' 6"  (1.676 m), weight 145 lb (65.772 kg), SpO2 98.00%. Physical Exam  Nursing note and vitals reviewed. Constitutional: He is oriented to person, place, and time. He appears well-developed and well-nourished. No distress.  Tired appearing. Speaks in full sentences  HENT:  Mouth/Throat: Oropharynx is clear and moist. No oropharyngeal exudate.  Eyes: Conjunctivae are normal. No scleral icterus.  Cardiovascular: Exam reveals no gallop and no friction rub.   No murmur heard. tachycardia  Respiratory: Effort normal. No respiratory distress. He has wheezes. He has no rales. He exhibits tenderness.  TTP of right chest  and bilateral lower rib cages Faint expiratory wheezes at the lung bases  GI: Soft. Bowel sounds are normal. He exhibits no distension. There is no tenderness. There is no rebound and no guarding.  Musculoskeletal: He exhibits no edema and no tenderness.  Bilateral lower extremities are symmetric with no erythema or palpable cord  Neurological: He is alert and oriented to person, place, and time.  Skin: Skin is warm and dry. No rash noted. He is not diaphoretic. No erythema. No pallor.  Multiple tattoos in left forearm Well healed scars in left posterior shoulder Well healed surgical scar over low lumbar spine No splinter hemorrhages, Janeway lesion, Osler nodes.   Psychiatric: He has a normal mood and affect.  Anxious appearing     Lab results: Basic Metabolic Panel:  Recent Labs  16/10/96 2200  NA 135*  K 4.3  CL 95*  CO2 28  GLUCOSE 104*  BUN 11  CREATININE 0.92  CALCIUM 9.3   CBC:  Recent Labs  08/15/14 2200  WBC 10.0  HGB 14.4  HCT 41.6  MCV 86.5  PLT 168   D-Dimer:  Recent Labs  08/16/14 0106  DDIMER 0.92*   Thyroid Function Tests:  Recent Labs  08/16/14 0106  TSH 0.684   Urine Drug Screen: Drugs of Abuse     Component Value Date/Time   LABOPIA POSITIVE* 08/16/2014 0247   COCAINSCRNUR  NONE DETECTED 08/16/2014 0247   LABBENZ NONE DETECTED 08/16/2014 0247   AMPHETMU POSITIVE* 08/16/2014 0247   THCU NONE DETECTED 08/16/2014 0247   LABBARB NONE DETECTED 08/16/2014 0247     Imaging results:  Dg Chest 2 View  08/15/2014   CLINICAL DATA:  Chest pain and shortness of breath. Nonproductive cough.  EXAM: CHEST  2 VIEW  COMPARISON:  08/14/2014  FINDINGS: The heart size and mediastinal contours are within normal limits. Both lungs are clear. The visualized skeletal structures are unremarkable.  IMPRESSION: No active cardiopulmonary disease.   Electronically Signed   By: Geanie Cooley M.D.   On: 08/15/2014 22:23   Ct Angio Chest W/cm &/or Wo Cm  08/16/2014   CLINICAL DATA:  Initial evaluation for severe chest pain, cough. Evaluate for pulmonary embolism.  EXAM: CT ANGIOGRAPHY CHEST WITH CONTRAST  TECHNIQUE: Multidetector CT imaging of the chest was performed using the standard protocol during bolus administration of intravenous contrast. Multiplanar CT image reconstructions and MIPs were obtained to evaluate the vascular anatomy.  CONTRAST:  OMNIPAQUE IOHEXOL 350 MG/ML SOLN  COMPARISON:  Prior radiograph performed earlier on the same day.  FINDINGS: Thyroid gland within normal limits. No pathologically enlarged mediastinal, hilar, or axillary lymph nodes identified.  Intrathoracic aorta of normal caliber and appearance. Great vessels within normal limits. Heart size normal. No pericardial effusion.  Pulmonary arterial tree well opacified. No filling defect to suggest acute pulmonary embolism identified. Re-formatted imaging confirms these findings.  Ill defined ground-glass nodular opacity measuring approximately 1.1 cm present within the posterior right lung apex (series 7, image 18). There is a tiny 3 mm nodule more inferiorly within the right upper lobe (series 7, image 26). Additional 4 mm right upper lobe nodule (series 7, image 22). Additional ill-defined focal ground-glass opacity present  within the peripheral left upper lobe (series 7, image 33). Scattered ill-defined nodules measuring up to 8 mm present within the left upper lobe as well. Prominent 8 mm nodular opacity present within the lingula. There is a 9 mm nodular opacity along the left hemidiaphragm. 9 mm nodule  present within the left lower lobe (series 7, image 46). These findings are indeterminate, but may represent sequelae of underlying atypical or infection. No consolidative airspace opacity. No pulmonary edema or pleural effusion. Subsegmental atelectasis seen dependently within the lung bases.  Visualized portions of the upper abdomen are within normal limits.  No acute osseous abnormality. No worrisome lytic or blastic osseous lesions.  IMPRESSION: 1. No CT evidence of acute pulmonary embolism. 2. Multi focal ill-defined ground-glass nodular opacities within the bilateral lungs as detailed above. Findings are indeterminate, but may reflect sequelae of underlying atypical infection. Clinical followup to resolution recommended.   Electronically Signed   By: Rise MuBenjamin  McClintock M.D.   On: 08/16/2014 06:01    Other results: Date/Time: Wednesday August 16 2014 00:45:24 EDT  Ventricular Rate: 126  PR Interval: 112  QRS Duration: 96  QT Interval: 316  QTC Calculation: 457  Axis: no deviation Text Interpretation: Sinus tachycardia with frequent Premature ventricular complexes, right atrial enlargement, PVCs improved from EKG on 08/15/14.   Assessment & Plan by Problem: This is a 38 year old man with PMH of symptomatic PVCs, asthma, chronic back pain, polysubstance abuse (amphetamine and IV heroin), presenting with cough, subjective fever, increased shortness of breath and chest pain concerning for asthma exacerbation with possible atypical pneumonia.   Dyspnea: He has increased shortness of breath with no hypoxia. He has URI symptoms with cough productive of scant yellow/green sputum. Differential to include asthma  exacerbation (though mild with only faint expiratory wheezing that have resolved), atypical pneumonia (viral v bacterial), opportunistic infection ( in an IV drug user with HIV status), septic emboli (from possible right-sided endocarditis), and CHF (though he denies orthopnea, with no pulmonary edema). CT angio negative for PE but with findings of multifocal ill-defined ground-glass nodular opacities within the bilateral lungs which is suspicious for septic emboli or opportunistic infection. He tried albuterol inhaler at home but this did not improve his symptoms and his cough worsened. He is speaking in full sentences with no hypoxia.  -Admit to tele Obs -DuoNebs q6h PRN for SOB -Peak flow before and after treatments -Continue Azithromycin for possible atypical PNA -Albuterol inhaler PRN -f/u sputum cx and gram stain -f/u blood cultures x2 -Work up and empiric treatment for possible infective endocarditis per below  Ground-Glass opacity on chest CT:  He reports subjective fever/chills (Tmax here at 100.73F), cough productive of scant green/yellow sputum, and increased SOB in 48-72 hrs, with CXR with no consolidation or focal PNA. However, his CT chest angio has multi focal ill-defined ground-glass nodular opacities within the bilateral lungs which may reflect sequelae of underlying atypical infection. He is an IV drug user and right-sided infective endocarditis with septic emboli is possible. Mild PCP pneumonia is also possible if he is immune compromised though he is not hypoxic making it less likely.  -Empiric treatment for endocarditis with Vancomycin  -Continue azithromycin per above (for Mycoplasma Pneumoniae, Chlamydia pneumoniae) -HIV screen, will need viral load if negative as he is at risk of acute HIV -Start Bactrim if PCP pneumonia likely (f/u HIV, consider PCP PCR of sputum) -f/u blood culture, sputum cx and Gram stain -Ambulate patient while checking oxygen saturation  Orthostatic  Hypotension: Likely due to decreased intake per mouth x3 days and insensible fluid losses. He received 2L NS in the ED but he is still orthostatic with BP of 132/75 lying and 109/61 standing.  -NS 1L bolus once -NS 19150ml/h x12 h  Chest pain: Likely pleuritic though he  also has chest wall tenderness on physical exam. His TIMI score is zero. He does have history of symptomatic PVCs that cause chest palpitations/chest pain but is no longer on treatment for this condition. Infective endocarditis per above is possible as well.  -Empiric tx with Vancomycin for endocarditis per above -EKG PRN for chest pain -EKG in am -cycle troponin -2D echo per below  Symptomatic Premature Ventricular Contractions: He has documented history of frequent ventricular ectopy without sustained arrhythmias dating back to 2008 (per Epic). He underwent invasive electrophysiological study with arrhythmia mapping and pace mapping on 10/25/07 by Dr. Graciela Husbands which showed that the ectopy was emerging likely from the left ventricular outflow tract and coronary cusps and he was felt not to be a good candidate for ablation of that focus. He had Myoview in July 2008 with EF of 45-50% with mild diffuse hypokinesis.  He used to be seen by Dr. Graciela Husbands and Dr. Antoine Poche in Cardiology, was on multiple medications, including metoprolol, diltiazem, and propafenone for this problem but has not followed with Cardiology and is no longer on any of these medications. He states that his symptoms had improved until his recent use of amphetamines which may have worsened his PVCs.  -On telemetry -Serial EKGs and troponin cycling per above -Cardiology consult, pt will need to reestablish tx/care with them -2D echo   Heroin withdrawal: He uses IV heroin often and reports last use over 1 week ago. He is mildly anxious on physical exam with tachycardia which could be early signs of opiate withdrawal. Morphine 1mg  IV did not "touch" his pain, he reports having  adequate pain control with Dilaudid 4mg  q1h in the past.  -Dilaudid 2mg  q2hr PRN for pain  -Ativan 1mg  IV q6h PRN for anxiety -CSW consult for substance abuse counseling -Check HIV, Hep C  Amphetamine use: He reports that his first and last use of amphetamine was one week ago. He states that he understands that his "body cannot handle amphetamines". His UDS is positive for amphetamine which could still be affecting his HR.  -Ativan 1mg  IV PRN for anxiety -IVF with NS 122ml/hr -CSW for substance abuse counseling  Chronic back pain: He is s/p lumbar laminectomy in 2009 for lateral disk herniation that was complicated by wound infection requiring I&D, was followed by Dr. Franky Macho in Neurosurgery but he has not followed up. He has persistent low back pain that he treats with IV heroin. No LE paresthesia or weakness. No bowel/bladder retention. -Morphine 1mg  IV q4 PRN for pain  VTE prophylaxis: heparin [place] TID  FEN NSL 137ml/hr  Replete as needed Regular diet   Dispo: Disposition is deferred at this time, awaiting improvement of current medical problems. Anticipated discharge in approximately 1-2 day(s).   The patient does not have a current PCP (No Pcp Per Patient) and does need an [facility] hospital follow-up appointment after discharge.  The patient does not have transportation limitations that hinder transportation to clinic appointments.  Signed: Ky Barban, MD PGY-3, IMTS 08/16/2014, 11:25 AM

## 2014-08-16 NOTE — ED Notes (Signed)
Patient transported to CT 

## 2014-08-16 NOTE — ED Notes (Signed)
Pt reports pain in chest, arms and back along with sob. Pt states he has been taking the [facility]er[facility] with no relief. Pt rates pain 10/10 but did not take anything at home for the pain. Pt endorses he is coughing up green sputum.

## 2014-08-16 NOTE — Progress Notes (Signed)
MD on call made aware of pt's blood cultures resulting in gram positive cocci and clusters. MD aware and no further orders were given will continue to monitor.   Kyle [name]

## 2014-08-16 NOTE — ED Provider Notes (Addendum)
TIME SEEN: 1:34 AM  CHIEF COMPLAINT: Chest pain, cough, chills  HPI: Patient is a 38 year old male with history of asthma, ankylosing spondylitis, history of IV heroin abuse who presents to the emergency department with complaints of chest pain. He describes the pain as a burning, sharp pain that radiates to his right chest down his right arm and into his right jaw. It is worse with movement and palpation and also with deep inspiration. He reports he has had subjective fevers and chills at home for the past several days and a cough with green sputum production. He last used IV heroin one week ago and reports that he is "scared I may have done something to myself". Denies a prior history of endocarditis. He states he has a known history of PVCs but reports normally his heart rate is in the 50s. Denies any stimulant use. Denies heavy caffeine intake. Denies a prior history of PE or DVT. No recent prolonged immobilization, fracture, surgery, trauma. He has had associated shortness of breath. No nausea, vomiting or diarrhea. He has no history of prosthetic valves.  ROS: See HPI Constitutional:  fever  Eyes: no drainage  ENT: no runny nose   Cardiovascular:   chest pain  Resp:  SOB  GI: no vomiting GU: no dysuria Integumentary: no rash  Allergy: no hives  Musculoskeletal: no leg swelling  Neurological: no slurred speech ROS otherwise negative  PAST MEDICAL HISTORY/PAST SURGICAL HISTORY:  Past Medical History  Diagnosis Date  . Asthma   . DDD (degenerative disc disease), cervical   . Spondylosis     MEDICATIONS:  Prior to Admission medications   Medication Sig Start Date End Date Taking? Authorizing Provider  albuterol (PROVENTIL HFA;VENTOLIN HFA) 108 (90 BASE) MCG/ACT inhaler Inhale 2 puffs into the lungs every 6 (six) hours as needed for wheezing or shortness of breath.   Yes Historical Provider, MD    ALLERGIES:  Allergies  Allergen Reactions  . Penicillins Anaphylaxis  . Banana      Itchy throat   . Carrot [Daucus Carota]     Oral allergy syndrome   . Celery Oil     Oral allergy syndrome   . Other     Walnuts, pecans "oral allergy syndrome"    SOCIAL HISTORY:  History  Substance Use Topics  . Smoking status: Current Every Day Smoker -- 0.50 packs/day    Types: Cigarettes  . Smokeless tobacco: Never Used  . Alcohol Use: No    FAMILY HISTORY: History reviewed. No pertinent family history.  EXAM: BP 119/78  Pulse 110  Temp(Src) 98.2 F (36.8 C) (Oral)  Resp 21  SpO2 96% CONSTITUTIONAL: Alert and oriented and responds appropriately to questions. Well-appearing; well-nourished appears uncomfortable and anxious HEAD: Normocephalic EYES: Conjunctivae clear, PERRL ENT: normal nose; no rhinorrhea; moist mucous membranes; pharynx without lesions noted NECK: Supple, no meningismus, no LAD  CARD: Regular and tachycardic; S1 and S2 appreciated; no murmurs, no clicks, no rubs, no gallops RESP: Normal chest excursion without splinting or tachypnea; breath sounds clear and equal bilaterally; no wheezes, no rhonchi, no rales,  ABD/GI: Normal bowel sounds; non-distended; soft, non-tender, no rebound, no guarding BACK:  The back appears normal and is non-tender to palpation, there is no CVA tenderness EXT: Normal ROM in all joints; non-tender to palpation; no edema; normal capillary refill; no cyanosis; 2+ radial and DP pulses bilaterally, compartments soft, no calf tenderness    SKIN: Normal color for age and race; warm NEURO: Moves all extremities  equally PSYCH: The patient's mood and manner are appropriate. Grooming and personal hygiene are appropriate.  MEDICAL DECISION MAKING: Patient here with chest pain, chills and a subjective fevers, shortness of breath, cough. He has a history of IV drug use. Differential diagnosis includes endocarditis, chest wall pain, less likely ACS, pulmonary embolus, pneumonia. We'll obtain labs including troponin and d-dimer, blood  cultures, lactate, chest x-ray. Anticipate patient will need admission for observation while his blood cultures are pending and for an echocardiogram.   ED PROGRESS: Patient's labs are unremarkable other than elevated d-dimer. Will obtain a CT image of his chest. He has no leukocytosis. Lactate normal. TSH normal. Chest x-ray shows no infiltrate, edema or pneumothorax. Urine drug screen pending.   CT scan shows no pulmonary embolus. Patient does have multifocal ill-defined groundglass nodular opacities that could be sequela of underlying atypical infection. Given he has had a cough with green sputum production, will give azithromycin.    7:36 AM  Patient is still tachycardic and tachypneic after IV fluids, Toradol, fentanyl, morphine and Ativan. Discussed with internal medicine teaching service for admission to telemetry, observation for CP with possible endocarditis/bacteremia.  Pt stable, RR and RR improving with IVF and pain meds.  IM team asking to hold Vanc until blood culture results returned.    EKG Interpretation  Date/Time:  Wednesday August 16 2014 00:45:24 EDT Ventricular Rate:  126 PR Interval:  112 QRS Duration: 96 QT Interval:  316 QTC Calculation: 457 R Axis:   53 Text Interpretation:  Sinus tachycardia with frequent Premature ventricular complexes Right atrial enlargement Borderline ECG Confirmed by WARD,  DO, KRISTEN ([id number]) on 08/16/2014 1:41:37 AM        Layla Maw Ward, DO 08/16/14 0737  Kristen N Ward, DO 08/20/14 0117

## 2014-08-16 NOTE — ED Notes (Signed)
[phone]

## 2014-08-16 NOTE — ED Notes (Signed)
Breakfast tray ordered 

## 2014-08-16 NOTE — Progress Notes (Signed)
Report received from the ED at 0820 and pt arrived to the floor at 0900. Pt A&O x4 but very irritable and c/o pain upon arrival to the floor. Pt report generalized pain; pt informed will look at orders and bring him something for pain if due and pt voices understanding; pt oriented to the room and floor, call light within reach. Vitals taken as well as Othostatic vitals taken; MD aware of pt arrival to the floor; will continue to monitor pt quietly. Arabella MerlesP. Amo Cowan Pilar RN.

## 2014-08-16 NOTE — ED Notes (Signed)
Unable to carry out CT scan, pt uncooperative. MD informed. See new orders.

## 2014-08-16 NOTE — Clinical Social Work Note (Addendum)
CSW attempted to speak with patient regarding his polysubstance abuse but he asked that I come back another time- "I'm in so much pain I can hardly get enough air to breathe".  CSW will attempt again at another time.  Reece LevyJanet Yoshio Seliga, MSW, Theresia MajorsLCSWA [phone]

## 2014-08-16 NOTE — Consult Note (Signed)
CARDIOLOGY CONSULT NOTE   Patient ID: Kyle [name] MRN: [id number] DOB/AGE: [birth year] 38 y.o.  Admit Date: 08/16/2014  Primary Physician: No PCP Per Patient  Primary Cardiologist    Graciela Husbands  /  Diona Browner   Clinical Summary [name] is a 38 y.o.male. He is admitted with a cough, upper respiratory infection, heroin and amphetamine use. He has a history of symptomatic PVCs. This was evaluated fully in the past by our team. The patient has not been seen for followup since 2008. He has actually not been bothered by his PVCs lately until his amphetamine use.  Historically he has good left ventricular function. He had a full electrophysiology evaluation in December, 2008. At that time it was felt that his PVCs were from the left ventricular outflow tract and in the area of the coronary sinuses. It was felt that he was not a good candidate for ablation. Beta blockers and calcium blockers were used at that time. There is mention of propafenone. However I do not believe he was on this in 2008. Over time it is my understanding that all these medicines were stopped by the patient. There has not been recent followup.  Cardiology is now consult to reestablish the patient's cardiology care and assess his status.   Allergies  Allergen Reactions  . Penicillins Anaphylaxis  . Banana     Itchy throat   . Carrot [Daucus Carota]     Oral allergy syndrome   . Celery Oil     Oral allergy syndrome   . Other     Walnuts, pecans "oral allergy syndrome"    Medications Scheduled Medications: . aspirin EC  81 mg Oral Daily  . [START ON 08/17/2014] azithromycin  250 mg Oral Daily  . heparin  5,000 Units Subcutaneous 3 times per day  . ipratropium-albuterol  3 mL Nebulization Q6H  . sodium chloride  3 mL Intravenous Q12H     Infusions: . sodium chloride       PRN Medications:  albuterol, HYDROmorphone (DILAUDID) injection, LORazepam, promethazine, traZODone   Past Medical History    Diagnosis Date  . Asthma   . DDD (degenerative disc disease), cervical   . Spondylosis     Past Surgical History  Procedure Laterality Date  . Spine surgery      History reviewed. No pertinent family history.  Social History Mr. Beddow reports that he has been smoking Cigarettes.  He has been smoking about 0.50 packs per day. He has never used smokeless tobacco. Mr. Brockbank reports that he does not drink alcohol.  Review of Systems  Prior to admission the patient did have fevers and chills. He had some chest discomfort at times. He denies headache, rash, change in vision, change in hearing, nausea vomiting, urinary symptoms. He did have a cough.  Physical Examination Blood pressure 112/83, pulse 113, temperature 100.2 F (37.9 C), temperature source Oral, resp. rate 22, height 5\' 6"  (1.676 m), weight 145 lb (65.772 kg), SpO2 98.00%.  Intake/Output Summary (Last 24 hours) at 08/16/14 1233 Last data filed at 08/16/14 0901  Gross per 24 hour  Intake   2000 ml  Output    275 ml  Net   1725 ml   Today he is mildly agitated. Head is atraumatic. Lungs reveal scattered rhonchi. Cardiac exam reveals S1 and S2. The abdomen is soft. There is no significant peripheral edema. There no musculoskeletal abnormalities. He has tattoos.  Prior Cardiac Testing/Procedures  Lab Results  Basic Metabolic Panel:  Recent Labs Lab 08/15/14 2200  NA 135*  K 4.3  CL 95*  CO2 28  GLUCOSE 104*  BUN 11  CREATININE 0.92  CALCIUM 9.3    Liver Function Tests:  Recent Labs Lab 08/16/14 1030  AST 23  ALT 37  ALKPHOS 90  BILITOT 0.8  PROT 6.7  ALBUMIN 3.1*    CBC:  Recent Labs Lab 08/15/14 2200  WBC 10.0  HGB 14.4  HCT 41.6  MCV 86.5  PLT 168    Cardiac Enzymes:  Recent Labs Lab 08/16/14 1030  TROPONINI <0.30    BNP: No components found with this basename: POCBNP,    Radiology: Dg Chest 2 View  08/15/2014   CLINICAL DATA:  Chest pain and shortness of breath.  Nonproductive cough.  EXAM: CHEST  2 VIEW  COMPARISON:  08/14/2014  FINDINGS: The heart size and mediastinal contours are within normal limits. Both lungs are clear. The visualized skeletal structures are unremarkable.  IMPRESSION: No active cardiopulmonary disease.   Electronically Signed   By: Geanie Cooley M.D.   On: 08/15/2014 22:23   Ct Angio Chest W/cm &/or Wo Cm  08/16/2014   CLINICAL DATA:  Initial evaluation for severe chest pain, cough. Evaluate for pulmonary embolism.  EXAM: CT ANGIOGRAPHY CHEST WITH CONTRAST  TECHNIQUE: Multidetector CT imaging of the chest was performed using the standard protocol during bolus administration of intravenous contrast. Multiplanar CT image reconstructions and MIPs were obtained to evaluate the vascular anatomy.  CONTRAST:  OMNIPAQUE IOHEXOL 350 MG/ML SOLN  COMPARISON:  Prior radiograph performed earlier on the same day.  FINDINGS: Thyroid gland within normal limits. No pathologically enlarged mediastinal, hilar, or axillary lymph nodes identified.  Intrathoracic aorta of normal caliber and appearance. Great vessels within normal limits. Heart size normal. No pericardial effusion.  Pulmonary arterial tree well opacified. No filling defect to suggest acute pulmonary embolism identified. Re-formatted imaging confirms these findings.  Ill defined ground-glass nodular opacity measuring approximately 1.1 cm present within the posterior right lung apex (series 7, image 18). There is a tiny 3 mm nodule more inferiorly within the right upper lobe (series 7, image 26). Additional 4 mm right upper lobe nodule (series 7, image 22). Additional ill-defined focal ground-glass opacity present within the peripheral left upper lobe (series 7, image 33). Scattered ill-defined nodules measuring up to 8 mm present within the left upper lobe as well. Prominent 8 mm nodular opacity present within the lingula. There is a 9 mm nodular opacity along the left hemidiaphragm. 9 mm nodule  present within the left lower lobe (series 7, image 46). These findings are indeterminate, but may represent sequelae of underlying atypical or infection. No consolidative airspace opacity. No pulmonary edema or pleural effusion. Subsegmental atelectasis seen dependently within the lung bases.  Visualized portions of the upper abdomen are within normal limits.  No acute osseous abnormality. No worrisome lytic or blastic osseous lesions.  IMPRESSION: 1. No CT evidence of acute pulmonary embolism. 2. Multi focal ill-defined ground-glass nodular opacities within the bilateral lungs as detailed above. Findings are indeterminate, but may reflect sequelae of underlying atypical infection. Clinical followup to resolution recommended.   Electronically Signed   By: Rise Mu M.D.   On: 08/16/2014 06:01     ECG:   Initial EKGs revealed normal QRS. There were frequent PVCs.  Telemetry    I personally reviewed telemetry August 16, 2014. There sinus rhythm with mild sinus tachycardia. There are rare PVCs. There's been no evidence of  ventricular tachycardia.   Impression and Recommendations     Asthma exacerbation    The patient is currently being treated for the possibility of asthma exacerbation and possible pulmonary infection.    Chest pain    At this time there is no evidence of ischemic chest pain. 2-D echo will be done to be sure that he has not developed left ventricular dysfunction.    Heroin withdrawal   Amphetamine abuse    He is withdrawing from heroin and also from recent use of amphetamines.    Symptomatic premature ventricular contractions     Historically the patient had an extensive evaluation. His PVCs come from the left ventricular outflow tract and from around the coronary artery ostia. It was felt that ablation was not appropriate in 2008. Over time he has stopped his medicines and has not been bothered by palpitations. Palpitations became worse when he started amphetamine  several days ago. The clear-cut treatment will be to stop amphetamines and other drug abuse. I do feel that using small dose of a beta blocker would be appropriate. He has persistent sinus tachycardia but this could be related to his withdrawal. With asthma I would not use a higher dose of beta blocker. Small dose of selective beta blocker is very reasonable and I will start a small dose of metoprolol. Two-dimensional echo is to be done. I would recommend no further workup at this time. Once the patient's overall medical status is stable, our team would be happy to follow him back in the office to reevaluate his PVCs if he is bothered again by palpitations.    Chronic back pain   Atypical pneumonia, possible    Orthostatic hypotension    This is probably related to drugs and poor by mouth intake recently.       Abnormal chest CT     This will be followed by the primary team is his pulmonary status is treated.  Jerral BonitoJeff Rozella Servello, MD 08/16/2014, 12:33 PM

## 2014-08-16 NOTE — ED Notes (Signed)
MD informed of pt's request for pain medication.  

## 2014-08-16 NOTE — Progress Notes (Signed)
ANTIBIOTIC CONSULT NOTE - INITIAL  Pharmacy Consult:  Vancomycin Indication:  Rule out right-sided endocarditis  Allergies  Allergen Reactions  . Penicillins Anaphylaxis  . Banana     Itchy throat   . Carrot [Daucus Carota]     Oral allergy syndrome   . Celery Oil     Oral allergy syndrome   . Other     Walnuts, pecans "oral allergy syndrome"    Patient Measurements: Height: 5\' 6"  (167.6 cm) Weight: 145 lb (65.772 kg) IBW/kg (Calculated) : 63.8  Vital Signs: Temp: 99.5 F (37.5 C) (10/07 1418) Temp Source: Oral (10/07 1418) BP: 134/80 mmHg (10/07 1418) Pulse Rate: 95 (10/07 1418) Intake/Output from previous day: 10/06 0701 - 10/07 0700 In: 2000 [I.V.:2000] Out: -  Intake/Output from this shift: Total I/O In: -  Out: 275 [Urine:275]  Labs:  Recent Labs  08/15/14 2200  WBC 10.0  HGB 14.4  PLT 168  CREATININE 0.92   Estimated Creatinine Clearance: 100.2 ml/min (by C-G formula based on Cr of 0.92). No results found for this basename: VANCOTROUGH, VANCOPEAK, VANCORANDOM, GENTTROUGH, GENTPEAK, GENTRANDOM, TOBRATROUGH, TOBRAPEAK, TOBRARND, AMIKACINPEAK, AMIKACINTROU, AMIKACIN,  in the last 72 hours   Microbiology: No results found for this or any previous visit (from the past 720 hour(s)).  Medical History: Past Medical History  Diagnosis Date  . Asthma   . DDD (degenerative disc disease), cervical   . Spondylosis      Assessment: [age] YOM with history of IV drug use admitted with complaints of SOB, cough and chest pain.  Pharmacy consulted to start vancomycin for rule out endocarditis.  Baseline labs reviewed.   Goal of Therapy:  Vancomycin trough level 15-20 mcg/ml   Plan:  - Vanc 1gm IV Q8H - Continue azithromycin per MD - Monitor renal fxn, clinical progress, vanc trough at [facility]    Taneesha Edgin D. Laney Potashang, PharmD, BCPS Pager:  [phone] 08/16/2014, 4:29 PM

## 2014-08-17 DIAGNOSIS — R7881 Bacteremia: Secondary | ICD-10-CM

## 2014-08-17 DIAGNOSIS — B9562 Methicillin resistant Staphylococcus aureus infection as the cause of diseases classified elsewhere: Secondary | ICD-10-CM

## 2014-08-17 LAB — HIV-1 RNA ULTRAQUANT REFLEX TO GENTYP+
HIV 1 RNA Quant: <20 copies/mL (ref <20)
HIV-1 RNA Quant, Log: <1.3 {Log} (ref <1.30)

## 2014-08-17 LAB — HEPATITIS C VRS RNA DETECT BY PCR-QUAL: Hepatitis C Vrs RNA by PCR-Qual: POSITIVE — AB

## 2014-08-17 LAB — HEPATITIS C ANTIBODY (REFLEX): HCV Ab: REACTIVE — AB

## 2014-08-17 MED ORDER — HYDROMORPHONE HCL 1 MG/ML IJ SOLN
0.5000 mg | Freq: Once | INTRAMUSCULAR | Status: AC
  Administered 2014-08-17: 0.5 mg via INTRAVENOUS
  Filled 2014-08-17: qty 1

## 2014-08-17 MED ORDER — KETOROLAC TROMETHAMINE 15 MG/ML IJ SOLN: 30.0000 mg | Freq: Three times a day (TID) | INTRAMUSCULAR | Status: DC | PRN

## 2014-08-17 NOTE — Discharge Summary (Signed)
Name: Kyle [name] MRN: [id number] DOB: [birth year] 38 y.o. PCP: No Pcp Per Patient  Date of Admission: 08/16/2014 12:55 AM Date of Discharge: 08/17/2014 Attending Physician: No att. providers found  Discharge Diagnosis: Principal Problem:   Staphylococcus aureus bacteremia Active Problems:   Chest pain   Dyspnea   Heroin withdrawal   Amphetamine abuse   Symptomatic premature ventricular contractions   Chronic back pain   Atypical pneumonia, possible   Orthostatic hypotension   Abnormal chest CT  Discharge Medications:   Medication List    ASK your doctor about these medications       albuterol 108 (90 BASE) MCG/ACT inhaler  Commonly known as:  PROVENTIL HFA;VENTOLIN HFA  Inhale 2 puffs into the lungs every 6 (six) hours as needed for wheezing or shortness of breath.        Disposition and follow-up:   [name] left the hospital AGAINST MEDICAL ADVICE. Patient was thoroughly counseled on the extreme risk of morbidity and mortality given his diagnosis of staph aureus bacteremia with suspected endocarditis. Patient understood these risks and stated that he wanted to leave the hospital to seek medical care elsewhere.  Consultations: Treatment Team:  Rounding Lbcardiology, MD  Procedures Performed:  Dg Chest 2 View  08/15/2014   CLINICAL DATA:  Chest pain and shortness of breath. Nonproductive cough.  EXAM: CHEST  2 VIEW  COMPARISON:  08/14/2014  FINDINGS: The heart size and mediastinal contours are within normal limits. Both lungs are clear. The visualized skeletal structures are unremarkable.  IMPRESSION: No active cardiopulmonary disease.   Electronically Signed   By: Geanie Cooley M.D.   On: 08/15/2014 22:23   Dg Chest 2 View  08/14/2014   CLINICAL DATA:  Initial evaluation for cough, chest tightness, fever. Smoker.  EXAM: CHEST  2 VIEW  COMPARISON:  Prior radiograph from 06/13/2008  FINDINGS: The cardiac and mediastinal silhouettes are stable in size and  contour, and remain within normal limits.  The lungs are normally inflated. No airspace consolidation, pleural effusion, or pulmonary edema is identified. There is no pneumothorax.  No acute osseous abnormality identified.  IMPRESSION: No active cardiopulmonary disease.   Electronically Signed   By: Rise Mu M.D.   On: 08/14/2014 06:38   Ct Angio Chest W/cm &/or Wo Cm  08/16/2014   CLINICAL DATA:  Initial evaluation for severe chest pain, cough. Evaluate for pulmonary embolism.  EXAM: CT ANGIOGRAPHY CHEST WITH CONTRAST  TECHNIQUE: Multidetector CT imaging of the chest was performed using the standard protocol during bolus administration of intravenous contrast. Multiplanar CT image reconstructions and MIPs were obtained to evaluate the vascular anatomy.  CONTRAST:  OMNIPAQUE IOHEXOL 350 MG/ML SOLN  COMPARISON:  Prior radiograph performed earlier on the same day.  FINDINGS: Thyroid gland within normal limits. No pathologically enlarged mediastinal, hilar, or axillary lymph nodes identified.  Intrathoracic aorta of normal caliber and appearance. Great vessels within normal limits. Heart size normal. No pericardial effusion.  Pulmonary arterial tree well opacified. No filling defect to suggest acute pulmonary embolism identified. Re-formatted imaging confirms these findings.  Ill defined ground-glass nodular opacity measuring approximately 1.1 cm present within the posterior right lung apex (series 7, image 18). There is a tiny 3 mm nodule more inferiorly within the right upper lobe (series 7, image 26). Additional 4 mm right upper lobe nodule (series 7, image 22). Additional ill-defined focal ground-glass opacity present within the peripheral left upper lobe (series 7, image 33). Scattered ill-defined nodules measuring up to  8 mm present within the left upper lobe as well. Prominent 8 mm nodular opacity present within the lingula. There is a 9 mm nodular opacity along the left hemidiaphragm. 9 mm  nodule present within the left lower lobe (series 7, image 46). These findings are indeterminate, but may represent sequelae of underlying atypical or infection. No consolidative airspace opacity. No pulmonary edema or pleural effusion. Subsegmental atelectasis seen dependently within the lung bases.  Visualized portions of the upper abdomen are within normal limits.  No acute osseous abnormality. No worrisome lytic or blastic osseous lesions.  IMPRESSION: 1. No CT evidence of acute pulmonary embolism. 2. Multi focal ill-defined ground-glass nodular opacities within the bilateral lungs as detailed above. Findings are indeterminate, but may reflect sequelae of underlying atypical infection. Clinical followup to resolution recommended.   Electronically Signed   By: Rise Mu M.D.   On: 08/16/2014 06:01   Admission HPI:   Kyle [name] is a 38 year old man with PMH of polysubstance abuse (with IV heroin use and amphetamine use), chronic back pain s/p lumbar laminectomy in 2009, hx of symptomatic PVCs (previously seen by Dr. Graciela Husbands and Dr. Antoine Poche but has not followed up), and asthma, who presented to the [facility] for cough productive of green/yellow sputum, chills, and increased shortness of breath. He reports last using IV heroin 3 days ago. He reports that he had amphetamines 3 days ago and that was his first time using it. He denies recent alcohol use. He denies tobacco use. He had an asthma attack earlier this week and was seen here at the ED. He was given nebulizer treatment and albuterol inhaler at that time however, his cough worsened with "spells" of cough and shortness of breath. He also starting feeling chest tightness and soreness in his bilateral ribs and his right chest that radiated to his right arm and jaw. He does not use an inhaler every day and has never been hospitalized for an asthma flare up.   In the ED he was found to be tachycardic with HR of 108 and PE was considered. His D-dimer was  elevated to 0.92, a CT Chest angiogram was negative for PE but revealed multi focal ill-defined ground-glass nodular opacities within the bilateral lungs concerning for sequale of atypical infection. He is not given nebulizer treatment but continued to maintain oxygen saturation of 95-100% on RA though he was tachypnic at times with severe cough. TSH was within normal range of 0.684. UDS was positive for opiates and amphetamines. His poc troponin was negative and his EKG had sinus tachycardia with multiple PVCs but no ST changes. He received Azithromycin 500mg  IV for atypical pneumonia. The IMTS team was called for his hospitalization for further evaluation and treatment of his symptoms.    Hospital Course by problem list: Principal Problem:   Staphylococcus aureus bacteremia Active Problems:   Chest pain   Dyspnea   Heroin withdrawal   Amphetamine abuse   Symptomatic premature ventricular contractions   Chronic back pain   Atypical pneumonia, possible   Orthostatic hypotension   Abnormal chest CT   Staph aureus bacteremia: Patient with extensive history of IV drug use presenting with chest pain and dyspnea. Chest CT concerning for septic emboli. Blood cultures were positive for staph aureus. Patient was initially started on azithromycin for possible atypical pneumonia and vancomycin for staph aureus bacteremia. Endocarditis was suspected given the bacteremia and CT chest findings, though an echocardiogram was still pending at the time that the patient left  AMA. Patient was thoroughly counseled on the extreme risk of leaving the hospital without having further treatment. However, patient stated that he would seek medical treatment elsewhere since he was displeased with his care in the regularity at which he got his pain medications (history of heroin use).  Heroin withdrawal/amphetamine use: Patient said that he last used heroin over one week ago. Patient was anxious and his sense of early opiate  withdrawal on exam. Patient was managed with Dilaudid 2 mg every 2 hours. Patient was also put on Ativan 1 mg IV every 6 hours when necessary.  Symptomatic premature ventricular contractions: He has documented history of frequent ventricular ectopy without sustained arrhythmias dating back to 2008 (per Epic). He underwent invasive electrophysiological study with arrhythmia mapping and pace mapping on 10/25/07 by Dr. Graciela HusbandsKlein which showed that the ectopy was emerging likely from the left ventricular outflow tract and coronary cusps and he was felt not to be a good candidate for ablation of that focus. He had Myoview in July 2008 with EF of 45-50% with mild diffuse hypokinesis. He used to be seen by Dr. Graciela HusbandsKlein and Dr. Antoine PocheHochrein in Cardiology, was on multiple medications, including metoprolol, diltiazem, and propafenone for this problem but has not followed with Cardiology and is no longer on any of these medications. He states that his symptoms had improved until his recent use of amphetamines which may have worsened his PVCs. Patient may need to reestablish care with cardiology at some point.  Chronic back pain: patient status post lumbar laminectomy in 2009 for lateral disc herniation. Postoperative course was consulted by wound infection requiring I&D and is followed by Dr. Franky Machoabbell neurosurgery. He has persistent low back pain that he treats with IV heroin. Patient was given morphine 1 mg IV every 4 when necessary.   Discharge Vitals:   BP 138/75  Pulse 111  Temp(Src) 98.9 F (37.2 C) (Oral)  Resp 22  Ht 5\' 6"  (1.676 m)  Wt 145 lb (65.772 kg)  BMI 23.41 kg/m2  SpO2 97%  Discharge Labs:  Results for orders placed during the hospital encounter of 08/16/14 (from the past 24 hour(s))  LACTATE DEHYDROGENASE     Status: None   Collection Time    08/16/14  7:22 PM      Result Value Ref Range   LDH 207  94 - 250 U/L    Signed: Harold BarbanLawrence Tereasa Yilmaz, MD 08/17/2014, 4:03 PM

## 2014-08-17 NOTE — Progress Notes (Signed)
Internal Medicine Night Float Interim Progress Note  S: Called by nursing at 0200 because patient threatening to leave AMA.  Went to see patient with Dr. Delane GingerGill, and patient said he has too much going on at home to stay in hospital.  Also states that he has been treated poorly by nursing staff.  Requesting Dilaudid q1h and discontinuation of bed alarm otherwise he will leave.  I apologized that he he is feeling this way and emphasized the importance of him staying in the hospital to complete his work-up and receive treatment for his blood infection.  He expressed understanding, but said he would rather die from his infection than stay in the hospital.  He says he will go home and go to a different hospital tomorrow to receive treatment for his endocarditis.  He agreed to stay if he received an extra dose of Dilaudid, which was given at 0241.  Asked nursing to give a dose of Ativan as well.  Called again by nursing at 0350 stating that patient had removed his IV and telemetry and signed out AMA.  O: Filed Vitals:   08/16/14 2018  BP: 138/75  Pulse: 111  Temp: 98.9 F (37.2 C)  Resp: 22   Physical Exam  Constitutional: He is well-developed, well-nourished, and in no distress. No distress.  Complaining of severe pain in back and R leg. Anxious appearing.  Skin: He is diaphoretic.    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  He is aware of the severity of his illness, the need for IV treatment, and the risks of leaving the hospital, including worsening of his condition and possible death.  However, he has decided to leave AMA and seek medical care elsewhere. -Patient signed AMA paperwork. -Left AMA.  Luisa DagoEverett Imran Nuon, MD, PhD Internal Medicine Intern Pager: [phone] 08/17/2014,3:50 AM

## 2014-08-17 NOTE — Progress Notes (Signed)
After the safety sitter was reassigned and had to leave Kyle [name]'s bedside, the bed alarm was turned on for the his safety.  Kyle [name] became agitated when the bed alarm sounded as he was trying to get up to go to the bathroom.  He stated: "I told the doctor that I didn't want the bed alarm on or I was leaving AMA and he promised me that the bed alarm wouldn't be turned on."  This RN explained to the patient that he was receiving medications that put him at risk for falls and that he had also had periods of confusion during this hospital stay so the bed alarm would need to be in use so that someone could be present when he ambulated to help keep him safe from falls.  The patient stated: "I'm leaving.  Bring me the paper so I can sign out AMA."  This RN informed the patient that he was putting his life at great risk by discontinuing treatment and leaving the hospital without further care.  Patient stated: "I'm going to another hospital tomorrow.  I'll be fine.  [facility]  [facility]  This RN removed both of the patient's peripheral IVs and notified MD.  The patient called a taxi from the phone at the nurses station and left the floor.  Patient was in no apparent distress at the time of his departure from 2 west.    Brooksie Ellwanger,RN

## 2014-08-18 ENCOUNTER — Telehealth: Payer: Self-pay | Admitting: Internal Medicine

## 2014-08-18 LAB — CULTURE, BLOOD (ROUTINE X 2)

## 2014-08-18 NOTE — Telephone Encounter (Signed)
Patient left AGAINST MEDICAL ADVICE from the hospital with positive staph aureus bacteremia with suspected endocarditis. Patient stated that he would seek medical treatment elsewhere. No answer on the phone. A message was left again urging the patient to come to our hospital or some hospital to seek treatment for his infection.

## 2014-09-08 ENCOUNTER — Inpatient Hospital Stay (HOSPITAL_COMMUNITY)
Admission: EM | Admit: 2014-09-08 | Discharge: 2014-09-10 | DRG: 867 | Disposition: A | Discharge status: Home / Self Care | Payer: Self-pay | Attending: Internal Medicine | Admitting: Internal Medicine

## 2014-09-08 ENCOUNTER — Encounter (HOSPITAL_COMMUNITY): Payer: Self-pay | Admitting: Emergency Medicine

## 2014-09-08 ENCOUNTER — Emergency Department (HOSPITAL_COMMUNITY): Payer: Self-pay

## 2014-09-08 DIAGNOSIS — Z79899 Other long term (current) drug therapy: Secondary | ICD-10-CM

## 2014-09-08 DIAGNOSIS — M5136 Other intervertebral disc degeneration, lumbar region: Secondary | ICD-10-CM | POA: Diagnosis present

## 2014-09-08 DIAGNOSIS — F1123 Opioid dependence with withdrawal: Secondary | ICD-10-CM

## 2014-09-08 DIAGNOSIS — F1510 Other stimulant abuse, uncomplicated: Secondary | ICD-10-CM | POA: Diagnosis present

## 2014-09-08 DIAGNOSIS — M545 Low back pain: Secondary | ICD-10-CM | POA: Diagnosis present

## 2014-09-08 DIAGNOSIS — Z880 Allergy status to penicillin: Secondary | ICD-10-CM

## 2014-09-08 DIAGNOSIS — G8929 Other chronic pain: Secondary | ICD-10-CM

## 2014-09-08 DIAGNOSIS — J9851 Mediastinitis: Secondary | ICD-10-CM | POA: Diagnosis present

## 2014-09-08 DIAGNOSIS — J985 Diseases of mediastinum, not elsewhere classified: Secondary | ICD-10-CM | POA: Diagnosis present

## 2014-09-08 DIAGNOSIS — J45909 Unspecified asthma, uncomplicated: Secondary | ICD-10-CM | POA: Diagnosis present

## 2014-09-08 DIAGNOSIS — M4626 Osteomyelitis of vertebra, lumbar region: Secondary | ICD-10-CM | POA: Diagnosis present

## 2014-09-08 DIAGNOSIS — F1110 Opioid abuse, uncomplicated: Secondary | ICD-10-CM | POA: Diagnosis present

## 2014-09-08 DIAGNOSIS — T50995A Adverse effect of other drugs, medicaments and biological substances, initial encounter: Secondary | ICD-10-CM | POA: Diagnosis present

## 2014-09-08 DIAGNOSIS — I493 Ventricular premature depolarization: Secondary | ICD-10-CM

## 2014-09-08 DIAGNOSIS — R9389 Abnormal findings on diagnostic imaging of other specified body structures: Secondary | ICD-10-CM | POA: Diagnosis present

## 2014-09-08 DIAGNOSIS — B1920 Unspecified viral hepatitis C without hepatic coma: Secondary | ICD-10-CM | POA: Diagnosis present

## 2014-09-08 DIAGNOSIS — R079 Chest pain, unspecified: Secondary | ICD-10-CM

## 2014-09-08 DIAGNOSIS — Z91018 Allergy to other foods: Secondary | ICD-10-CM

## 2014-09-08 DIAGNOSIS — B9561 Methicillin susceptible Staphylococcus aureus infection as the cause of diseases classified elsewhere: Secondary | ICD-10-CM

## 2014-09-08 DIAGNOSIS — M549 Dorsalgia, unspecified: Secondary | ICD-10-CM

## 2014-09-08 DIAGNOSIS — Z86711 Personal history of pulmonary embolism: Secondary | ICD-10-CM

## 2014-09-08 DIAGNOSIS — F17210 Nicotine dependence, cigarettes, uncomplicated: Secondary | ICD-10-CM | POA: Diagnosis present

## 2014-09-08 DIAGNOSIS — I2690 Septic pulmonary embolism without acute cor pulmonale: Secondary | ICD-10-CM | POA: Diagnosis present

## 2014-09-08 DIAGNOSIS — M479 Spondylosis, unspecified: Secondary | ICD-10-CM | POA: Diagnosis present

## 2014-09-08 DIAGNOSIS — A4902 Methicillin resistant Staphylococcus aureus infection, unspecified site: Principal | ICD-10-CM | POA: Diagnosis present

## 2014-09-08 DIAGNOSIS — F1193 Opioid use, unspecified with withdrawal: Secondary | ICD-10-CM

## 2014-09-08 DIAGNOSIS — B9562 Methicillin resistant Staphylococcus aureus infection as the cause of diseases classified elsewhere: Secondary | ICD-10-CM | POA: Diagnosis present

## 2014-09-08 DIAGNOSIS — F1410 Cocaine abuse, uncomplicated: Secondary | ICD-10-CM | POA: Diagnosis present

## 2014-09-08 DIAGNOSIS — Z792 Long term (current) use of antibiotics: Secondary | ICD-10-CM

## 2014-09-08 DIAGNOSIS — R7881 Bacteremia: Secondary | ICD-10-CM | POA: Diagnosis present

## 2014-09-08 LAB — COMPREHENSIVE METABOLIC PANEL
ALBUMIN: 3.3 g/dL — AB (ref 3.5–5.2)
ALT: 32 U/L (ref 0–53)
AST: 28 U/L (ref 0–37)
Alkaline Phosphatase: 98 U/L (ref 39–117)
Anion gap: 13 (ref 5–15)
BUN: 9 mg/dL (ref 6–23)
CO2: 28 mEq/L (ref 19–32)
Calcium: 9.1 mg/dL (ref 8.4–10.5)
Chloride: 95 mEq/L — ABNORMAL LOW (ref 96–112)
Creatinine, Ser: 1.08 mg/dL (ref 0.50–1.35)
GFR calc Af Amer: >90 mL/min (ref >90)
GFR calc non Af Amer: 87 mL/min — ABNORMAL LOW (ref >90)
Glucose, Bld: 117 mg/dL — ABNORMAL HIGH (ref 70–99)
Potassium: 4.5 mEq/L (ref 3.7–5.3)
SODIUM: 136 meq/L — AB (ref 137–147)
TOTAL PROTEIN: 8 g/dL (ref 6.0–8.3)
Total Bilirubin: 0.4 mg/dL (ref 0.3–1.2)

## 2014-09-08 LAB — URINALYSIS, ROUTINE W REFLEX MICROSCOPIC
Bilirubin Urine: NEGATIVE
GLUCOSE, UA: NEGATIVE mg/dL
HGB URINE DIPSTICK: NEGATIVE
Ketones, ur: NEGATIVE mg/dL
LEUKOCYTES UA: NEGATIVE
Nitrite: NEGATIVE
Protein, ur: NEGATIVE mg/dL
SPECIFIC GRAVITY, URINE: 1.019 (ref 1.005–1.030)
Urobilinogen, UA: 0.2 mg/dL (ref 0.0–1.0)
pH: 6.5 (ref 5.0–8.0)

## 2014-09-08 LAB — CBC WITH DIFFERENTIAL/PLATELET
BASOS PCT: 0 % (ref 0–1)
Basophils Absolute: 0 10*3/uL (ref 0.0–0.1)
EOS ABS: 0 10*3/uL (ref 0.0–0.7)
Eosinophils Relative: 0 % (ref 0–5)
HCT: 39.9 % (ref 39.0–52.0)
Hemoglobin: 13.2 g/dL (ref 13.0–17.0)
LYMPHS ABS: 1.6 10*3/uL (ref 0.7–4.0)
Lymphocytes Relative: 20 % (ref 12–46)
MCH: 28.7 pg (ref 26.0–34.0)
MCHC: 33.1 g/dL (ref 30.0–36.0)
MCV: 86.7 fL (ref 78.0–100.0)
Monocytes Absolute: 0.8 10*3/uL (ref 0.1–1.0)
Monocytes Relative: 9 % (ref 3–12)
NEUTROS PCT: 71 % (ref 43–77)
Neutro Abs: 5.7 10*3/uL (ref 1.7–7.7)
Platelets: 144 10*3/uL — ABNORMAL LOW (ref 150–400)
RBC: 4.6 MIL/uL (ref 4.22–5.81)
RDW: 13.3 % (ref 11.5–15.5)
WBC: 8.1 10*3/uL (ref 4.0–10.5)

## 2014-09-08 LAB — I-STAT TROPONIN, ED: Troponin i, poc: 0.04 ng/mL (ref 0.00–0.08)

## 2014-09-08 LAB — I-STAT CG4 LACTIC ACID, ED
LACTIC ACID, VENOUS: 2.03 mmol/L (ref 0.5–2.2)
Lactic Acid, Venous: 1.93 mmol/L (ref 0.5–2.2)

## 2014-09-08 LAB — TROPONIN I: Troponin I: <0.3 ng/mL (ref <0.30)

## 2014-09-08 MED ORDER — VANCOMYCIN HCL IN DEXTROSE 1-5 GM/200ML-% IV SOLN
1000.0000 mg | Freq: Once | INTRAVENOUS | Status: AC
  Administered 2014-09-08: 1000 mg via INTRAVENOUS
  Filled 2014-09-08: qty 200

## 2014-09-08 MED ORDER — HYDROMORPHONE HCL 2 MG/ML IJ SOLN
2.0000 mg | Freq: Once | INTRAMUSCULAR | Status: AC
  Administered 2014-09-08: 2 mg via INTRAVENOUS
  Filled 2014-09-08: qty 1

## 2014-09-08 MED ORDER — HYDROMORPHONE HCL 2 MG/ML IJ SOLN
2.0000 mg | INTRAMUSCULAR | Status: AC | PRN
  Administered 2014-09-08 (×3): 2 mg via INTRAVENOUS
  Filled 2014-09-08 (×3): qty 1

## 2014-09-08 MED ORDER — SODIUM CHLORIDE 0.9 % IV BOLUS (SEPSIS)
1000.0000 mL | INTRAVENOUS | Status: AC
  Administered 2014-09-08 (×4): 1000 mL via INTRAVENOUS

## 2014-09-08 MED ORDER — SODIUM CHLORIDE 0.9 % IV BOLUS (SEPSIS)
500.0000 mL | INTRAVENOUS | Status: AC
  Administered 2014-09-08: 500 mL via INTRAVENOUS

## 2014-09-08 NOTE — ED Notes (Signed)
PA at bedside.

## 2014-09-08 NOTE — ED Provider Notes (Signed)
Medical screening examination/treatment/procedure(s) were performed by non-physician practitioner and as supervising physician I was immediately available for consultation/collaboration.   EKG Interpretation   Date/Time:  Friday September 08 2014 15:21:54 EDT Ventricular Rate:  121 PR Interval:  114 QRS Duration: 93 QT Interval:  312 QTC Calculation: 443 R Axis:   -24 Text Interpretation:  Sinus tachycardia Multiple ventricular premature  complexes Aberrant complex Left atrial enlargement Borderline left axis  deviation Probable anteroseptal infarct, old Confirmed by DOCHERTY  MD,  MEGAN [phone]([id number]) on 09/08/2014 3:41:55 PM        Toy CookeyMegan Docherty, MD 09/08/14 2150

## 2014-09-08 NOTE — ED Provider Notes (Signed)
CSN: [id number]     Arrival date & time 09/08/14  1508 History   First MD Initiated Contact with Patient 09/08/14 1525     Chief Complaint  Patient presents with  . Fever  . Chest Pain     (Consider location/radiation/quality/duration/timing/severity/associated sxs/prior Treatment) The history is provided by the patient and medical records. No language interpreter was used.    Kyle [name] is a 38 y.o. male  with a hx of PMH of polysubstance abuse (with IV heroin use and amphetamine use), chronic back pain s/p lumbar laminectomy in 2009, hx of symptomatic PVCs (previously seen by Dr. Graciela HusbandsKlein and Dr. Antoine PocheHochrein but has not followed up), and asthma presents to the Emergency Department complaining of gradual, persistent, progressively worsening fever, with associated chills and general fatigue returning 1 week ago and then worsening 3 days ago.  Pt reports the fevers are worse at night.  Associated symptoms include swollen lymph nodes, arthralgias, myalgias, chest pain (similar to previous septicemia), mild SOB, headaches (frontal and throbbing), back pain (chronic).  Nothing makes it better and nothing makes it worse.  Pt denies neck pain, neck stiffness, abd pain, N/V/D, dysuria.  Pt reports last use of heroin was several weeks ago.  He was [facility] around Oct 17th.  At that time he was supposed to receive a PICC line, but refused this. Pt is taking cipro and rifampin that he was d/c home with.    [facility] record review:  Patient was [facility] 08/[facility]/2014 with chest pain, cough and fever. He was admitted for 24 hours and found to have methicillin-resistant Staphylococcus aureus bacteremia with questionable endocarditis and given IV vancomycin.  CTA of the chest was performed which showed multifocal ground glass opacities representative of likely septic emboli.  Patient left AMA prior to echocardiogram because he stated he was not receiving enough pain  control.  [facility] on 08/17/2014 where an echocardiogram and transesophageal echocardiogram was performed which showed no evidence of vegetations or abscess. Due to persistent pain on the anterior chest wall, the patient had a CT of the chest performed, which was concerning for mediastinitis and cellulitis with multifocal lung nodulations and consolidations concerning for septic emboli. Cardiothoracic Surgery was subsequently consulted and recommended continued antibiotic therapy as well as a repeat CT of the chest, which showed improvement of mediastinitis without surgical intervention. During this hospitalization, the patient was continued on vancomycin therapy dosed based on trough levels and obtain daily blood cultures until achieving at least 48 hours of clearance, at which point a PICC line was placed. Due to persistent concerns over continued back pain, the patient was evaluated with an MRI of the thoracic and lumbar spine, which was indicative of lumbar osteomyelitis. Lastly, due to erythema, warmth and induration in a localized region of the right forearm and concern for septic thrombophlebitis, an ultrasound of the right upper extremity was performed, which was indeed indicative of thrombophlebitis prompting Vascular Surgery consultation for further evaluation.  On 08/25/2014 Attending at [facility] went to examine patient and when she leaned over to awaken him noted three syringes and multiple medicine cups with crushed pills in a bag beside his bed. Patient notified by attending of these findings and also notified that it is not our policy to discharge patients home with PICC lines if they have recently used IV drugs. PT to evaluate patient for any rehab needs given right  lower extremity weakness. Patient then adamant that he would need to leave and could not remain inpatient or in a nursing facility for the duration required for  treatment of his above illness. PICC line removed prior to discharge AMA. Patient also provided with follow up appointment with [facility] on 09/07/2014 at 2:30 PM.   Record review shows the patient did not follow-up with ID yesterday on 09/07/2014.   Past Medical History  Diagnosis Date  . Asthma   . DDD (degenerative disc disease), cervical   . Spondylosis    Past Surgical History  Procedure Laterality Date  . Spine surgery    . Tonsillectomy     History reviewed. No pertinent family history. History  Substance Use Topics  . Smoking status: Current Every Day Smoker -- 0.50 packs/day    Types: Cigarettes  . Smokeless tobacco: Never Used  . Alcohol Use: No    Review of Systems  Constitutional: Positive for fever, chills and fatigue. Negative for diaphoresis, appetite change and unexpected weight change.  HENT: Negative for mouth sores.   Eyes: Negative for visual disturbance.  Respiratory: Positive for shortness of breath. Negative for cough, chest tightness and wheezing.   Cardiovascular: Positive for chest pain.  Gastrointestinal: Negative for nausea, vomiting, abdominal pain, diarrhea and constipation.  Endocrine: Negative for polydipsia, polyphagia and polyuria.  Genitourinary: Negative for dysuria, urgency, frequency and hematuria.  Musculoskeletal: Positive for arthralgias, back pain (chronic) and myalgias. Negative for neck stiffness.  Skin: Negative for rash.  Allergic/Immunologic: Negative for immunocompromised state.  Neurological: Positive for weakness (right leg) and headaches. Negative for syncope and light-headedness.  Hematological: Positive for adenopathy. Does not bruise/bleed easily.  Psychiatric/Behavioral: Negative for sleep disturbance. The patient is not nervous/anxious.       Allergies  Penicillins; Banana; Carrot; Celery oil; and Other  Home Medications   Prior to Admission medications   Medication Sig Start Date End Date Taking? Authorizing  Provider  albuterol (PROVENTIL HFA;VENTOLIN HFA) 108 (90 BASE) MCG/ACT inhaler Inhale 2 puffs into the lungs every 6 (six) hours as needed for wheezing or shortness of breath (wheezing & shortness of breath).    Yes Historical Provider, MD  ciprofloxacin (CIPRO) 750 MG tablet Take 750 mg by mouth 2 (two) times daily.   Yes Historical Provider, MD  gabapentin (NEURONTIN) 300 MG capsule Take 300 mg by mouth 3 (three) times daily.   Yes Historical Provider, MD   BP 127/64  Pulse 79  Temp(Src) 100.5 F (38.1 C) (Rectal)  Resp 23  Wt 150 lb (68.04 kg)  SpO2 99% Physical Exam  Nursing note and vitals reviewed. Constitutional: He appears well-developed and well-nourished. No distress.  Awake, alert, nontoxic appearance  HENT:  Head: Normocephalic and atraumatic.  Mouth/Throat: Oropharynx is clear and moist. No oropharyngeal exudate.  Eyes: Conjunctivae are normal. No scleral icterus.  Neck: Normal range of motion. Neck supple.  Cardiovascular: Regular rhythm, normal heart sounds and intact distal pulses.   No murmur heard. No murmur tachcyardia  Pulmonary/Chest: Effort normal and breath sounds normal. No respiratory distress. He has no wheezes. He exhibits tenderness (anterior).  Equal chest expansion TTP of the anterior chest  Abdominal: Soft. Bowel sounds are normal. He exhibits no mass. There is no tenderness. There is no rebound and no guarding.  Musculoskeletal: Normal range of motion. He exhibits no edema.  Neurological: He is alert. He exhibits normal muscle tone. Coordination normal.  Speech is clear and goal oriented Moves extremities without ataxia  Skin:  Skin is warm and dry. No rash noted. He is not diaphoretic. No erythema.  No splinter hemorrhages No rash - no petechiae or purpura  Psychiatric: He has a normal mood and affect.    ED Course  Procedures (including critical care time) Labs Review Labs Reviewed  CBC WITH DIFFERENTIAL - Abnormal; Notable for the  following:    Platelets 144 (*)    All other components within normal limits  COMPREHENSIVE METABOLIC PANEL - Abnormal; Notable for the following:    Sodium 136 (*)    Chloride 95 (*)    Glucose, Bld 117 (*)    Albumin 3.3 (*)    GFR calc non Af Amer 87 (*)    All other components within normal limits  CULTURE, BLOOD (ROUTINE X 2)  CULTURE, BLOOD (ROUTINE X 2)  URINE CULTURE  URINALYSIS, ROUTINE W REFLEX MICROSCOPIC  TROPONIN I  I-STAT CG4 LACTIC ACID, ED  I-STAT TROPOININ, ED  I-STAT CG4 LACTIC ACID, ED    Imaging Review Dg Chest Port 1 View  09/08/2014   CLINICAL DATA:  Endocarditis.  EXAM: PORTABLE CHEST - 1 VIEW  COMPARISON:  08/15/2014  FINDINGS: The heart size and mediastinal contours are within normal limits. Both lungs are clear. The visualized skeletal structures are unremarkable.  IMPRESSION: No active disease.   Electronically Signed   By: Maryclare BeanArt  Hoss M.D.   On: 09/08/2014 16:37     EKG Interpretation   Date/Time:  Friday September 08 2014 15:21:54 EDT Ventricular Rate:  121 PR Interval:  114 QRS Duration: 93 QT Interval:  312 QTC Calculation: 443 R Axis:   -24 Text Interpretation:  Sinus tachycardia Multiple ventricular premature  complexes Aberrant complex Left atrial enlargement Borderline left axis  deviation Probable anteroseptal infarct, old Confirmed by DOCHERTY  MD,  MEGAN ([id number]) on 09/08/2014 3:41:55 PM      Echo 08/21/14 SUMMARY The left ventricular size is normal.  Left ventricular systolic function is low normal. LV ejection fraction = 50-55%. The right ventricle is normal in size and function. The left atrial appendage is normal. The interatrial septum is intact with no evidence for an atrial septal defect. There is no significant valvular stenosis or regurgitation No vegetations were seen on any of the valves. There is no comparison study available.  MRI thoracic spine - 08/22/14 CONCLUSION:  1. No evidence of thoracic  discitis/osteomyelitis. 2. No foraminal or spinal canal stenosis in the thoracic spine. 3. Multiple nodular lesions in the lungs bilaterally are better characterized on the prior chest CT.   MRI lumbar spine - 08/22/14 CONCLUSION:  Signal normality with corresponding enhancement in the endplates adjacent to the L4-L5 disc space, consistent with osteomyelitis. There is near obliteration of the disc space, suggestive of a long-standing process.  MDM   Final diagnoses:  Staphylococcus aureus bacteremia  Chest pain, unspecified chest pain type  Heroin withdrawal  Amphetamine abuse  Symptomatic premature ventricular contractions  Chronic back pain   Kyle [name] presents with c/o fevers, weakness, chills after recent diagnosis of bacteremia and endocarditis without compliance of therapy.  Pt with Hx of IVDU.  Will begin sepsis work-up.    5:16 PM Pt reports that he will not consent to his UDS.  I suspect that he is lying about his recent drug usage.    7:30PM Labs reassuring. Patient is not septic. IV vancomycin given. Will admit to [facility].  8:05PM Patient was admitted within the last 30 days by internal medicine teaching service and is  therefore a bounce back to their service. Will contact them.  8:39 PM Pt discussed with Dr. Sherrine Maples, IM teaching service.  He will be admitted at [facility] to a tele bed.    BP 127/64  Pulse 79  Temp(Src) 100.5 F (38.1 C) (Rectal)  Resp 23  Wt 150 lb (68.04 kg)  SpO2 99%   Dierdre Forth, PA-C 09/08/14 2042

## 2014-09-08 NOTE — ED Notes (Signed)
Per pt was discharged with antibiotics for osteomyelitis and MRSA. Pt reports pain worsened and fever.

## 2014-09-08 NOTE — Progress Notes (Signed)
  CARE MANAGEMENT ED NOTE 09/08/2014  Patient:  Demartin,Medford   Account Number:  [id number]  Date Initiated:  09/08/2014  Documentation initiated by:  Radford PaxFERRERO,Gustavus Haskin  Subjective/Objective Assessment:   Patient presents to Ed with wosening pain nd fever     Subjective/Objective Assessment Detail:   Patient noted to have left A[facility] due to lack of pain control.  Patient had been diagnosed with MRSA , ? endocarditis,   CTA of the chest was performed which showed multifocal ground glass opacities representative of likely septic emboli.  Patient with pmhx of substance abuse.     Action/Plan:   Action/Plan Detail:   Anticipated DC Date:       Status Recommendation to Physician:   Result of Recommendation:    Other ED Services  Consult Working Plan    DC Planning Services  Other  PCP issues    Choice offered to / List presented to:            Status of service:  Completed, signed off  ED Comments:   ED Comments Detail:  EDCM spoke to patient at bedside. Patient confirms he does not have a pcp or insurance [place]. EDCM provide patient with pamphlet to [facility], informed patient of services there and walk in times.  EDCM also provided patient with list of pcps who accept self pay patients, list of discount pharmacies and websites needymeds.org and GoodRX.com for medication assistance, phone number to inquire about the orange card, phone number to inquire about [facility], phone number to inquire about the Affordable Care Act, financial resources in the community such as local churches, [employer], urban ministries, and dental assistance for uninsured patients. Patient thankfulf or resources.  No further EDCM needs at this time.

## 2014-09-08 NOTE — ED Notes (Signed)
Called [facility], [facility] sts it will be two hours before a truck can come get patient.

## 2014-09-08 NOTE — ED Notes (Signed)
Pt sts he refuses drug screen.

## 2014-09-09 DIAGNOSIS — G8929 Other chronic pain: Secondary | ICD-10-CM

## 2014-09-09 DIAGNOSIS — R7881 Bacteremia: Secondary | ICD-10-CM

## 2014-09-09 DIAGNOSIS — R5381 Other malaise: Secondary | ICD-10-CM

## 2014-09-09 DIAGNOSIS — B1920 Unspecified viral hepatitis C without hepatic coma: Secondary | ICD-10-CM

## 2014-09-09 DIAGNOSIS — I2690 Septic pulmonary embolism without acute cor pulmonale: Secondary | ICD-10-CM

## 2014-09-09 DIAGNOSIS — R079 Chest pain, unspecified: Secondary | ICD-10-CM

## 2014-09-09 DIAGNOSIS — J985 Diseases of mediastinum, not elsewhere classified: Secondary | ICD-10-CM

## 2014-09-09 DIAGNOSIS — J9851 Mediastinitis: Secondary | ICD-10-CM | POA: Diagnosis present

## 2014-09-09 DIAGNOSIS — F1510 Other stimulant abuse, uncomplicated: Secondary | ICD-10-CM

## 2014-09-09 DIAGNOSIS — M4626 Osteomyelitis of vertebra, lumbar region: Secondary | ICD-10-CM

## 2014-09-09 DIAGNOSIS — F1110 Opioid abuse, uncomplicated: Secondary | ICD-10-CM

## 2014-09-09 DIAGNOSIS — M549 Dorsalgia, unspecified: Secondary | ICD-10-CM

## 2014-09-09 DIAGNOSIS — R509 Fever, unspecified: Secondary | ICD-10-CM

## 2014-09-09 DIAGNOSIS — B9562 Methicillin resistant Staphylococcus aureus infection as the cause of diseases classified elsewhere: Secondary | ICD-10-CM

## 2014-09-09 LAB — IRON AND TIBC
Iron: 23 ug/dL — ABNORMAL LOW (ref 42–135)
Saturation Ratios: 10 % — ABNORMAL LOW (ref 20–55)
TIBC: 228 ug/dL (ref 215–435)
UIBC: 205 ug/dL (ref 125–400)

## 2014-09-09 LAB — BASIC METABOLIC PANEL
Anion gap: 13 (ref 5–15)
BUN: 7 mg/dL (ref 6–23)
CO2: 22 mEq/L (ref 19–32)
Calcium: 8.2 mg/dL — ABNORMAL LOW (ref 8.4–10.5)
Chloride: 104 mEq/L (ref 96–112)
Creatinine, Ser: 0.9 mg/dL (ref 0.50–1.35)
GLUCOSE: 90 mg/dL (ref 70–99)
POTASSIUM: 4 meq/L (ref 3.7–5.3)
Sodium: 139 mEq/L (ref 137–147)

## 2014-09-09 LAB — RAPID URINE DRUG SCREEN, HOSP PERFORMED
Amphetamines: NOT DETECTED
Barbiturates: NOT DETECTED
Benzodiazepines: NOT DETECTED
Cocaine: POSITIVE — AB
OPIATES: POSITIVE — AB
TETRAHYDROCANNABINOL: NOT DETECTED

## 2014-09-09 LAB — HEPATITIS A ANTIBODY, TOTAL: HEP A TOTAL AB: NONREACTIVE

## 2014-09-09 LAB — CBC
HCT: 37.5 % — ABNORMAL LOW (ref 39.0–52.0)
Hemoglobin: 12.7 g/dL — ABNORMAL LOW (ref 13.0–17.0)
MCH: 28.9 pg (ref 26.0–34.0)
MCHC: 33.9 g/dL (ref 30.0–36.0)
MCV: 85.4 fL (ref 78.0–100.0)
PLATELETS: 133 10*3/uL — AB (ref 150–400)
RBC: 4.39 MIL/uL (ref 4.22–5.81)
RDW: 13.2 % (ref 11.5–15.5)
WBC: 6.5 10*3/uL (ref 4.0–10.5)

## 2014-09-09 LAB — PROTIME-INR
INR: 1.11 (ref 0.00–1.49)
PROTHROMBIN TIME: 14.4 s (ref 11.6–15.2)

## 2014-09-09 LAB — HEPATITIS B CORE ANTIBODY, TOTAL: HEP B C TOTAL AB: NONREACTIVE

## 2014-09-09 LAB — TROPONIN I: Troponin I: <0.3 ng/mL (ref <0.30)

## 2014-09-09 LAB — HEPATITIS B SURFACE ANTIGEN: Hepatitis B Surface Ag: NEGATIVE

## 2014-09-09 LAB — HEPATITIS B SURFACE ANTIBODY,QUALITATIVE: HEP B S AB: POSITIVE — AB

## 2014-09-09 MED ORDER — ALBUTEROL SULFATE (2.5 MG/3ML) 0.083% IN NEBU: 3.0000 mL | INHALATION_SOLUTION | Freq: Four times a day (QID) | RESPIRATORY_TRACT | Status: DC | PRN

## 2014-09-09 MED ORDER — VANCOMYCIN HCL IN DEXTROSE 750-5 MG/150ML-% IV SOLN
750.0000 mg | Freq: Three times a day (TID) | INTRAVENOUS | Status: DC
  Administered 2014-09-09 (×3): 750 mg via INTRAVENOUS
  Filled 2014-09-09 (×5): qty 150

## 2014-09-09 MED ORDER — GABAPENTIN 300 MG PO CAPS
300.0000 mg | ORAL_CAPSULE | Freq: Three times a day (TID) | ORAL | Status: DC
  Administered 2014-09-09 (×3): 300 mg via ORAL
  Filled 2014-09-09 (×6): qty 1

## 2014-09-09 MED ORDER — HEPARIN SODIUM (PORCINE) 5000 UNIT/ML IJ SOLN
5000.0000 [IU] | Freq: Three times a day (TID) | INTRAMUSCULAR | Status: DC
Start: 2014-09-09 — End: 2014-09-10
  Filled 2014-09-09 (×6): qty 1

## 2014-09-09 MED ORDER — SODIUM CHLORIDE 0.9 % IV SOLN
INTRAVENOUS | Status: DC
  Administered 2014-09-09 (×2): via INTRAVENOUS

## 2014-09-09 MED ORDER — SODIUM CHLORIDE 0.9 % IJ SOLN
3.0000 mL | Freq: Two times a day (BID) | INTRAMUSCULAR | Status: DC
  Administered 2014-09-09 (×2): 3 mL via INTRAVENOUS

## 2014-09-09 MED ORDER — NICOTINE 14 MG/24HR TD PT24
14.0000 mg | MEDICATED_PATCH | Freq: Every day | TRANSDERMAL | Status: DC
  Administered 2014-09-09: 14 mg via TRANSDERMAL
  Filled 2014-09-09 (×2): qty 1

## 2014-09-09 MED ORDER — ONDANSETRON HCL 4 MG PO TABS: 4.0000 mg | ORAL_TABLET | Freq: Four times a day (QID) | ORAL | Status: DC | PRN

## 2014-09-09 MED ORDER — SENNOSIDES-DOCUSATE SODIUM 8.6-50 MG PO TABS: 1.0000 | ORAL_TABLET | Freq: Every evening | ORAL | Status: DC | PRN

## 2014-09-09 MED ORDER — HYDROMORPHONE HCL 2 MG/ML IJ SOLN
2.0000 mg | INTRAMUSCULAR | Status: DC | PRN
  Administered 2014-09-09: 2 mg via INTRAVENOUS
  Filled 2014-09-09: qty 1

## 2014-09-09 MED ORDER — HYDROMORPHONE HCL 1 MG/ML IJ SOLN
2.0000 mg | INTRAMUSCULAR | Status: AC | PRN
  Administered 2014-09-09 (×2): 2 mg via INTRAVENOUS
  Filled 2014-09-09 (×2): qty 2

## 2014-09-09 MED ORDER — ONDANSETRON HCL 4 MG/2ML IJ SOLN: 4.0000 mg | Freq: Four times a day (QID) | INTRAMUSCULAR | Status: DC | PRN

## 2014-09-09 MED ORDER — HYDROMORPHONE HCL 1 MG/ML IJ SOLN
2.0000 mg | INTRAMUSCULAR | Status: DC | PRN
  Administered 2014-09-09 (×6): 2 mg via INTRAVENOUS
  Filled 2014-09-09 (×7): qty 2

## 2014-09-09 NOTE — ED Notes (Signed)
Pt refusing to go with [facility] until he gets more pain medication. PA made aware.

## 2014-09-09 NOTE — H&P (Signed)
Date: 09/09/2014               Patient Name:  Kyle [name] MRN: [id number]  DOB: 1975/11/16 Age / Sex: 38 y.o., male   PCP: No Pcp Per Patient         Medical Service: Internal Medicine Teaching Service         Attending Physician: Dr. Earl Lagos, MD    First Contact: Dr. Valentino Saxon Pager: [phone]  Second Contact: Dr. Yetta Barre Pager: [phone]       After Hours (After 5p/  First Contact Pager: [phone]  weekends / holidays): Second Contact Pager: [phone]   Chief Complaint: Malaise, fever  History of Present Illness: Kyle [name] is a 38 year old man with history of polysubstance abuse (IV heroin, amphetamine use), chronic back pain s/p lumbar laminectomy in 2009, asthma presenting with malaise and fever. He back started to hurt more about 1 week ago. He started feeling malaise 4 days ago. He then had subjective fevers for the past 3 days. He has a little chest soreness that is across his entire chest x 4 days. It feels dull and is constant. No exacerbating or relieving factors. It is similar to the chest soreness he had when he was hospitalized here 08/16/2014. He reports last IV drug use (heroin) was 2 weeks ago and he had some extended release morphine in the past two days.  He denies night sweats, vision changes, cough, shortness of breath, nausea, vomiting, abdominal pain, diarrhea, dysuria, hematuria, rash, bleeding/bruising problems, edema. He has chronic R leg paresthesias. No changes recently.  He was hospitalized here 08/16/2014 with MRSA bacteremia. CTA of the chest showed multifocal ground glass opacities concerning for septic emboli. He received IV vanc. He left AMA on 08/17/2014 before echocardiogram could be performed. He then went to [facility] 08/17/2014 where a TEE was performed which demonstrated no evidence of vegetations or abscess. CT chest was performed showing mediastinitis and cellulitis , multifocal lung nodulations and consolidations concerning for  septic emboli. CT surgery consulted and recommended continued antibiotic therapy. Repeat CT chest showed improvement of mediastinitis. He was continued on vancomycin. A PICC line was placed after blood cultures remained clear for 48 hours. He had an MRI of the thoracic and lumbar spine which was concerning for lumbar osteomyelitis. Neurosurgery was consulted and recommended 6 weeks of antibiotics. On 08/25/2014 he was found to have syringes and crushed pills. He was recommended for extended inpatient stay or nursing facility for duration of treatment as he could not be discharged with PICC. He decided to leave AMA and his PICC was removed prior to leaving. He was prescribed ciprofloxacin 750mg  BID and Rifampin 600mg  daily for 7 weeks. He reports he has been compliant of these antibiotics but says he has half of the ciprofloxacin left and finished the rifampin.  Meds: Medications Prior to Admission  Medication Sig Dispense Refill  . albuterol (PROVENTIL HFA;VENTOLIN HFA) 108 (90 BASE) MCG/ACT inhaler Inhale 2 puffs into the lungs every 6 (six) hours as needed for wheezing or shortness of breath (wheezing & shortness of breath).       . ciprofloxacin (CIPRO) 750 MG tablet Take 750 mg by mouth 2 (two) times daily.      Marland Kitchen gabapentin (NEURONTIN) 300 MG capsule Take 300 mg by mouth 3 (three) times daily.       Allergies: Allergies as of 09/08/2014 - Review Complete 09/08/2014  Allergen Reaction Noted  . Penicillins Anaphylaxis 08/31/2008  . Banana  03/07/2014  .  Carrot [daucus carota]  03/07/2014  . Celery oil  03/07/2014  . Other  03/07/2014   Past Medical History  Diagnosis Date  . Asthma   . DDD (degenerative disc disease), cervical   . Spondylosis    Past Surgical History  Procedure Laterality Date  . Spine surgery    . Tonsillectomy     History reviewed. No pertinent family history. History   Social History  . Marital Status: Single    Spouse Name: N/A    Number of Children: N/A  .  Years of Education: N/A   Occupational History  . Not on file.   Social History Main Topics  . Smoking status: Current Every Day Smoker -- 0.50 packs/day    Types: Cigarettes  . Smokeless tobacco: Never Used  . Alcohol Use: No  . Drug Use: Yes    Special: Amphetamines  . Sexual Activity: Not on file   Other Topics Concern  . Not on file   Social History Narrative  . No narrative on file    Review of Systems: Constitutional: +fevers/chills Eyes: no vision changes Ears, nose, mouth, throat, and face: no cough Respiratory: no shortness of breath Cardiovascular: +chest pain Gastrointestinal: no nausea/vomiting, no abdominal pain, no constipation, no diarrhea Genitourinary: no dysuria, no hematuria Integument: no rash Hematologic/lymphatic: no bleeding/bruising, no edema Musculoskeletal: +arthralgias, +myalgias Neurological: +chronic paresthesias, +chronic RLE weakness   Physical Exam: Blood pressure 128/67, pulse 76, temperature 99 F (37.2 C), temperature source Oral, resp. rate 18, height 5\' 7"  (1.702 m), weight 156 lb 3.2 oz (70.852 kg), SpO2 98.00%. General Apperance: NAD Head: Normocephalic, atraumatic Eyes: PERRL, EOMI, anicteric sclera Ears: Normal external ear canal Nose: Nares normal, septum midline, mucosa normal Throat: Lips, mucosa and tongue normal  Neck: Supple, trachea midline Back: No tenderness or bony abnormality  Lungs: Clear to auscultation bilaterally. No wheezes, rhonchi or rales. Breathing comfortably Chest Wall: Tender anterior chest wall, no deformity Heart: Regular rate and rhythm, no murmur/rub/gallop Abdomen: Soft, nontender, nondistended, no rebound/guarding Extremities: Normal, atraumatic, warm and well perfused, no edema Pulses: 2+ throughout Skin: No rashes or lesions Neurologic: Alert and oriented x 3. Normal strength and sensation   Lab results: Basic Metabolic Panel:  Recent Labs  16/08/9609/30/15 1535  NA 136*  K 4.5  CL 95*    CO2 28  GLUCOSE 117*  BUN 9  CREATININE 1.08  CALCIUM 9.1   Liver Function Tests:  Recent Labs  09/08/14 1535  AST 28  ALT 32  ALKPHOS 98  BILITOT 0.4  PROT 8.0  ALBUMIN 3.3*   CBC:  Recent Labs  09/08/14 1535  WBC 8.1  NEUTROABS 5.7  HGB 13.2  HCT 39.9  MCV 86.7  PLT 144*   Cardiac Enzymes:  Recent Labs  09/08/14 1824  TROPONINI <0.30   Urine Drug Screen: Drugs of Abuse     Component Value Date/Time   LABOPIA POSITIVE* 08/16/2014 0247   COCAINSCRNUR NONE DETECTED 08/16/2014 0247   LABBENZ NONE DETECTED 08/16/2014 0247   AMPHETMU POSITIVE* 08/16/2014 0247   THCU NONE DETECTED 08/16/2014 0247   LABBARB NONE DETECTED 08/16/2014 0247    Urinalysis:  Recent Labs  09/08/14 1657  COLORURINE YELLOW  LABSPEC 1.019  PHURINE 6.5  GLUCOSEU NEGATIVE  HGBUR NEGATIVE  BILIRUBINUR NEGATIVE  KETONESUR NEGATIVE  PROTEINUR NEGATIVE  UROBILINOGEN 0.2  NITRITE NEGATIVE  LEUKOCYTESUR NEGATIVE   Misc. Labs: Lactic acid 09/08/2014 1835 2.03 Lactic acid 09/08/2014 1624 1.93  Imaging results:  [facility]  09/08/2014   CLINICAL DATA:  Endocarditis.  EXAM: PORTABLE CHEST - 1 VIEW  COMPARISON:  08/15/2014  FINDINGS: The heart size and mediastinal contours are within normal limits. Both lungs are clear. The visualized skeletal structures are unremarkable.  IMPRESSION: No active disease.   Electronically Signed   By: Maryclare Bean M.D.   On: 09/08/2014 16:37    Other results: EKG: Sinus tachycardia. Unchanged from previous EKG  Assessment & Plan by Problem: Principal Problem:   MRSA bacteremia Active Problems:   Heroin abuse   Amphetamine abuse   Abnormal chest CT   Osteomyelitis of lumbar spine   Mediastinitis  MRSA bacteremia w/ lumbar spine osteo: Likely from IV drug use. Pt left [facility] b/f ECHO due to inadequate pain control and went to [facility] where ECHO w/o vegetation. Has ground-glass opacity on CT chest concerning for septic emboli. MRI spine done at [facility]  which showed osteo and repeat CT chest w/ mediastinitis; CT surg consulted at [facility] and rec continued IV abx, Neurosurgery also consulted and recommended 6 weeks IV abx therapy but no surgery. PICC placed at [facility], but pt found with needles and crushed meds in bed and left AMA w/o PICC 10/16. Returns today with fevers and chest pain, which is stable and mild per pt.  - Admit to tele  - Vanc 750mg  Q8hr - Blood cultures pending - Vitals q4h  - Dilaudid 2mg  q2 PRN, will likely need to increase. May consider long acting pain med: was on MS contin 30mg  Q8hr at [facility] - Consider ID consult in AM  - Consider neurosurg consult in AM  - UDS was not collected at [facility] prior to transfer. Will obtain one here.  Chest pain: Likely due to septic emboli to the lung and mediastinitis seen on CT chest here during last admission and at [facility] 2 weeks ago. EKG w/o changes. Troponins negative at last admission. Given that his pain is stable and his temperature on admission was low-grade at 100.5, will hold off on rescanning his chest at this time. If the pain worsens or if he becomes febrile, will consider re-scanning his chest and re-imaging his spine.  - Restarting abx.   Heroin and amphetamine use in setting of newly diagnosed Hep C: HIV neg. Hep C Ab + and Hepatitis C Vrs RNA positive but no additional labs have been obtained. LFTs wnl. Mildly decreased albumin at 3.3 - CSW consult for cessation  - Hepatitis A antibody, Hepatitis B surface antibody and antigen, and core antibody, HCV RNA quant and genotype pending. - PT/INR pending. - Will need u/s abdomen complete w/ elastography and [facility] referral   Chronic pain: Per chart review, pt has a h/o ankylosing spondylitis. He is s/p laminectomy '09 for disk herniation w/ postop course complicated by wound infection. He is apparently followed by Neurosurgery here at [facility], Dr. Mikal Plane performed the surgery.  - Will need to contact Neurosurgery in the am regarding  osteomyelitis findings.  - continue home gabapentin 300mg  TID  FEN:  -Regular diet -NPO after midnight -NS @ 165ml/hr  DVT PPx: [place] Heparin  Dispo: Disposition is deferred at this time, awaiting improvement of current medical problems. Anticipated discharge in approximately 3+ day(s).   The patient does not have a current PCP (No Pcp Per Patient) and does not need an [facility] hospital follow-up appointment after discharge.  The patient does not have transportation limitations that hinder transportation to clinic appointments.  Signed: Griffin Basil, MD 09/09/2014,  1:50 AM

## 2014-09-09 NOTE — Consult Note (Signed)
[place] [facility] Infectious Disease  Total days of antibiotics 1        Day 1 vanco        ( antistaphylococcal treatmetn since 10/13)       Reason for Consult: mrsa lumbar osteomyelitis    Referring Physician: Lysbeth Galas  Principal Problem:   MRSA bacteremia Active Problems:   Heroin abuse   Amphetamine abuse   Abnormal chest CT   Osteomyelitis of lumbar spine   Mediastinitis    HPI: Kyle [name] is a 38 y.o. male with hx of ankylsoing spondylitis, laminectomy in 2009 c/b MRSA wound infection, chronic back pain, active IVDU, HCV with 11M VL who initially admitted at [facility] for MRSA bacteremia on 10/7-10/8, left AMA then subsequently admitted to [facility] from 10/8- 08/25/14. He was still found to have MRSA bacteremia( oxa-R, cipro-S, rif-S), cleared on blood cx 08/22/14 (thus possibly bacteremic from 10/7 -10/12). He underwent TEE no vegetation. He also had chest CT showing multifocal ground glass lesions c/w septic emboli, and anterior mediastinitis. MRI revealed L4-L5 diskitis. He initially had PICC line to be therapeutic on vancomycin. However, he was found to be using his picc line for illicit injection drug use. This prompted him leaving AMA, the removal of his picc line and being discharged on oral ciprofloxacin and rifampin. He states that he has been taking this medication but ran out of rifampin. He had Id follow up visit at [facility] scheduled but did not attend. He returns to [facility] on 10/30 for feeling poorly, fevers, and concern that one of his friends died of sepsis. On admit, he was found to have temp 100.5, but he does subscribe to having some withdrawal. Re-initiated on vancomycin. Repeat blood cx pending. He is an active drug user uses IV predominantly, he doesn't share his syringes. He denies that they found him using his picc line at [facility], but did find the syringes that he had in his belongings.  Past Medical History  Diagnosis Date  . Asthma   . DDD (degenerative  disc disease), cervical   . Spondylosis     Allergies:  Allergies  Allergen Reactions  . Penicillins Anaphylaxis  . Banana     Itchy throat   . Carrot [Daucus Carota]     Oral allergy syndrome   . Celery Oil     Oral allergy syndrome   . Other     Walnuts, pecans "oral allergy syndrome"    MEDICATIONS: . gabapentin  300 mg Oral TID  . heparin  5,000 Units Subcutaneous 3 times per day  . sodium chloride  3 mL Intravenous Q12H  . vancomycin  750 mg Intravenous Q8H    History  Substance Use Topics  . Smoking status: Current Every Day Smoker -- 0.50 packs/day    Types: Cigarettes  . Smokeless tobacco: Never Used  . Alcohol Use: No    History reviewed. No pertinent family history.   Review of Systems  Constitutional: positive for fever, chills, diaphoresis, activity change, appetite change, fatigue and unexpected weight change.  HENT: Negative for congestion, sore throat, rhinorrhea, sneezing, trouble swallowing and sinus pressure.  Eyes: Negative for photophobia and visual disturbance.  Respiratory: Negative for cough, chest tightness, shortness of breath, wheezing and stridor.  Cardiovascular: Negative for chest pain, palpitations and leg swelling.  Gastrointestinal: Negative for nausea, vomiting, abdominal pain, diarrhea, constipation, blood in stool, abdominal distention and anal bleeding.  Genitourinary: Negative for dysuria, hematuria, flank pain and difficulty urinating.  Musculoskeletal:  positive for back pain. Negative for myalgias, back pain, joint swelling, arthralgias and gait problem.  Skin: Negative for color change, pallor, rash and wound.  Neurological: Negative for dizziness, tremors, weakness and light-headedness.  Hematological: Negative for adenopathy. Does not bruise/bleed easily.  Psychiatric/Behavioral: Negative for behavioral problems, confusion, sleep disturbance, dysphoric mood, decreased concentration and agitation.     OBJECTIVE: Temp:   [98.1 F (36.7 C)-100.5 F (38.1 C)] 98.1 F (36.7 C) (10/31 0906) Pulse Rate:  [76-121] 93 (10/31 0906) Resp:  [10-23] 17 (10/31 0906) BP: (97-152)/(45-83) 97/45 mmHg (10/31 0906) SpO2:  [96 %-100 %] 97 % (10/31 0906) Weight:  [150 lb (68.04 kg)-156 lb 3.2 oz (70.852 kg)] 156 lb 3.2 oz (70.852 kg) (10/31 0144) Physical Exam  Constitutional: He is oriented to person, place, and time. He appears well-developed and well-nourished. No distress.  HENT:  Mouth/Throat: Oropharynx is clear and moist. No oropharyngeal exudate.  Cardiovascular: Normal rate, regular rhythm and normal heart sounds. Exam reveals no gallop and no friction rub.  No murmur heard.  Pulmonary/Chest: Effort normal and breath sounds normal. No respiratory distress. He has no wheezes.  Abdominal: Soft. Bowel sounds are normal. He exhibits no distension. There is no tenderness.  Lymphadenopathy:  He has no cervical adenopathy.  Neurological: He is alert and oriented to person, place, and time.  Skin: Skin is warm and dry. No rash noted. No erythema.  Psychiatric: He has a normal mood and affect. His behavior is normal.    LABS: Results for orders placed during the hospital encounter of 09/08/14 (from the past 48 hour(s))  CULTURE, BLOOD (ROUTINE X 2)     Status: None   Collection Time    09/08/14  3:35 PM      Result Value Ref Range   Specimen Description BLOOD LEFT FOREARM     Special Requests BOTTLES DRAWN AEROBIC AND ANAEROBIC 5 ML     Culture  Setup Time       Value: 09/08/2014 21:44     Performed at [facility]   Culture       Value:        BLOOD CULTURE RECEIVED NO GROWTH TO DATE CULTURE WILL BE HELD FOR 5 DAYS BEFORE ISSUING A FINAL NEGATIVE REPORT     Performed at [facility]   Report Status PENDING    CBC WITH DIFFERENTIAL     Status: Abnormal   Collection Time    09/08/14  3:35 PM      Result Value Ref Range   WBC 8.1  4.0 - 10.5 K/uL   RBC 4.60  4.22 - 5.81 MIL/uL   Hemoglobin  13.2  13.0 - 17.0 g/dL   HCT 39.9  39.0 - 52.0 %   MCV 86.7  78.0 - 100.0 fL   MCH 28.7  26.0 - 34.0 pg   MCHC 33.1  30.0 - 36.0 g/dL   RDW 13.3  11.5 - 15.5 %   Platelets 144 (*) 150 - 400 K/uL   Neutrophils Relative % 71  43 - 77 %   Neutro Abs 5.7  1.7 - 7.7 K/uL   Lymphocytes Relative 20  12 - 46 %   Lymphs Abs 1.6  0.7 - 4.0 K/uL   Monocytes Relative 9  3 - 12 %   Monocytes Absolute 0.8  0.1 - 1.0 K/uL   Eosinophils Relative 0  0 - 5 %   Eosinophils Absolute 0.0  0.0 - 0.7 K/uL   Basophils  Relative 0  0 - 1 %   Basophils Absolute 0.0  0.0 - 0.1 K/uL  COMPREHENSIVE METABOLIC PANEL     Status: Abnormal   Collection Time    09/08/14  3:35 PM      Result Value Ref Range   Sodium 136 (*) 137 - 147 mEq/L   Potassium 4.5  3.7 - 5.3 mEq/L   Chloride 95 (*) 96 - 112 mEq/L   CO2 28  19 - 32 mEq/L   Glucose, Bld 117 (*) 70 - 99 mg/dL   BUN 9  6 - 23 mg/dL   Creatinine, Ser 1.08  0.50 - 1.35 mg/dL   Calcium 9.1  8.4 - 10.5 mg/dL   Total Protein 8.0  6.0 - 8.3 g/dL   Albumin 3.3 (*) 3.5 - 5.2 g/dL   AST 28  0 - 37 U/L   ALT 32  0 - 53 U/L   Alkaline Phosphatase 98  39 - 117 U/L   Total Bilirubin 0.4  0.3 - 1.2 mg/dL   GFR calc non Af Amer 87 (*) >90 mL/min   GFR calc Af Amer >90  >90 mL/min   Comment: (NOTE)     The eGFR has been calculated using the CKD EPI equation.     This calculation has not been validated in all clinical situations.     eGFR's persistently <90 mL/min signify possible Chronic Kidney     Disease.   Anion gap 13  5 - 15  CULTURE, BLOOD (ROUTINE X 2)     Status: None   Collection Time    09/08/14  3:40 PM      Result Value Ref Range   Specimen Description BLOOD LEFT FOREARM     Special Requests BOTTLES DRAWN AEROBIC AND ANAEROBIC 5 ML     Culture  Setup Time       Value: 09/08/2014 21:44     Performed at [facility]   Culture       Value:        BLOOD CULTURE RECEIVED NO GROWTH TO DATE CULTURE WILL BE HELD FOR 5 DAYS BEFORE ISSUING A FINAL  NEGATIVE REPORT     Performed at [facility]   Report Status PENDING    I-STAT CG4 LACTIC ACID, ED     Status: None   Collection Time    09/08/14  4:24 PM      Result Value Ref Range   Lactic Acid, Venous 1.93  0.5 - 2.2 mmol/L  URINALYSIS, ROUTINE W REFLEX MICROSCOPIC     Status: None   Collection Time    09/08/14  4:57 PM      Result Value Ref Range   Color, Urine YELLOW  YELLOW   APPearance CLEAR  CLEAR   Specific Gravity, Urine 1.019  1.005 - 1.030   pH 6.5  5.0 - 8.0   Glucose, UA NEGATIVE  NEGATIVE mg/dL   Hgb urine dipstick NEGATIVE  NEGATIVE   Bilirubin Urine NEGATIVE  NEGATIVE   Ketones, ur NEGATIVE  NEGATIVE mg/dL   Protein, ur NEGATIVE  NEGATIVE mg/dL   Urobilinogen, UA 0.2  0.0 - 1.0 mg/dL   Nitrite NEGATIVE  NEGATIVE   Leukocytes, UA NEGATIVE  NEGATIVE   Comment: MICROSCOPIC NOT DONE ON URINES WITH NEGATIVE PROTEIN, BLOOD, LEUKOCYTES, NITRITE, OR GLUCOSE <1000 mg/dL.  URINE RAPID DRUG SCREEN (HOSP PERFORMED)     Status: Abnormal   Collection Time    09/08/14  4:57 PM  Result Value Ref Range   Opiates POSITIVE (*) NONE DETECTED   Cocaine POSITIVE (*) NONE DETECTED   Benzodiazepines NONE DETECTED  NONE DETECTED   Amphetamines NONE DETECTED  NONE DETECTED   Tetrahydrocannabinol NONE DETECTED  NONE DETECTED   Barbiturates NONE DETECTED  NONE DETECTED   Comment:            DRUG SCREEN FOR MEDICAL PURPOSES     ONLY.  IF CONFIRMATION IS NEEDED     FOR ANY PURPOSE, NOTIFY LAB     WITHIN 5 DAYS.                LOWEST DETECTABLE LIMITS     FOR URINE DRUG SCREEN     Drug Class       Cutoff (ng/mL)     Amphetamine      1000     Barbiturate      200     Benzodiazepine   749     Tricyclics       449     Opiates          300     Cocaine          300     THC              50     Performed at [facility]  TROPONIN I     Status: None   Collection Time    09/08/14  6:24 PM      Result Value Ref Range   Troponin I <0.30  <0.30 ng/mL    Comment:            Due to the release kinetics of cTnI,     a negative result within the first hours     of the onset of symptoms does not rule out     myocardial infarction with certainty.     If myocardial infarction is still suspected,     repeat the test at appropriate intervals.  I-STAT CG4 LACTIC ACID, ED     Status: None   Collection Time    09/08/14  6:35 PM      Result Value Ref Range   Lactic Acid, Venous 2.03  0.5 - 2.2 mmol/L  I-STAT TROPOININ, ED     Status: None   Collection Time    09/08/14  6:44 PM      Result Value Ref Range   Troponin i, poc 0.04  0.00 - 0.08 ng/mL   Comment 3            Comment: Due to the release kinetics of cTnI,     a negative result within the first hours     of the onset of symptoms does not rule out     myocardial infarction with certainty.     If myocardial infarction is still suspected,     repeat the test at appropriate intervals.  BASIC METABOLIC PANEL     Status: Abnormal   Collection Time    09/09/14  5:53 AM      Result Value Ref Range   Sodium 139  137 - 147 mEq/L   Potassium 4.0  3.7 - 5.3 mEq/L   Chloride 104  96 - 112 mEq/L   CO2 22  19 - 32 mEq/L   Glucose, Bld 90  70 - 99 mg/dL   BUN 7  6 - 23 mg/dL   Creatinine, Ser 0.90  0.50 - 1.35  mg/dL   Calcium 8.2 (*) 8.4 - 10.5 mg/dL   GFR calc non Af Amer >90  >90 mL/min   GFR calc Af Amer >90  >90 mL/min   Comment: (NOTE)     The eGFR has been calculated using the CKD EPI equation.     This calculation has not been validated in all clinical situations.     eGFR's persistently <90 mL/min signify possible Chronic Kidney     Disease.   Anion gap 13  5 - 15  CBC     Status: Abnormal   Collection Time    09/09/14  5:53 AM      Result Value Ref Range   WBC 6.5  4.0 - 10.5 K/uL   RBC 4.39  4.22 - 5.81 MIL/uL   Hemoglobin 12.7 (*) 13.0 - 17.0 g/dL   HCT 37.5 (*) 39.0 - 52.0 %   MCV 85.4  78.0 - 100.0 fL   MCH 28.9  26.0 - 34.0 pg   MCHC 33.9  30.0 - 36.0 g/dL   RDW 13.2   11.5 - 15.5 %   Platelets 133 (*) 150 - 400 K/uL  PROTIME-INR     Status: None   Collection Time    09/09/14  5:53 AM      Result Value Ref Range   Prothrombin Time 14.4  11.6 - 15.2 seconds   INR 1.11  0.00 - 1.49  TROPONIN I     Status: None   Collection Time    09/09/14 10:45 AM      Result Value Ref Range   Troponin I <0.30  <0.30 ng/mL   Comment:            Due to the release kinetics of cTnI,     a negative result within the first hours     of the onset of symptoms does not rule out     myocardial infarction with certainty.     If myocardial infarction is still suspected,     repeat the test at appropriate intervals.  TROPONIN I     Status: None   Collection Time    09/09/14  1:58 PM      Result Value Ref Range   Troponin I <0.30  <0.30 ng/mL   Comment:            Due to the release kinetics of cTnI,     a negative result within the first hours     of the onset of symptoms does not rule out     myocardial infarction with certainty.     If myocardial infarction is still suspected,     repeat the test at appropriate intervals.    MICRO: 10/30 blood cx pending 10/30 urine cx pending IMAGING: Dg Chest Port 1 View  09/08/2014   CLINICAL DATA:  Endocarditis.  EXAM: PORTABLE CHEST - 1 VIEW  COMPARISON:  08/15/2014  FINDINGS: The heart size and mediastinal contours are within normal limits. Both lungs are clear. The visualized skeletal structures are unremarkable.  IMPRESSION: No active disease.   Electronically Signed   By: Maryclare Bean M.D.   On: 09/08/2014 16:37    HISTORICAL MICRO/IMAGING 10/12 blood cx 1 of 2 + MRSA ([facility]) 10/7 blood cx x 2 MRSA  Assessment/Plan:  38yo M with active IV drug use who was found to have mrsa bacteremia complicated by pulmonary septic emboli, mediastinitis, and lumbar osteomyelitis started on IV therapy from 10/13-10/16 then converted to oral  therapy of ciprofloxacin plus rifampin who now presents with recurrent fever, malaise and back  pain.  - continue with vancomycin which he will need MRSA therapy for a total of 6-8 wks using 10/13 as day 1. - if he has further bacteremia from cultures on 10/30, may need to repeat TEE. No need for echo at this time since he had the appropriate studies already done at [facility] on 10/12 which TEE negative for vegetations - he is a poor candidate to discharge home with picc line, due to being caught manipulating his line on 10/16 at [facility]. May need to consider alternative of long acting IV therapy vs. Going to snf for Iv therapy vs. Continuation with oral cipro and rifampin  In regards to hepatitis C = this can be addressed as an outpatient  Back pain = patient wonders if he can be placed on long acting opiate such as morphine or methadone  Laszlo Ellerby B. [facility] for Infectious [facility] [phone]

## 2014-09-09 NOTE — Progress Notes (Signed)
ANTIBIOTIC CONSULT NOTE - INITIAL  Pharmacy Consult for Vancomycin  Indication: MRSA bacteremia  Allergies  Allergen Reactions  . Penicillins Anaphylaxis  . Banana     Itchy throat   . Carrot [Daucus Carota]     Oral allergy syndrome   . Celery Oil     Oral allergy syndrome   . Other     Walnuts, pecans "oral allergy syndrome"    Patient Measurements: Height: 5\' 7"  (170.2 cm) Weight: 156 lb 3.2 oz (70.852 kg) IBW/kg (Calculated) : 66.1  Vital Signs: Temp: 99 F (37.2 C) (10/31 0144) Temp Source: Oral (10/31 0144) BP: 128/67 mmHg (10/31 0144) Pulse Rate: 76 (10/31 0144)  Labs:  Recent Labs  09/08/14 1535  WBC 8.1  HGB 13.2  PLT 144*  CREATININE 1.08   Estimated Creatinine Clearance: 88.4 ml/min (by C-G formula based on Cr of 1.08).   Medical History: Past Medical History  Diagnosis Date  . Asthma   . DDD (degenerative disc disease), cervical   . Spondylosis    Assessment: 38 y/o M with hx IVDA, MRSA bacteremia, [facility] Cipro/Rifampin as pt was not suitable for PICC, WBC WNL, renal function good, other labs as above.   Goal of Therapy:  Vancomycin trough level 15-20 mcg/ml  Plan:  -Vancomycin 750 mg IV q8h -Trend WBC, temp, renal function  -Drug levels as indicated   Abran DukeLedford, Sunnie Odden 09/09/2014,2:31 AM

## 2014-09-09 NOTE — H&P (Addendum)
Pt seen and examined. Please refer to resident note for details  In brief, Pt is a 38 y/o male with PMH of polysubstance abuse (heroin, amphetamines), chronic back pain s/p lumbar laminectomy, asthma p/e fevers,w orsening back pain. Pt was admitted to [facility] on 10/7 with similar complaints and found to have MRSA bacteremia with CT chest + for possible septic emboli. He left AMA and had further w.[facility] including a TEE (no vegetations) and CT chest (mediastinits, cellulitis and possible septic emboli). He was continued on vancomycin there and had an MRI which was concerning for lumbar osteomyelitis. Pt was recommended for extended inpatient stay or NF to complete therapy given possible abuse of PICC line for IV narcotics. He left AMA on PO cipro and rifampin instead of staying to complete IV vanco. He returns today with worsening back pain * 1 week and fevers * 3 days. No cough, no abd pain, no n/v, no diarrhea, no lightheadedness, no syncope, no focal weakness. He does complain of mild R LE weakness and parasthesias,  Exam: Gen: AAO*3, NAD CVS: RRR, normal heart sounds Pulm: CTA b/l Abd: soft, non tender, BS + Ext: no edema  Assessment and Plan: 38 y/o male with lumbar osteomyelitis, recent MRSA bacteremia and h/o polysubstance abuse  Recent MRSA bacteremia/lumbar osteomyelitis: - ID consult noted. C/w Iv vanco for 6-8 weeks (starting date 10/13) - Pt not to be discharged home with PICC given history of IVDA and recent attempt to use PICC for narcotics - f/u blood cx - If + will get repeat TEE - Pt will likely need to go to SNF for long term IV abx or d/c home on cipro and rifampin - c/w pain control.   Hep C: - Pt with newly diagnosed hep C - Will need outpatient f/u with ID  CP: - unlikely cardiac - troponins negative, pt with recent TEE - no further w/u for now  ADDENDUM: - repeat blood cultures were negative - Pt with history of recent MRSA but unkown etiology

## 2014-09-09 NOTE — Progress Notes (Signed)
Subjective: Kyle [name] still complains of sharp lower back pain that radiates down his R leg. He says this is unchanged from the past few months. When we summarized his story as provided by the night team, he generally agreed. He did not want to discuss his hepatitis C status but we told him we would get ID involved for his osteo and they would consider his hep c as well. I had called neurosurgery this morning and they said that without epidural involvement on imaging or focal neurologic deficits on exam, they recommend antibiotic treatment but given his history of incomplete antibiotic regimens and changing hospitals back and forth, they would consider repeat imaging in the future.  Objective: Vital signs in last 24 hours: Filed Vitals:   09/08/14 2330 09/08/14 2340 09/09/14 0144 09/09/14 0906  BP:  152/83 128/67 97/45  Pulse: 89  76 93  Temp:   99 F (37.2 C) 98.1 F (36.7 C)  TempSrc:   Oral Oral  Resp: 23  18 17   Height:   5\' 7"  (1.702 m)   Weight:   156 lb 3.2 oz (70.852 kg)   SpO2: 100%  98% 97%   Weight change:   Intake/Output Summary (Last 24 hours) at 09/09/14 1058 Last data filed at 09/09/14 0900  Gross per 24 hour  Intake    120 ml  Output      0 ml  Net    120 ml   Gen: A&O x 4, no acute distress, well developed, well nourished HEENT: Atraumatic, PERRL, EOMI, sclerae anicteric, moist mucous membranes Heart: Regular rate and rhythm, normal S1 S2, no murmurs, rubs, or gallops Lungs: Clear to auscultation bilaterally, respirations unlabored Back: no tenderness on palpation down spine until lumbar spine just superior to pelvis Abd: Soft, non-tender, non-distended, + bowel sounds, no hepatosplenomegaly Ext: No edema or cyanosis Neuro: CNII-XII intact, 5/5 strength in upper and lower extremities b/l   Lab Results: Basic Metabolic Panel:  Recent Labs Lab 09/08/14 1535 09/09/14 0553  NA 136* 139  K 4.5 4.0  CL 95* 104  CO2 28 22  GLUCOSE 117* 90  BUN 9 7    CREATININE 1.08 0.90  CALCIUM 9.1 8.2*   Liver Function Tests:  Recent Labs Lab 09/08/14 1535  AST 28  ALT 32  ALKPHOS 98  BILITOT 0.4  PROT 8.0  ALBUMIN 3.3*   CBC:  Recent Labs Lab 09/08/14 1535 09/09/14 0553  WBC 8.1 6.5  NEUTROABS 5.7  --   HGB 13.2 12.7*  HCT 39.9 37.5*  MCV 86.7 85.4  PLT 144* 133*   Cardiac Enzymes:  Recent Labs Lab 09/08/14 1824  TROPONINI <0.30   Coagulation:  Recent Labs Lab 09/09/14 0553  LABPROT 14.4  INR 1.11   Urine Drug Screen: Drugs of Abuse     Component Value Date/Time   LABOPIA POSITIVE* 09/08/2014 1657   COCAINSCRNUR POSITIVE* 09/08/2014 1657   LABBENZ NONE DETECTED 09/08/2014 1657   AMPHETMU NONE DETECTED 09/08/2014 1657   THCU NONE DETECTED 09/08/2014 1657   LABBARB NONE DETECTED 09/08/2014 1657  Urinalysis:  Recent Labs Lab 09/08/14 1657  COLORURINE YELLOW  LABSPEC 1.019  PHURINE 6.5  GLUCOSEU NEGATIVE  HGBUR NEGATIVE  BILIRUBINUR NEGATIVE  KETONESUR NEGATIVE  PROTEINUR NEGATIVE  UROBILINOGEN 0.2  NITRITE NEGATIVE  LEUKOCYTESUR NEGATIVE   Misc. Labs: i-stat lactic acid 2.03  Micro Results: No results found for this or any previous visit (from the past 240 hour(s)). Studies/Results: Dg Chest Port 1 [address]  09/08/2014   CLINICAL DATA:  Endocarditis.  EXAM: PORTABLE CHEST - 1 VIEW  COMPARISON:  08/15/2014  FINDINGS: The heart size and mediastinal contours are within normal limits. Both lungs are clear. The visualized skeletal structures are unremarkable.  IMPRESSION: No active disease.   Electronically Signed   By: Maryclare BeanArt  Hoss M.D.   On: 09/08/2014 16:37   Medications: I have reviewed the patient's current medications. Scheduled Meds: . gabapentin  300 mg Oral TID  . heparin  5,000 Units Subcutaneous 3 times per day  . sodium chloride  3 mL Intravenous Q12H  . vancomycin  750 mg Intravenous Q8H   Continuous Infusions: . sodium chloride 125 mL/hr at 09/09/14 0318   PRN Meds:.albuterol,  HYDROmorphone (DILAUDID) injection, ondansetron (ZOFRAN) IV, ondansetron, senna-docusate Assessment/Plan: Principal Problem:   MRSA bacteremia Active Problems:   Heroin abuse   Amphetamine abuse   Abnormal chest CT   Osteomyelitis of lumbar spine   Mediastinitis  #MRSA bacteremia w/ lumbar spine osteo: Likely from IV drug use. He has had intermittent antibiotic treatment between his stays here and [facility]. Imaging between the centers notable for ECHO w/o vegetation, ground-glass opacity on CT chest concerning for septic emboli, MRI spine done  which showed osteo and repeat CT chest w/ mediastinitis. I spoke to neurosurgery (see above) who recommend antibiotics and do not feel there is a surgical intervention necessary. We discussed with the patient that he likely needs long term abx and that he could not leave with a PICC as it is not safe. He did not believe us as he has had an outpatient PICC in the past and we told him ID would see him and consider his options. -appreciate neurosurgery - ID consult - cont vanc per pharmacy - f/u BCx x 2 - Vitals q4h  - Dilaudid 2mg  q2 PRN -cont cardiac monitoring  #Chest pain: Likely due to septic emboli to the lung and mediastinitis seen on CT chest here [facility] last admission [facility]. EKG w/o changes. Troponins negative x 1. Given that his pain is stable and his temperature on admission was low-grade at 100.5, will hold off on rescanning his chest at this time. If the pain worsens or if he becomes febrile, will consider re-scanning his chest and re-imaging his spine.  -see above -trend troponin x 3 -cardiac monitoring  #Heroin and amphetamine use in setting of newly diagnosed Hep C: HIV neg. Hep C Ab + and Hepatitis C Vrs RNA positive but no additional labs have been obtained. LFTs wnl. Mildly decreased albumin at 3.3. PT 14 INR 1.11 encouraging for liver function. - ID consult, CSW consult for cessation  - Hepatitis A  antibody, Hepatitis B surface antibody and antigen, and core antibody, HCV RNA quant and genotype pending.  - Will need u/s abdomen complete w/ elastography and [facility] referral   #Chronic pain: Per chart review, pt has a h/o ankylosing spondylitis. He is s/p laminectomy '09 for disk herniation w/ postop course complicated by wound infection. He is apparently [facility], Dr. Mikal Planeabell performed the surgery. As noted above, neurosurgery does not feel they are needed as part of care team at this time - continue home gabapentin 300mg  TID   #FEN:  -Regular diet  -NS @ 14025ml/hr   DVT PPx: [place] Heparin   Dispo: Disposition is deferred at this time, awaiting improvement of current medical problems.  Anticipated discharge in approximately 2 day(s).   The patient  does not have a current PCP (No Pcp Per Patient) and [facility] after discharge.  The patient does not know have transportation limitations that hinder transportation to clinic appointments.  .Services Needed at time of discharge: Y = Yes, Blank = No PT:   OT:   RN:   Equipment:   Other:     LOS: 1 day   Lorenda HatchetAdam L Malonie Tatum, MD 09/09/2014, 10:58 AM

## 2014-09-09 NOTE — Progress Notes (Signed)
Pt seen and examined. Please refer to my H and P note from today for further details

## 2014-09-09 NOTE — ED Notes (Signed)
MD paged regarding pt. Request for more pain medication.

## 2014-09-10 LAB — URINE CULTURE
COLONY COUNT: NO GROWTH
Culture: NO GROWTH

## 2014-09-10 MED ORDER — RIFAMPIN 300 MG PO CAPS: 600.0000 mg | ORAL_CAPSULE | Freq: Every day | ORAL | Status: AC

## 2014-09-10 MED ORDER — CIPROFLOXACIN HCL 750 MG PO TABS: 750.0000 mg | ORAL_TABLET | Freq: Two times a day (BID) | ORAL | Status: AC

## 2014-09-10 NOTE — Progress Notes (Signed)
Pt decided to leave after he was caught trying to shoot his oxy in his IV and signed out AMA. MD was notified and pt left with all of his belongings after the MD spoke with him.

## 2014-09-10 NOTE — Progress Notes (Signed)
?  Called to patient's room because patient had indicated to RN that he was going to leave AMA. Earlier in the night, patient was observed behavior suspicious for drug use. Security was called and had searched some of his belongings, though patient had possibly flushed several materials down the toilet at that point. In speaking with the patient, patient voiced a lot of anger regarding the fact that he had been monitored on camera without his knowing. He identified this as his primary reason for leaving the hospital and never intending to come back was his intrusion of his privacy with cameras. He was informed of the risks of leaving without receiving appropriate antibiotic treatment. He was also told that an antibiotic regimen would be ordered for him to take as an outpatient. He reported that he understood these risks and was still adamant in leaving the hospital.

## 2014-09-11 LAB — HCV RNA QUANT
HCV QUANT: 2648007 [IU]/mL — AB (ref <15)
HCV Quantitative Log: 6.42 {Log} — ABNORMAL HIGH (ref <1.18)

## 2014-09-11 LAB — ANA: Anti Nuclear Antibody(ANA): NEGATIVE

## 2014-09-12 ENCOUNTER — Other Ambulatory Visit: Payer: Self-pay | Admitting: Internal Medicine

## 2014-09-12 LAB — HEPATITIS C GENOTYPE

## 2014-09-14 LAB — CULTURE, BLOOD (ROUTINE X 2)
Culture: NO GROWTH
Culture: NO GROWTH

## 2014-09-15 NOTE — Discharge Summary (Signed)
PATIENT LEFT AMA    Name: Kyle [name] MRN: [id number] DOB: 10-27-1976 38 y.o. PCP: No Pcp Per Patient  Date of Admission: 09/08/2014  3:15 PM Date of Discharge: 09/15/2014 Attending Physician: No att. providers found  Discharge Diagnosis:  Principal Problem:   MRSA bacteremia Active Problems:   Heroin abuse   Amphetamine abuse   Abnormal chest CT   Osteomyelitis of lumbar spine   Mediastinitis  Discharge Medications:   Medication List    TAKE these medications        albuterol 108 (90 BASE) MCG/ACT inhaler  Commonly known as:  PROVENTIL HFA;VENTOLIN HFA  Inhale 2 puffs into the lungs every 6 (six) hours as needed for wheezing or shortness of breath (wheezing & shortness of breath).     ciprofloxacin 750 MG tablet  Commonly known as:  CIPRO  Take 1 tablet (750 mg total) by mouth 2 (two) times daily. For 8 weeks.     gabapentin 300 MG capsule  Commonly known as:  NEURONTIN  Take 300 mg by mouth 3 (three) times daily.     rifampin 300 MG capsule  Commonly known as:  RIFADIN  Take 2 capsules (600 mg total) by mouth daily. For eight weeks.        Disposition and follow-up:   Kyle [name] was discharged from [facility] in Good condition.  At the hospital follow up visit please address:  1.  MRSA Bacteremia/Osteomyelitis of Lumbar Spine: Please assess symptoms and if patient is taking is antibiotics as prescribed.   Hepatitis C: Please discuss Hepatitis C diagnosis with patient.   2.  Labs / imaging needed at time of follow-up: u/s abdomen complete w/ elastography   3.  Pending labs/ test needing follow-up: None  Consultations:  Infectious Disease  Procedures Performed:  Dg Chest Port 1 View  09/08/2014   CLINICAL DATA:  Endocarditis.  EXAM: PORTABLE CHEST - 1 VIEW  COMPARISON:  08/15/2014  FINDINGS: The heart size and mediastinal contours are within normal limits. Both lungs are clear. The  visualized skeletal structures are unremarkable.  IMPRESSION: No active disease.   Electronically Signed   By: Maryclare Bean M.D.   On: 09/08/2014 16:37   Admission HPI: Kyle [name] is a 38 year old man with history of polysubstance abuse (IV heroin, amphetamine use), chronic back pain s/p lumbar laminectomy in 2009, asthma presenting with malaise and fever. He back started to hurt more about 1 week ago. He started feeling malaise 4 days ago. He then had subjective fevers for the past 3 days. He has a little chest soreness that is across his entire chest x 4 days. It feels dull and is constant. No exacerbating or relieving factors. It is similar to the chest soreness he had when he was hospitalized here 08/16/2014. He reports last IV drug use (heroin) was 2 weeks ago and he had some extended release morphine in the past two days.  He denies night sweats, vision changes, cough, shortness of breath, nausea, vomiting, abdominal pain, diarrhea, dysuria, hematuria, rash, bleeding/bruising problems, edema. He has chronic R leg paresthesias. No changes recently.  He was hospitalized here 08/16/2014 with MRSA bacteremia. CTA of the chest showed multifocal ground glass opacities concerning for septic emboli. He received IV vanc. He left AMA  on 08/17/2014 before echocardiogram could be performed. [facility] then [facility]/17/2014 where a TEE was performed which demonstrated no evidence of vegetations or abscess. CT chest was performed showing mediastinitis and cellulitis , multifocal lung nodulations and consolidations concerning for septic emboli. CT surgery consulted and recommended continued antibiotic therapy. Repeat CT chest showed improvement of mediastinitis. He was continued on vancomycin. A PICC line was placed after blood cultures remained clear for 48 hours. He had an MRI of the thoracic and lumbar spine which was concerning for lumbar osteomyelitis. Neurosurgery was consulted and recommended 6  weeks of antibiotics. On 08/25/2014 he was found to have syringes and crushed pills. He was recommended for extended inpatient stay or nursing facility for duration of treatment as he could not be discharged with PICC. He decided to leave AMA and his PICC was removed prior to leaving. He was prescribed ciprofloxacin 750mg  BID and Rifampin 600mg  daily for 7 weeks. He reports he has been compliant of these antibiotics but says he has half of the ciprofloxacin left and finished the rifampin.  Hospital Course by problem list: Principal Problem:   MRSA bacteremia Active Problems:   Heroin abuse   Amphetamine abuse   Abnormal chest CT   Osteomyelitis of lumbar spine   Mediastinitis   MRSA bacteremia w/ lumbar spine osteo: Likely from IV drug use. Pt left [facility] b/f ECHO due to inadequate pain control and [facility] where ECHO w/o vegetation. Has ground-glass opacity on CT chest concerning for septic emboli. [facility] which showed osteo and repeat CT chest w/ mediastinitis; CT [facility] and rec continued IV abx, Neurosurgery also consulted and recommended 6 weeks IV abx therapy but no surgery. [facility], but pt found with needles and crushed meds in bed and left AMA w/o PICC 10/16. Patient returned with fevers and chest pain. We discussed with the patient that he likely needs long term abx and that he could not leave with a PICC as it is not safe. He did not believe us as he has had an outpatient PICC in the past and we told him ID would see him and consider his options. Night team was called to patient's room because patient had indicated to RN that he was going to leave AMA. Earlier in the night, patient was observed behavior suspicious for drug use. Security was called and had searched some of his belongings, though patient had possibly flushed several materials down the toilet at that point. In speaking with the patient, patient voiced a lot of anger regarding the fact that he  had been monitored on camera without his knowing. He identified this as his primary reason for leaving the hospital and never intending to come back was his intrusion of his privacy with cameras. He was informed of the risks of leaving without receiving appropriate antibiotic treatment. He was also told that an antibiotic regimen would be ordered for him to take as an outpatient. He reported that he understood these risks and was still adamant in leaving the hospital. The follow day, patient was ordered Ciprofloxacin 750 mg BID for 8 weeks and Rifampin BID for 8 weeks. This [facility]. I personally called the patient and informed him that this prescription was sent to the pharmacy where he could possibly get the first round for free. He could transfer the medications to his other pharmacy if he would prefer.  I ordered a refill so patient would get treatment for 8 weeks. Patient voiced his understanding.   Chest pain: Likely due to septic emboli to the lung and mediastinitis seen on CT chest here during last admission and [facility]. EKG w/o changes. Troponins were negative x 3. Given that his pain was stable and his temperature on admission was low-grade at 100.5, held off on rescanning his chest at this time. If the pain were to worsen or if he became febrile, we were considering re-scanning his chest and re-imaging his spine. However, patient left AMA.  Heroin and amphetamine use in setting of newly diagnosed Hep C: HIV neg. Hep C Ab + and Hepatitis C Vrs RNA positive but no additional labs have been obtained. LFTs wnl. Mildly decreased albumin at 3.3, PT 14, INR 1.11 encouraging for liver function. ID was consulted and CSW was consulted for cessation. Hepatitis A antibody non-reactive, Hepatitis B surface antibody positive and antigen negative, and core antibody non-reactive, HCV RNA quant was [id number] and genotype was 1a. Patient will need referral to  ID clinic. He also will need u/s abdomen complete w/ elastography and [facility] referral.  Chronic Pain: Per chart review, pt has a h/o ankylosing spondylitis. He is s/p laminectomy '09 for disk herniation w/ postop course complicated by wound infection. He is [facility], Dr. Mikal Planeabell performed the surgery. We continued his home gabapentin 300mg  TID.  Discharge Vitals:   BP 107/69 mmHg  Pulse 79  Temp(Src) 98.8 F (37.1 C) (Oral)  Resp 18  Ht 5\' 7"  (1.702 m)  Wt 70.852 kg (156 lb 3.2 oz)  BMI 24.46 kg/m2  SpO2 100%  Discharge Labs:  No results found for this or any previous visit (from the past 24 hour(s)).  Signed: Jill AlexandersAlexa Richardson, DO PGY-1 Internal Medicine Resident Pager # [phone] 09/15/2014 2:50 PM

## 2014-10-10 ENCOUNTER — Telehealth: Payer: Self-pay | Admitting: Internal Medicine

## 2014-10-10 NOTE — Telephone Encounter (Signed)
Have attempted to call patient 3 times to discuss Hepatitis lab results but patient fails to answer phone.

## 2015-11-07 ENCOUNTER — Emergency Department
Admission: EM | Admit: 2015-11-07 | Discharge: 2015-11-07 | Disposition: A | Discharge status: Home / Self Care | Payer: Self-pay | Attending: Emergency Medicine | Admitting: Emergency Medicine

## 2015-11-07 ENCOUNTER — Encounter: Payer: Self-pay | Admitting: *Deleted

## 2015-11-07 DIAGNOSIS — Z792 Long term (current) use of antibiotics: Secondary | ICD-10-CM | POA: Insufficient documentation

## 2015-11-07 DIAGNOSIS — M5412 Radiculopathy, cervical region: Secondary | ICD-10-CM | POA: Insufficient documentation

## 2015-11-07 DIAGNOSIS — Z79899 Other long term (current) drug therapy: Secondary | ICD-10-CM | POA: Insufficient documentation

## 2015-11-07 DIAGNOSIS — F17210 Nicotine dependence, cigarettes, uncomplicated: Secondary | ICD-10-CM | POA: Insufficient documentation

## 2015-11-07 DIAGNOSIS — Z880 Allergy status to penicillin: Secondary | ICD-10-CM | POA: Insufficient documentation

## 2015-11-07 HISTORY — DX: Ankylosing spondylitis of unspecified sites in spine: M45.9

## 2015-11-07 MED ORDER — TRAMADOL HCL 50 MG PO TABS: 50.0000 mg | ORAL_TABLET | Freq: Four times a day (QID) | ORAL | Status: DC | PRN

## 2015-11-07 MED ORDER — METHOCARBAMOL 500 MG PO TABS
1000.0000 mg | ORAL_TABLET | Freq: Once | ORAL | Status: AC
  Administered 2015-11-07: 1000 mg via ORAL
  Filled 2015-11-07: qty 2

## 2015-11-07 MED ORDER — TRAMADOL HCL 50 MG PO TABS
50.0000 mg | ORAL_TABLET | Freq: Once | ORAL | Status: AC
  Administered 2015-11-07: 50 mg via ORAL
  Filled 2015-11-07: qty 1

## 2015-11-07 MED ORDER — METHOCARBAMOL 750 MG PO TABS: 1500.0000 mg | ORAL_TABLET | Freq: Four times a day (QID) | ORAL | Status: DC

## 2015-11-07 MED ORDER — PREDNISONE 20 MG PO TABS
60.0000 mg | ORAL_TABLET | Freq: Once | ORAL | Status: AC
  Administered 2015-11-07: 60 mg via ORAL
  Filled 2015-11-07: qty 3

## 2015-11-07 MED ORDER — METHYLPREDNISOLONE 4 MG PO TBPK: ORAL_TABLET | ORAL | Status: DC

## 2015-11-07 NOTE — ED Provider Notes (Signed)
[facility] Emergency Department Provider Note  ____________________________________________  Time seen: Approximately 5:43 PM  I have reviewed the triage vital signs and the nursing notes.   HISTORY  Chief Complaint Neck Pain    HPI Kyle [name] is a 39 y.o. male patient complaining of left neck and shoulder pain secondary to sleeping on a couch 2 days ago. Patient stated intermitting tingling down his left arm.He denies any loss of function or strength to the left upper extremity. He states neck pain increases with right lateral movements and flexion. She does rate his pain as 8/10. Patient has a history of degenerative disc disease of the cervical and lumbar spine. Patient state his Neurontin is not helping with his neck pain.   Past Medical History  Diagnosis Date  . Asthma   . DDD (degenerative disc disease), cervical   . Spondylosis   . Ankylosing spondylitis [facility])     Patient Active Problem List   Diagnosis Date Noted  . Osteomyelitis of lumbar spine (HCC) 09/09/2014  . Mediastinitis 09/09/2014  . MRSA bacteremia 08/17/2014  . Chest pain 08/16/2014  . Dyspnea 08/16/2014  . Heroin abuse 08/16/2014  . Amphetamine abuse 08/16/2014  . Symptomatic premature ventricular contractions 08/16/2014  . Chronic back pain 08/16/2014  . Orthostatic hypotension 08/16/2014  . Abnormal chest CT 08/16/2014    Past Surgical History  Procedure Laterality Date  . Spine surgery    . Tonsillectomy      Current Outpatient Rx  Name  Route  Sig  Dispense  Refill  . albuterol (PROVENTIL HFA;VENTOLIN HFA) 108 (90 BASE) MCG/ACT inhaler   Inhalation   Inhale 2 puffs into the lungs every 6 (six) hours as needed for wheezing or shortness of breath (wheezing & shortness of breath).          . ciprofloxacin (CIPRO) 750 MG tablet   Oral   Take 750 mg by mouth 2 (two) times daily.         . ciprofloxacin (CIPRO) 750 MG tablet   Oral   Take 1 tablet (750 mg  total) by mouth 2 (two) times daily. For 8 weeks.   60 tablet   1   . gabapentin (NEURONTIN) 300 MG capsule   Oral   Take 300 mg by mouth 3 (three) times daily.         . methocarbamol (ROBAXIN-750) 750 MG tablet   Oral   Take 2 tablets (1,500 mg total) by mouth 4 (four) times daily.   40 tablet   0   . methylPREDNISolone (MEDROL DOSEPAK) 4 MG TBPK tablet      Take Tapered dose as directed   21 tablet   0   . rifampin (RIFADIN) 300 MG capsule   Oral   Take 2 capsules (600 mg total) by mouth daily. For eight weeks.   60 capsule   1   . traMADol (ULTRAM) 50 MG tablet   Oral   Take 1 tablet (50 mg total) by mouth every 6 (six) hours as needed for moderate pain.   12 tablet   0     Allergies Penicillins; Banana; Carrot; Celery oil; and Other  No family history on file.  Social History Social History  Substance Use Topics  . Smoking status: Current Every Day Smoker -- 0.50 packs/day    Types: Cigarettes  . Smokeless tobacco: Never Used  . Alcohol Use: No    Review of Systems Constitutional: No fever/chills Eyes: No visual changes. ENT:  No sore throat. Cardiovascular: Denies chest pain. Respiratory: Denies shortness of breath. Gastrointestinal: No abdominal pain.  No nausea, no vomiting.  No diarrhea.  No constipation. Genitourinary: Negative for dysuria. Musculoskeletal: Neck and left upper extremity pain Skin: Negative for rash. Neurological: Negative for headaches, focal weakness or numbness. Allergic/Immunilogical: See allergy list  10-point ROS otherwise negative.  ____________________________________________   PHYSICAL EXAM:  VITAL SIGNS: ED Triage Vitals  Enc Vitals Group     BP 11/07/15 1709 144/52 mmHg     Pulse Rate 11/07/15 1709 62     Resp 11/07/15 1709 20     Temp 11/07/15 1709 98.4 F (36.9 C)     Temp Source 11/07/15 1709 Oral     SpO2 11/07/15 1709 97 %     Weight 11/07/15 1709 170 lb (77.111 kg)     Height 11/07/15 1709   (1.702 m)     Head Cir --      Peak Flow --      Pain Score 11/07/15 1710 8     Pain Loc --      Pain Edu? --      Excl. in GC? --     Constitutional: Alert and oriented. Well appearing and in no acute distress. Eyes: Conjunctivae are normal. PERRL. EOMI. Head: Atraumatic. Nose: No congestion/rhinnorhea. Mouth/Throat: Mucous membranes are moist.  Oropharynx non-erythematous. Neck: No stridor.  No cervical spine tenderness to palpation. Decreased range of motion with right lateral movements of the neck and flexion of the neck. Cardiovascular: Normal rate, regular rhythm. Grossly normal heart sounds.  Good peripheral circulation. Respiratory: Normal respiratory effort.  No retractions. Lungs CTAB. Gastrointestinal: Soft and nontender. No distention. No abdominal bruits. No CVA tenderness. Musculoskeletal: Normocephalic the upper extremity. Patient has full and equal range of motion vascular intact.  Neurologic:  Normal speech and language. No gross focal neurologic deficits are appreciated. No gait instability. Skin:  Skin is warm, dry and intact. No rash noted. Psychiatric: Mood and affect are normal. Speech and behavior are normal.  ____________________________________________   LABS (all labs ordered are listed, but only abnormal results are displayed)  Labs Reviewed - No data to display ____________________________________________  EKG   ____________________________________________  RADIOLOGY   ____________________________________________   PROCEDURES  Procedure(s) performed: None  Critical Care performed: No  ____________________________________________   INITIAL IMPRESSION / ASSESSMENT AND PLAN / ED COURSE  Pertinent labs & imaging results that were available during my care of the patient were reviewed by me and considered in my medical decision making (see chart for details).  Radicular neck pain to left upper extremity. Patient given a prescription for  prednisone, Robaxin, and tramadol. Patient advised to follow-up with family doctor if condition persists. Return back to ER if condition worsens. ____________________________________________   FINAL CLINICAL IMPRESSION(S) / ED DIAGNOSES  Final diagnoses:  Cervical radicular pain      Joni Reining, PA-C 11/07/15 1758  Minna Antis, MD 11/08/15 2051

## 2015-11-07 NOTE — Discharge Instructions (Signed)
Cervical Radiculopathy Cervical radiculopathy means that a nerve in the neck is pinched or bruised. This can cause pain or loss of feeling (numbness) that runs from your neck to your arm and fingers. HOME CARE Managing Pain  Take over-the-counter and prescription medicines only as told by your doctor.  If directed, put ice on the injured or painful area.  Put ice in a plastic bag.  Place a towel between your skin and the bag.  Leave the ice on for 20 minutes, 2-3 times per day.  If ice does not help, you can try using heat. Take a warm shower or warm bath, or use a heat pack as told by your doctor.  You may try a gentle neck and shoulder massage. Activity  Rest as needed. Follow instructions from your doctor about any activities to avoid.  Do exercises as told by your doctor or physical therapist. General Instructions   If you were given a soft collar, wear it as told by your doctor.  Use a flat pillow when you sleep.  Keep all follow-up visits as told by your doctor. This is important. GET HELP IF:  Your condition does not improve with treatment. GET HELP RIGHT AWAY IF:   Your pain gets worse and is not controlled with medicine.  You lose feeling or feel weak in your hand, arm, face, or leg.  You have a fever.  You have a stiff neck.  You cannot control when you poop or pee (have incontinence).  You have trouble with walking, balance, or talking.   This information is not intended to replace advice given to you by your health care provider. Make sure you discuss any questions you have with your health care provider.   Document Released: 10/16/2011 Document Revised: 07/18/2015 Document Reviewed: 12/21/2014 Elsevier Interactive Patient Education 2016 [employer]  

## 2015-11-07 NOTE — ED Notes (Signed)
Pt reports sleeping wrong a few days ago, pt complains of neck and shoulder pain radiating down left arm

## 2015-11-22 ENCOUNTER — Emergency Department: Payer: Self-pay

## 2015-11-22 ENCOUNTER — Encounter: Payer: Self-pay | Admitting: *Deleted

## 2015-11-22 ENCOUNTER — Emergency Department
Admission: EM | Admit: 2015-11-22 | Discharge: 2015-11-22 | Disposition: A | Discharge status: Home / Self Care | Payer: Self-pay | Attending: Emergency Medicine | Admitting: Emergency Medicine

## 2015-11-22 DIAGNOSIS — M5412 Radiculopathy, cervical region: Secondary | ICD-10-CM | POA: Insufficient documentation

## 2015-11-22 DIAGNOSIS — Z880 Allergy status to penicillin: Secondary | ICD-10-CM | POA: Insufficient documentation

## 2015-11-22 DIAGNOSIS — Z792 Long term (current) use of antibiotics: Secondary | ICD-10-CM | POA: Insufficient documentation

## 2015-11-22 DIAGNOSIS — Z79899 Other long term (current) drug therapy: Secondary | ICD-10-CM | POA: Insufficient documentation

## 2015-11-22 DIAGNOSIS — M79602 Pain in left arm: Secondary | ICD-10-CM | POA: Insufficient documentation

## 2015-11-22 DIAGNOSIS — F17210 Nicotine dependence, cigarettes, uncomplicated: Secondary | ICD-10-CM | POA: Insufficient documentation

## 2015-11-22 MED ORDER — OXYCODONE-ACETAMINOPHEN 5-325 MG PO TABS
1.0000 | ORAL_TABLET | Freq: Once | ORAL | Status: AC
  Administered 2015-11-22: 1 via ORAL
  Filled 2015-11-22: qty 1

## 2015-11-22 MED ORDER — PREDNISONE 20 MG PO TABS
60.0000 mg | ORAL_TABLET | Freq: Once | ORAL | Status: AC
  Administered 2015-11-22: 60 mg via ORAL
  Filled 2015-11-22: qty 3

## 2015-11-22 MED ORDER — OXYCODONE-ACETAMINOPHEN 5-325 MG PO TABS: 1.0000 | ORAL_TABLET | Freq: Four times a day (QID) | ORAL | Status: DC | PRN

## 2015-11-22 MED ORDER — CYCLOBENZAPRINE HCL 10 MG PO TABS: 10.0000 mg | ORAL_TABLET | Freq: Three times a day (TID) | ORAL | Status: DC | PRN

## 2015-11-22 MED ORDER — PREDNISONE 10 MG (21) PO TBPK: ORAL_TABLET | ORAL | Status: DC

## 2015-11-22 MED ORDER — METHOCARBAMOL 750 MG PO TABS
750.0000 mg | ORAL_TABLET | Freq: Four times a day (QID) | ORAL | Status: DC
Start: 2015-11-22 — End: 2016-02-20

## 2015-11-22 NOTE — ED Notes (Signed)
States left neck pain radiating down his left arm, states was seen for the same complaint before but is not any better

## 2015-11-22 NOTE — ED Notes (Signed)
Pt states was seen approximately one week ago for neck pain and pain has not gotten any better.

## 2015-11-22 NOTE — Discharge Instructions (Signed)
Cervical Radiculopathy Cervical radiculopathy happens when a nerve in the neck (cervical nerve) is pinched or bruised. This condition can develop because of an injury or as part of the normal aging process. Pressure on the cervical nerves can cause pain or numbness that runs from the neck all the way down into the arm and fingers. Usually, this condition gets better with rest. Treatment may be needed if the condition does not improve.  CAUSES This condition may be caused by:  Injury.  Slipped (herniated) disk.  Muscle tightness in the neck because of overuse.  Arthritis.  Breakdown or degeneration in the bones and joints of the spine (spondylosis) due to aging.  Bone spurs that may develop near the cervical nerves. SYMPTOMS Symptoms of this condition include:  Pain that runs from the neck to the arm and hand. The pain can be severe or irritating. It may be worse when the neck is moved.  Numbness or weakness in the affected arm and hand. DIAGNOSIS This condition may be diagnosed based on symptoms, medical history, and a physical exam. You may also have tests, including:  X-rays.  CT scan.  MRI.  Electromyogram (EMG).  Nerve conduction tests. TREATMENT In many cases, treatment is not needed for this condition. With rest, the condition usually gets better over time. If treatment is needed, options may include:  Wearing a soft neck collar for short periods of time.  Physical therapy to strengthen your neck muscles.  Medicines, such as NSAIDs, oral corticosteroids, or spinal injections.  Surgery. This may be needed if other treatments do not help. Various types of surgery may be done depending on the cause of your problems. HOME CARE INSTRUCTIONS Managing Pain  Take over-the-counter and prescription medicines only as told by your health care provider.  If directed, apply ice to the affected area.  Put ice in a plastic bag.  Place a towel between your skin and the  bag.  Leave the ice on for 20 minutes, 2-3 times per day.  If ice does not help, you can try using heat. Take a warm shower or warm bath, or use a heat pack as told by your health care provider.  Try a gentle neck and shoulder massage to help relieve symptoms. Activity  Rest as needed. Follow instructions from your health care provider about any restrictions on activities.  Do stretching and strengthening exercises as told by your health care provider or physical therapist. General Instructions  If you were given a soft collar, wear it as told by your health care provider.  Use a flat pillow when you sleep.  Keep all follow-up visits as told by your health care provider. This is important. SEEK MEDICAL CARE IF:  Your condition does not improve with treatment. SEEK IMMEDIATE MEDICAL CARE IF:  Your pain gets much worse and cannot be controlled with medicines.  You have weakness or numbness in your hand, arm, face, or leg.  You have a high fever.  You have a stiff, rigid neck.  You lose control of your bowels or your bladder (have incontinence).  You have trouble with walking, balance, or speaking.   This information is not intended to replace advice given to you by your health care provider. Make sure you discuss any questions you have with your health care provider.   Document Released: 07/22/2001 Document Revised: 07/18/2015 Document Reviewed: 12/21/2014 Elsevier Interactive Patient Education 2016 [employer]   Take pain medicine as directed. Follow-up with the orthopedist next week for  further evaluation. Return to the emergency room for any worsening symptoms.

## 2015-11-22 NOTE — ED Provider Notes (Signed)
[facility] Emergency Department Provider Note  ____________________________________________  Time seen: Approximately 6:59 PM  I have reviewed the triage vital signs and the nursing notes.   HISTORY  Chief Complaint Arm Pain and Neck Pain    HPI Kyle [name] is a 40 y.o. male who was seen on December 28 for the same complaint. He has neck pain with radiation down the left arm. Symptoms improved with a course of steroids and muscle relaxants. However his pain has returned. He has left neck pain with radiation down to the hand dorsally. He denies numbness or tingling. Denies headache, fevers and chills.  He thinks his tricep muscle is a little weaker. No known injury. He did sleep on the couch prior to symptoms onset.   Past Medical History  Diagnosis Date  . Asthma   . DDD (degenerative disc disease), cervical   . Spondylosis   . Ankylosing spondylitis [facility])     Patient Active Problem List   Diagnosis Date Noted  . Osteomyelitis of lumbar spine (HCC) 09/09/2014  . Mediastinitis 09/09/2014  . MRSA bacteremia 08/17/2014  . Chest pain 08/16/2014  . Dyspnea 08/16/2014  . Heroin abuse 08/16/2014  . Amphetamine abuse 08/16/2014  . Symptomatic premature ventricular contractions 08/16/2014  . Chronic back pain 08/16/2014  . Orthostatic hypotension 08/16/2014  . Abnormal chest CT 08/16/2014    Past Surgical History  Procedure Laterality Date  . Spine surgery    . Tonsillectomy      Current Outpatient Rx  Name  Route  Sig  Dispense  Refill  . albuterol (PROVENTIL HFA;VENTOLIN HFA) 108 (90 BASE) MCG/ACT inhaler   Inhalation   Inhale 2 puffs into the lungs every 6 (six) hours as needed for wheezing or shortness of breath (wheezing & shortness of breath).          . ciprofloxacin (CIPRO) 750 MG tablet   Oral   Take 1 tablet (750 mg total) by mouth 2 (two) times daily. For 8 weeks.   60 tablet   1   . cyclobenzaprine (FLEXERIL) 10 MG tablet  Oral   Take 1 tablet (10 mg total) by mouth every 8 (eight) hours as needed for muscle spasms.   21 tablet   0   . gabapentin (NEURONTIN) 300 MG capsule   Oral   Take 300 mg by mouth 3 (three) times daily.         . methocarbamol (ROBAXIN-750) 750 MG tablet   Oral   Take 2 tablets (1,500 mg total) by mouth 4 (four) times daily.   40 tablet   0   . oxyCODONE-acetaminophen (ROXICET) 5-325 MG tablet   Oral   Take 1 tablet by mouth every 6 (six) hours as needed.   20 tablet   0   . predniSONE (STERAPRED UNI-PAK 21 TAB) 10 MG (21) TBPK tablet      6 tablets on day 1, 5 tablets on day 2, 4 tablets on day 3, etc...   21 tablet   0   . rifampin (RIFADIN) 300 MG capsule   Oral   Take 2 capsules (600 mg total) by mouth daily. For eight weeks.   60 capsule   1   . traMADol (ULTRAM) 50 MG tablet   Oral   Take 1 tablet (50 mg total) by mouth every 6 (six) hours as needed for moderate pain.   12 tablet   0     Allergies Penicillins; Banana; Carrot; Celery oil; and Other  History reviewed. No pertinent family history.  Social History Social History  Substance Use Topics  . Smoking status: Current Every Day Smoker -- 0.50 packs/day    Types: Cigarettes  . Smokeless tobacco: Never Used  . Alcohol Use: No    Review of SystemsConstitutional: No fever/chills Eyes: No visual changes. ENT: No sore throat. Cardiovascular: Denies chest pain. Respiratory: Denies shortness of breath. Gastrointestinal: No abdominal pain.  No nausea, no vomiting.  No diarrhea.  No constipation. Genitourinary: Negative for dysuria. Musculoskeletal: Negative for back pain. Skin: Negative for rash. Neurological: Negative for headaches, focal weakness or numbness. 10-point ROS otherwise negative.  ____________________________________________   PHYSICAL EXAM:  VITAL SIGNS: ED Triage Vitals  Enc Vitals Group     BP 11/22/15 1658 109/65 mmHg     Pulse Rate 11/22/15 1658 55     Resp  11/22/15 1658 18     Temp 11/22/15 1658 98.5 F (36.9 C)     Temp Source 11/22/15 1658 Oral     SpO2 11/22/15 1658 97 %     Weight 11/22/15 1658 160 lb (72.576 kg)     Height 11/22/15 1658 5\' 7"  (1.702 m)     Head Cir --      Peak Flow --      Pain Score 11/22/15 1659 7     Pain Loc --      Pain Edu? --      Excl. in GC? --     Constitutional: Alert and oriented. Well appearing and in no acute distress. Eyes: Conjunctivae are normal. PERRL. EOMI. Ears:  Clear with normal landmarks. No erythema. Head: Atraumatic. Nose: No congestion/rhinnorhea. Mouth/Throat: Mucous membranes are moist.  Oropharynx non-erythematous. No lesions. Neck:  Supple.  No adenopathy.  NO cervical tenderness. Moderate paracervical tenderness.  Cardiovascular: Normal rate, regular rhythm. Grossly normal heart sounds.  Good peripheral circulation. Respiratory: Normal respiratory effort.  No retractions. Lungs CTAB. Gastrointestinal: Soft and nontender. No distention. No abdominal bruits. No CVA tenderness. Musculoskeletal: Nml ROM of upper and lower extremity joints. Neurologic:  Normal speech and language. No gross focal neurologic deficits are appreciated. No gait instability. Skin:  Skin is warm, dry and intact. No rash noted. Psychiatric: Mood and affect are normal. Speech and behavior are normal.  ____________________________________________   LABS (all labs ordered are listed, but only abnormal results are displayed)  Labs Reviewed - No data to display ____________________________________________  EKG   ____________________________________________  RADIOLOGY  CLINICAL DATA: Left neck pain radiating into the left upper extremity for 2 weeks. No reported injury.  EXAM: CERVICAL SPINE - 2-3 VIEW  COMPARISON: None.  FINDINGS: On the lateral view the cervical spine is visualized to the level of the lower C7 endplate. There is mild straightening of the cervical spine, usually due to  positioning and/ or muscle spasm. Pre-vertebral soft tissues are within normal limits. No fracture is detected in the cervical spine. Dens is well positioned between the lateral masses of C1. There is mild degenerative disc disease in the mid to lower cervical spine, most prominent at C3-4 and C6-7. No cervical spine subluxation. No aggressive-appearing focal osseous lesions.  IMPRESSION: Mild degenerative disc disease in the mid to lower cervical spine.   Electronically Signed  By: Delbert PhenixJason A Poff M.D.  On: 11/22/2015 19:39 ____________________________________________   PROCEDURES  Procedure(s) performed: None  Critical Care performed: No  ____________________________________________   INITIAL IMPRESSION / ASSESSMENT AND PLAN / ED COURSE  Pertinent labs & imaging results that were available  during my care of the patient were reviewed by me and considered in my medical decision making (see chart for details).  39 year old male who was seen on December 28 for the same complaint. He has neck pain with left arm pain, consistent with cervical radiculopathy. C-spine films today along with prednisone, robaxin and percocet.  He will follow-up with the orthopedist for further evaluation, or return to the emergency room for any worsening symptoms. ____________________________________________   FINAL CLINICAL IMPRESSION(S) / ED DIAGNOSES  Final diagnoses:  Cervical radiculopathy      Ignacia Bayley, PA-C 11/22/15 1952  Minna Antis, MD 11/22/15 2309

## 2016-02-20 ENCOUNTER — Encounter: Payer: Self-pay | Admitting: *Deleted

## 2016-02-20 ENCOUNTER — Ambulatory Visit
Admission: EM | Admit: 2016-02-20 | Discharge: 2016-02-20 | Disposition: A | Discharge status: Home / Self Care | Payer: Self-pay | Attending: Family Medicine | Admitting: Family Medicine

## 2016-02-20 DIAGNOSIS — M5432 Sciatica, left side: Secondary | ICD-10-CM

## 2016-02-20 DIAGNOSIS — M549 Dorsalgia, unspecified: Secondary | ICD-10-CM

## 2016-02-20 DIAGNOSIS — G8929 Other chronic pain: Secondary | ICD-10-CM

## 2016-02-20 MED ORDER — KETOROLAC TROMETHAMINE 60 MG/2ML IM SOLN
60.0000 mg | Freq: Once | INTRAMUSCULAR | Status: AC
  Administered 2016-02-20: 60 mg via INTRAMUSCULAR

## 2016-02-20 MED ORDER — METHOCARBAMOL 750 MG PO TABS: 750.0000 mg | ORAL_TABLET | Freq: Three times a day (TID) | ORAL | Status: AC

## 2016-02-20 MED ORDER — OXYCODONE HCL 5 MG PO TABS: 5.0000 mg | ORAL_TABLET | Freq: Three times a day (TID) | ORAL | Status: AC | PRN

## 2016-02-20 MED ORDER — GABAPENTIN 300 MG PO CAPS: 300.0000 mg | ORAL_CAPSULE | Freq: Three times a day (TID) | ORAL | Status: AC

## 2016-02-20 NOTE — Discharge Instructions (Signed)
Take medication as prescribed. Rest. Drink plenty of fluids. Apply ice. Stretch. Avoid strenuous activity.   Follow up with your primary care physician this week. Follow up with orthopedic as needed.    Return to Urgent care or ER for numbness, tingling, urinary or bowel changes, increased pain, difficulty ambulating, new or worsening concerns.    Back Injury Prevention Back injuries can be very painful. They can also be difficult to heal. After having one back injury, you are more likely to injure your back again. It is important to learn how to avoid injuring or re-injuring your back. The following tips can help you to prevent a back injury. WHAT SHOULD I KNOW ABOUT PHYSICAL FITNESS?  Exercise for 30 minutes per day on most days of the week or as directed by your health care provider. Make sure to:  Do aerobic exercises, such as walking, jogging, biking, or swimming.  Do exercises that increase balance and strength, such as tai chi and yoga. These can decrease your risk of falling and injuring your back.  Do stretching exercises to help with flexibility.  Try to develop strong abdominal muscles. Your abdominal muscles provide a lot of the support that is needed by your back.  Maintain a healthy weight. This helps to decrease your risk of a back injury. WHAT SHOULD I KNOW ABOUT MY DIET?  Talk with your health care provider about your overall diet. Take supplements and vitamins only as directed by your health care provider.  Talk with your health care provider about how much calcium and vitamin D you need each day. These nutrients help to prevent weakening of the bones (osteoporosis). Osteoporosis can cause broken (fractured) bones, which lead to back pain.  Include good sources of calcium in your diet, such as dairy products, green leafy vegetables, and products that have had calcium added to them (fortified).  Include good sources of vitamin D in your diet, such as milk and foods  that are fortified with vitamin D. WHAT SHOULD I KNOW ABOUT MY POSTURE?  Sit up straight and stand up straight. Avoid leaning forward when you sit or hunching over when you stand.  Choose chairs that have good low-back (lumbar) support.  If you work at a desk, sit close to it so you do not need to lean over. Keep your chin tucked in. Keep your neck drawn back, and keep your elbows bent at a right angle. Your arms should look like the letter "L."  Sit high and close to the steering wheel when you drive. Add a lumbar support to your car seat, if needed.  Avoid sitting or standing in one position for very long. Take breaks to get up, stretch, and walk around at least one time every hour. Take breaks every hour if you are driving for long periods of time.  Sleep on your side with your knees slightly bent, or sleep on your back with a pillow under your knees. Do not lie on the front of your body to sleep. WHAT SHOULD I KNOW ABOUT LIFTING, TWISTING, AND REACHING? Lifting and Heavy Lifting  Avoid heavy lifting, especially repetitive heavy lifting. If you must do heavy lifting:  Stretch before lifting.  Work slowly.  Rest between lifts.  Use a tool such as a cart or a dolly to move objects if one is available.  Make several small trips instead of carrying one heavy load.  Ask for help when you need it, especially when moving big objects.  Follow these  steps when lifting:  Stand with your feet shoulder-width apart.  Get as close to the object as you can. Do not try to pick up a heavy object that is far from your body.  Use handles or lifting straps if they are available.  Bend at your knees. Squat down, but keep your heels off the floor.  Keep your shoulders pulled back, your chin tucked in, and your back straight.  Lift the object slowly while you tighten the muscles in your legs, abdomen, and buttocks. Keep the object as close to the center of your body as possible.  Follow  these steps when putting down a heavy load:  Stand with your feet shoulder-width apart.  Lower the object slowly while you tighten the muscles in your legs, abdomen, and buttocks. Keep the object as close to the center of your body as possible.  Keep your shoulders pulled back, your chin tucked in, and your back straight.  Bend at your knees. Squat down, but keep your heels off the floor.  Use handles or lifting straps if they are available. Twisting and Reaching  Avoid lifting heavy objects above your waist.  Do not twist at your waist while you are lifting or carrying a load. If you need to turn, move your feet.  Do not bend over without bending at your knees.  Avoid reaching over your head, across a table, or for an object on a high surface. WHAT ARE SOME OTHER TIPS?  Avoid wet floors and icy ground. Keep sidewalks clear of ice to prevent falls.  Do not sleep on a mattress that is too soft or too hard.  Keep items that are used frequently within easy reach.  Put heavier objects on shelves at waist level, and put lighter objects on lower or higher shelves.  Find ways to decrease your stress, such as exercise, massage, or relaxation techniques. Stress can build up in your muscles. Tense muscles are more vulnerable to injury.  Talk with your health care provider if you feel anxious or depressed. These conditions can make back pain worse.  Wear flat heel shoes with cushioned soles.  Avoid sudden movements.  Use both shoulder straps when carrying a backpack.  Do not use any tobacco products, including cigarettes, chewing tobacco, or electronic cigarettes. If you need help quitting, ask your health care provider.   This information is not intended to replace advice given to you by your health care provider. Make sure you discuss any questions you have with your health care provider.   Document Released: 12/04/2004 Document Revised: 03/13/2015 Document Reviewed:  10/31/2014 Elsevier Interactive Patient Education 2016 [employer] Inc[employer]  Chronic Pain Chronic pain can be defined as pain that is off and on and lasts for 3-6 months or longer. Many things cause chronic pain, which can make it difficult to make a diagnosis. There are many treatment options available for chronic pain. However, finding a treatment that works well for you may require trying various approaches until the right one is found. Many people benefit from a combination of two or more types of treatment to control their pain. SYMPTOMS  Chronic pain can occur anywhere in the body and can range from mild to very severe. Some types of chronic pain include:  Headache.  Low back pain.  Cancer pain.  Arthritis pain.  Neurogenic pain. This is pain resulting from damage to nerves. People with chronic pain may also have other symptoms such as:  Depression.  Anger.  Insomnia.  Anxiety. DIAGNOSIS  Your health care provider will help diagnose your condition over time. In many cases, the initial focus will be on excluding possible conditions that could be causing the pain. Depending on your symptoms, your health care provider may order tests to diagnose your condition. Some of these tests may include:   Blood tests.   CT scan.   MRI.   X-rays.   Ultrasounds.   Nerve conduction studies.  You may need to see a specialist.  TREATMENT  Finding treatment that works well may take time. You may be referred to a pain specialist. He or she may prescribe medicine or therapies, such as:   Mindful meditation or yoga.  Shots (injections) of numbing or pain-relieving medicines into the spine or area of pain.  Local electrical stimulation.  Acupuncture.   Massage therapy.   Aroma, color, light, or sound therapy.   Biofeedback.   Working with a physical therapist to keep from getting stiff.   Regular, gentle exercise.   Cognitive or behavioral therapy.   Group  support.  Sometimes, surgery may be recommended.  HOME CARE INSTRUCTIONS   Take all medicines as directed by your health care provider.   Lessen stress in your life by relaxing and doing things such as listening to calming music.   Exercise or be active as directed by your health care provider.   Eat a healthy diet and include things such as vegetables, fruits, fish, and lean meats in your diet.   Keep all follow-up appointments with your health care provider.   Attend a support group with others suffering from chronic pain. SEEK MEDICAL CARE IF:   Your pain gets worse.   You develop a new pain that was not there before.   You cannot tolerate medicines given to you by your health care provider.   You have new symptoms since your last visit with your health care provider.  SEEK IMMEDIATE MEDICAL CARE IF:   You feel weak.   You have decreased sensation or numbness.   You lose control of bowel or bladder function.   Your pain suddenly gets much worse.   You develop shaking.  You develop chills.  You develop confusion.  You develop chest pain.  You develop shortness of breath.  MAKE SURE YOU:  Understand these instructions.  Will watch your condition.  Will get help right away if you are not doing well or get worse.   This information is not intended to replace advice given to you by your health care provider. Make sure you discuss any questions you have with your health care provider.   Document Released: 07/19/2002 Document Revised: 06/29/2013 Document Reviewed: 04/22/2013 Elsevier Interactive Patient Education 2016 Elsevier Inc.  Sciatica Sciatica is pain, weakness, numbness, or tingling along the path of the sciatic nerve. The nerve starts in the lower back and runs down the back of each leg. The nerve controls the muscles in the lower leg and in the back of the knee, while also providing sensation to the back of the thigh, lower leg, and the  sole of your foot. Sciatica is a symptom of another medical condition. For instance, nerve damage or certain conditions, such as a herniated disk or bone spur on the spine, pinch or put pressure on the sciatic nerve. This causes the pain, weakness, or other sensations normally associated with sciatica. Generally, sciatica only affects one side of the body. CAUSES   Herniated or slipped disc.  Degenerative disk disease.  A pain disorder involving the narrow muscle in the buttocks (piriformis syndrome).  Pelvic injury or fracture.  Pregnancy.  Tumor (rare). SYMPTOMS  Symptoms can vary from mild to very severe. The symptoms usually travel from the low back to the buttocks and down the back of the leg. Symptoms can include:  Mild tingling or dull aches in the lower back, leg, or hip.  Numbness in the back of the calf or sole of the foot.  Burning sensations in the lower back, leg, or hip.  Sharp pains in the lower back, leg, or hip.  Leg weakness.  Severe back pain inhibiting movement. These symptoms may get worse with coughing, sneezing, laughing, or prolonged sitting or standing. Also, being overweight may worsen symptoms. DIAGNOSIS  Your caregiver will perform a physical exam to look for common symptoms of sciatica. He or she may ask you to do certain movements or activities that would trigger sciatic nerve pain. Other tests may be performed to find the cause of the sciatica. These may include:  Blood tests.  X-rays.  Imaging tests, such as an MRI or CT scan. TREATMENT  Treatment is directed at the cause of the sciatic pain. Sometimes, treatment is not necessary and the pain and discomfort goes away on its own. If treatment is needed, your caregiver may suggest:  Over-the-counter medicines to relieve pain.  Prescription medicines, such as anti-inflammatory medicine, muscle relaxants, or narcotics.  Applying heat or ice to the painful area.  Steroid injections to lessen  pain, irritation, and inflammation around the nerve.  Reducing activity during periods of pain.  Exercising and stretching to strengthen your abdomen and improve flexibility of your spine. Your caregiver may suggest losing weight if the extra weight makes the back pain worse.  Physical therapy.  Surgery to eliminate what is pressing or pinching the nerve, such as a bone spur or part of a herniated disk. HOME CARE INSTRUCTIONS   Only take over-the-counter or prescription medicines for pain or discomfort as directed by your caregiver.  Apply ice to the affected area for 20 minutes, 3-4 times a day for the first 48-72 hours. Then try heat in the same way.  Exercise, stretch, or perform your usual activities if these do not aggravate your pain.  Attend physical therapy sessions as directed by your caregiver.  Keep all follow-up appointments as directed by your caregiver.  Do not wear high heels or shoes that do not provide proper support.  Check your mattress to see if it is too soft. A firm mattress may lessen your pain and discomfort. SEEK IMMEDIATE MEDICAL CARE IF:   You lose control of your bowel or bladder (incontinence).  You have increasing weakness in the lower back, pelvis, buttocks, or legs.  You have redness or swelling of your back.  You have a burning sensation when you urinate.  You have pain that gets worse when you lie down or awakens you at night.  Your pain is worse than you have experienced in the past.  Your pain is lasting longer than 4 weeks.  You are suddenly losing weight without reason. MAKE SURE YOU:  Understand these instructions.  Will watch your condition.  Will get help right away if you are not doing well or get worse.   This information is not intended to replace advice given to you by your health care provider. Make sure you discuss any questions you have with your health care provider.   Document Released: 10/21/2001 Document Revised:  07/18/2015 Document Reviewed: 03/07/2012 Elsevier Interactive Patient Education [employer].

## 2016-02-20 NOTE — ED Notes (Signed)
Patient started having lower back muscle spasms 6 days ago and sharp back pain 4 days ago. Patient has a history of back surgery L4 L5

## 2016-02-20 NOTE — ED Provider Notes (Signed)
[facility]  ____________________________________________  Time seen: Approximately 3:40 PM  I have reviewed the triage vital signs and the nursing notes.   HISTORY  Chief Complaint Back Pain  HPI  Kyle [name] is a 40 y.o. male presents with a complaint of left lower back pain that has been present 4 days. Patient reports that he is a chronic history of low back pain including a L4-L5 discectomy. Patient reports that he has intermittent chronic sciatica. States usually sciatica is on the right lower back. Patient reports that 4-5 days ago started having some muscle spasms that are described as cramping pain in his left lower back that has improved now with left lower back pain intermittently radiates down the left posterior thigh. Denies any pain radiation past mid posterior thigh. Denies any urinary or bowel retention or incontinence. Denies any numbness or tingling sensation. Reports pain is primarily with movement but also has some pain at rest. Patient reports that this is similar to his chronic flares up flareups of his pain. Reports last flareup was in January of this year.  She reports that he does chronically take gabapentin but states that he has been out of his gabapentin for the last few weeks as he has just not been able to get back in with his primary doctor. Patient reports that his primary doctor is in [place] and he just recently moved back to the [place] area and has not yet established a another primary care physician. Patient denies any fall, trauma or known trigger. Patient reports that this is also consistent with his normal flareups.  Denies numbness, tingling, urinary or bowel retention or incontinence, difficulty ambulating, fall, trauma, chest pain, shortness of breath, upper back or neck pain, abdominal pain, dysuria, rash, extremity pain or weakness.Denies recent sickness.    Past Medical History  Diagnosis Date  . Asthma   . DDD (degenerative disc  disease), cervical   . Spondylosis   . Ankylosing spondylitis [facility](HCC)     Patient Active Problem List   Diagnosis Date Noted  . Osteomyelitis of lumbar spine (HCC) 09/09/2014  . Mediastinitis 09/09/2014  . MRSA bacteremia 08/17/2014  . Chest pain 08/16/2014  . Dyspnea 08/16/2014  . Heroin abuse 08/16/2014  . Amphetamine abuse 08/16/2014  . Symptomatic premature ventricular contractions 08/16/2014  . Chronic back pain 08/16/2014  . Orthostatic hypotension 08/16/2014  . Abnormal chest CT 08/16/2014    Past Surgical History  Procedure Laterality Date  . Spine surgery    . Tonsillectomy      Current Outpatient Rx  Name  Route  Sig  Dispense  Refill  . albuterol (PROVENTIL HFA;VENTOLIN HFA) 108 (90 BASE) MCG/ACT inhaler   Inhalation   Inhale 2 puffs into the lungs every 6 (six) hours as needed for wheezing or shortness of breath (wheezing & shortness of breath).          . gabapentin (NEURONTIN) 300 MG capsule   Oral   Take 300 mg by mouth 3 (three) times daily.         .           .           .           .           . rifampin (RIFADIN) 300 MG capsule   Oral   Take 2 capsules (600 mg total) by mouth daily. For eight weeks.   60 capsule   1   .  Allergies Penicillins; Banana; Carrot; Celery oil; and Other  History reviewed. No pertinent family history.  Social History Social History  Substance Use Topics  . Smoking status: Former Smoker -- 0.50 packs/day    Types: Cigarettes  . Smokeless tobacco: Never Used  . Alcohol Use: No    Review of Systems Constitutional: No fever/chills Eyes: No visual changes. ENT: No sore throat. Cardiovascular: Denies chest pain. Respiratory: Denies shortness of breath. Gastrointestinal: No abdominal pain.  No nausea, no vomiting.  No diarrhea.  No constipation. Genitourinary: Negative for dysuria. Musculoskeletal: positive for back pain. Skin: Negative for rash. Neurological: Negative for headaches, focal  weakness or numbness.  10-point ROS otherwise negative.  ____________________________________________   PHYSICAL EXAM:  VITAL SIGNS: ED Triage Vitals  Enc Vitals Group     BP 02/20/16 1505 103/56 mmHg     Pulse Rate 02/20/16 1505 104     Resp 02/20/16 1505 18     Temp 02/20/16 1505 98 F (36.7 C)     Temp src --      SpO2 02/20/16 1505 96 %     Weight 02/20/16 1505 160 lb (72.576 kg)     Height 02/20/16 1505  (1.702 m)     Head Cir --      Peak Flow --      Pain Score 02/20/16 1512 9     Pain Loc --      Pain Edu? --      Excl. in GC? --     Constitutional: Alert and oriented. Well appearing and in no acute distress. Eyes: Conjunctivae are normal. PERRL. EOMI. Head: Atraumatic.   Nose: No congestion/rhinnorhea.  Mouth/Throat: Mucous membranes are moist.  Oropharynx non-erythematous. Neck: No stridor.  No cervical spine tenderness to palpation. Hematological/Lymphatic/Immunilogical: No cervical lymphadenopathy. Cardiovascular: Normal rate, regular rhythm. Grossly normal heart sounds.  Good peripheral circulation. Respiratory: Normal respiratory effort.  No retractions. Lungs CTAB. Gastrointestinal: Soft and nontender. Normal bowel sounds.  Musculoskeletal: No lower or upper extremity tenderness nor edema.  No joint effusions. Bilateral pedal pulses equal and easily palpated.No midline cervical, thoracic or lumbar tenderness to palpation. 2+ bilateral patellar reflexes and Achilles reflexes.  5 out of 5 strength to bilateral upper and lower extremities. Left lower back at greater sciatic notch moderate tenderness to direct palpation, no saddle anesthesia, mild pain with left straight leg test, bilateral plantar flexion and dorsiflexion strong and equal, no ecchymosis, no erythema, no swelling. Changes suspicious and from lying to sitting to standing quickly without distress. Ambulatory in room steady gait.  Neurologic:  Normal speech and language. No gross focal neurologic  deficits are appreciated. No gait instability. Skin:  Skin is warm, dry and intact. No rash noted. Psychiatric: Mood and affect are normal. Speech and behavior are normal.  ____________________________________________   LABS (all labs ordered are listed, but only abnormal results are displayed)  Labs Reviewed - No data to display   INITIAL IMPRESSION / ASSESSMENT AND PLAN / ED COURSE  Pertinent labs & imaging results that were available during my care of the patient were reviewed by me and considered in my medical decision making (see chart for details).  Very well-appearing patient. No acute distress. Presents with complaints of 4-5 days of left lower back pain. No midline tenderness. Left greater sciatic area moderate tenderness to palpation with pain radiation to the posterior thigh. Neurovascular intact. 5 out of 5 strength to bilateral upper and lower extremities. Suspect left-sided sciatica. As no midline tenderness  and nontraumatic do not recommend x-ray at this time. Will refill patient gabapentin prescription per his request. We'll also treat with Robaxin, prednisone taper and when necessary oxycodone for breakthrough pain. With chronic controlled substance database utilized and according to [place] chronic controlled substance database last narcotic prescription was oxycodone received 12/03/2015. No focal neurological deficits. Encouraged patient to rest ice and home stretching. Information for local primary care physician as well as orthopedic given. Encouraged patient have close follow-up.  Discussed follow up with Primary care physician this week. Discussed follow up and return parameters including no resolution or any worsening concerns. Patient verbalized understanding and agreed to plan.   ____________________________________________   FINAL CLINICAL IMPRESSION(S) / ED DIAGNOSES  Final diagnoses:  Sciatica, left  Chronic back pain      Note: This dictation was prepared  with Dragon dictation along with smaller phrase technology. Any transcriptional errors that result from this process are unintentional.    Renford Dills, NP 02/20/16 1802

## 2016-03-19 ENCOUNTER — Ambulatory Visit
Admission: RE | Admit: 2016-03-19 | Discharge: 2016-03-19 | Disposition: A | Discharge status: Home / Self Care | Payer: Disability Insurance | Source: Ambulatory Visit | Attending: General Practice | Admitting: General Practice

## 2016-03-19 ENCOUNTER — Other Ambulatory Visit: Payer: Self-pay | Admitting: General Practice

## 2016-03-19 DIAGNOSIS — M5136 Other intervertebral disc degeneration, lumbar region: Secondary | ICD-10-CM | POA: Insufficient documentation

## 2016-03-19 DIAGNOSIS — M48061 Spinal stenosis, lumbar region without neurogenic claudication: Secondary | ICD-10-CM

## 2016-03-19 DIAGNOSIS — M4806 Spinal stenosis, lumbar region: Secondary | ICD-10-CM | POA: Insufficient documentation

## 2016-04-29 IMAGING — MR MR LUMBAR SPINE WO/W CM
4 of 7 series · 19 of 48 positions shown · IV contrast (multihance)
Comparison: Lumbar MRI 05/16/2008.  Lumbar radiographs 06/29/2012

CLINICAL DATA: Low back pain.  Back surgery 8111

EXAM:
MRI LUMBAR SPINE WITHOUT AND WITH CONTRAST
TECHNIQUE: Multiplanar and multiecho pulse sequences of the lumbar spine were
obtained without and with intravenous contrast.
CONTRAST:  14mL MULTIHANCE GADOBENATE DIMEGLUMINE 529 MG/ML IV SOLN

[Series 3: T1 · sagittal · 4.0mm · 0.55mm/px · 3 of 14 slices shown (1 of 2)]
[im 1/14]
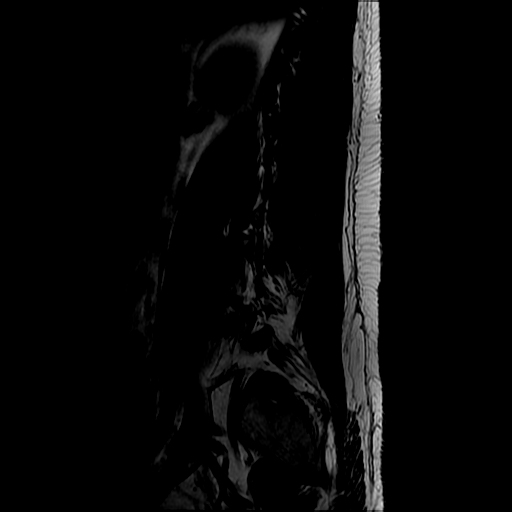
[im 7/14]
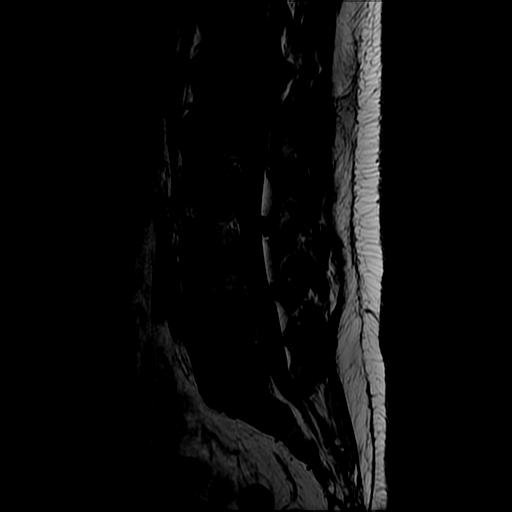
[im 14/14]
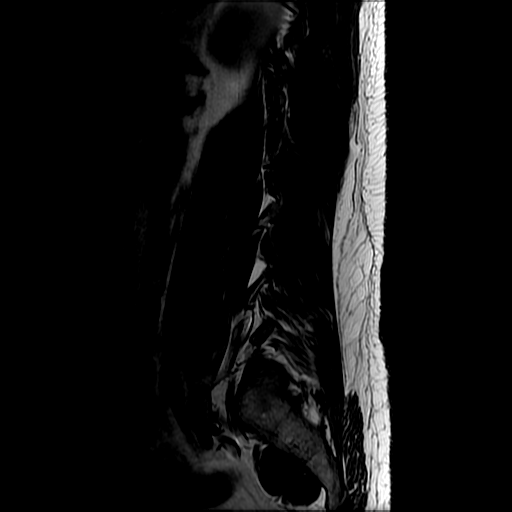

[Series 7: T1 · axial · 4.0mm · 0.39mm/px · z∈[-38,+144]mm · 3 of 43 slices shown (2 of 2)]
[im 5/43]
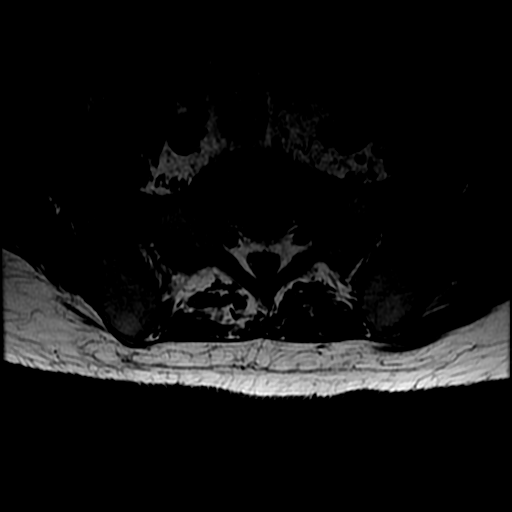
[im 22/43]
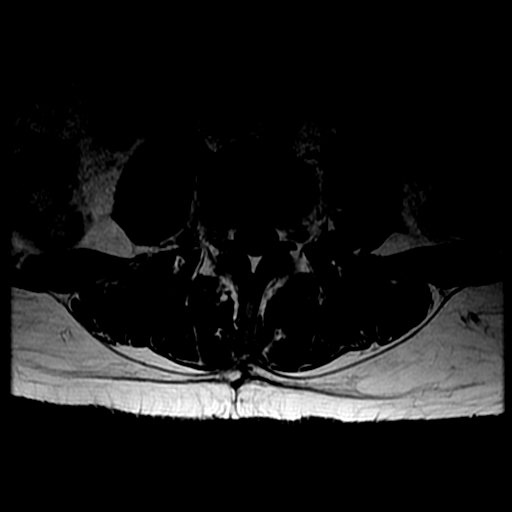
[im 38/43]
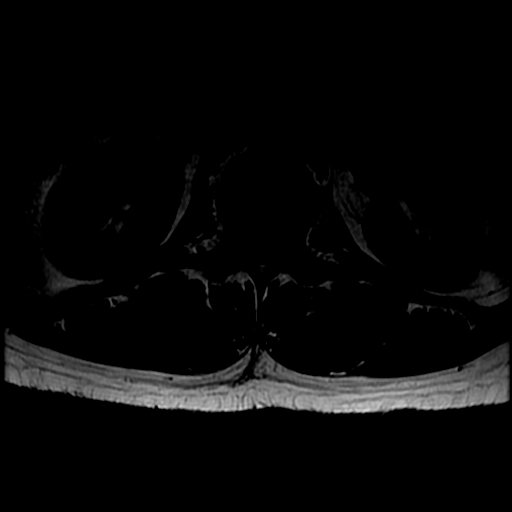

[Series 8: T2 · axial · 4.0mm · 0.39mm/px · z∈[-57,+144]mm · 10 of 43 slices shown]
[im 1/43]
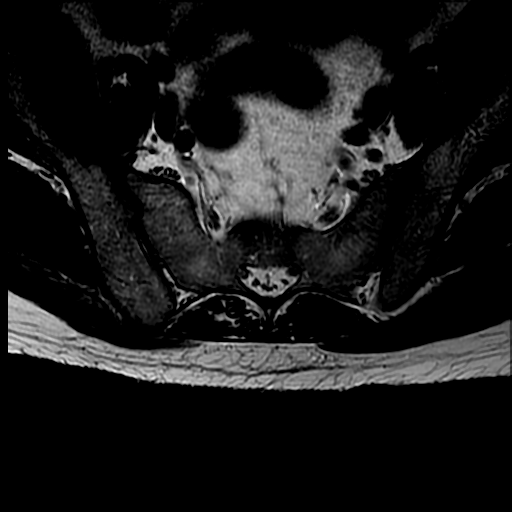
[im 5/43]
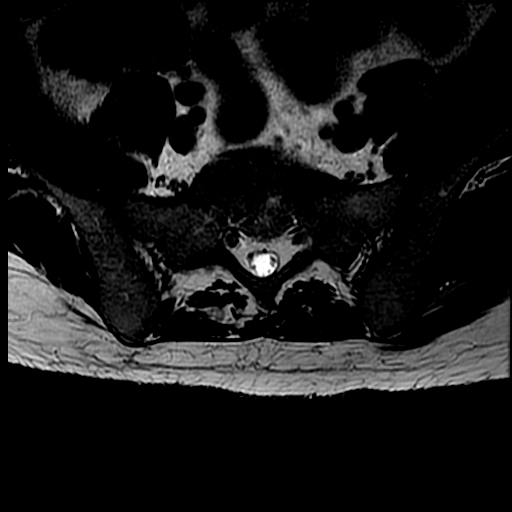
[im 9/43]
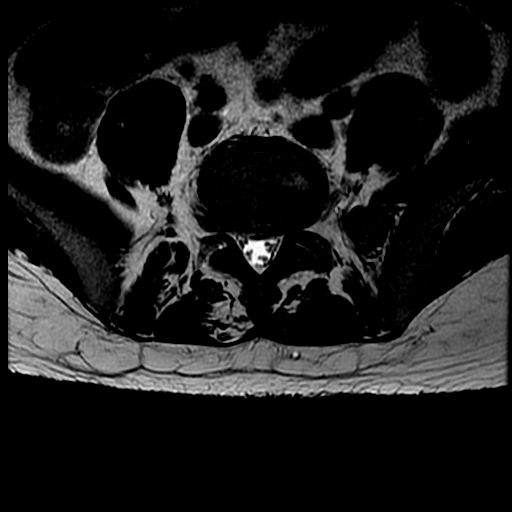
[im 13/43]
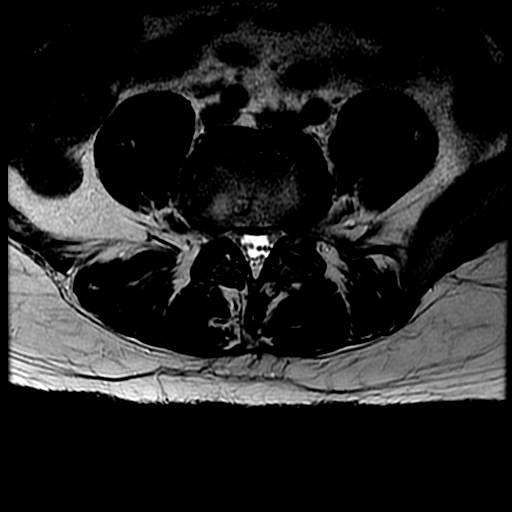
[im 17/43]
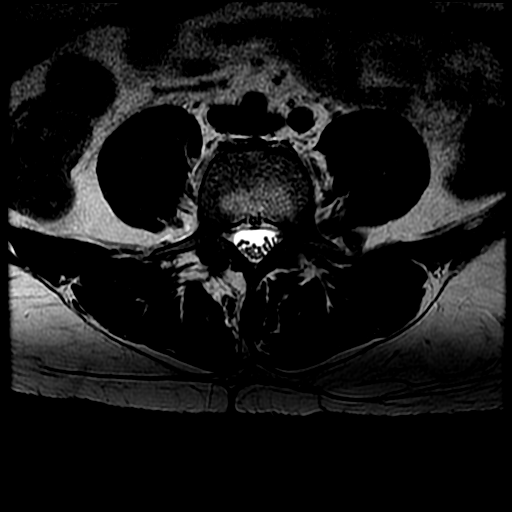
[im 22/43]
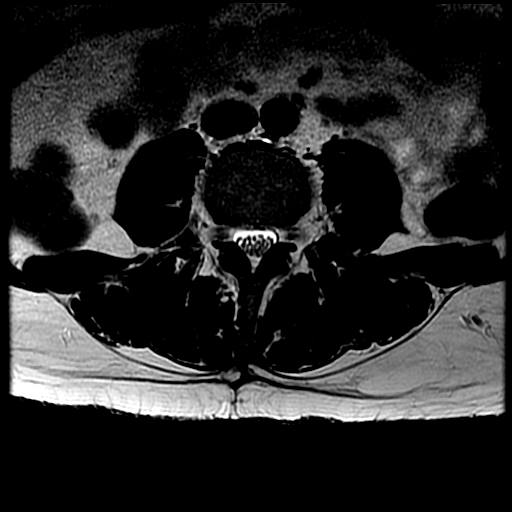
[im 26/43]
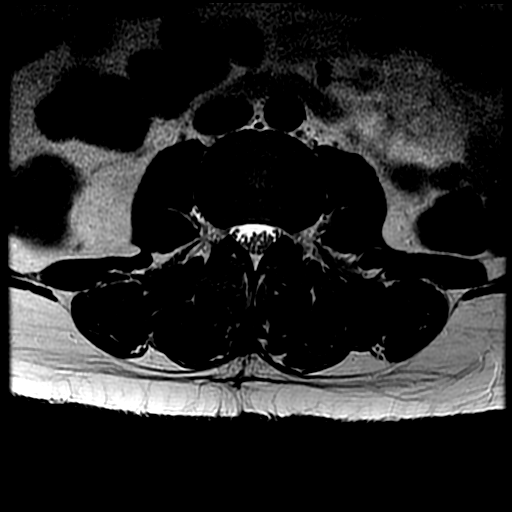
[im 30/43]
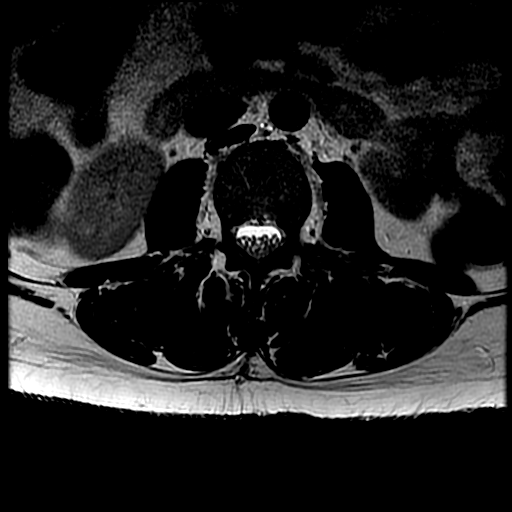
[im 34/43]
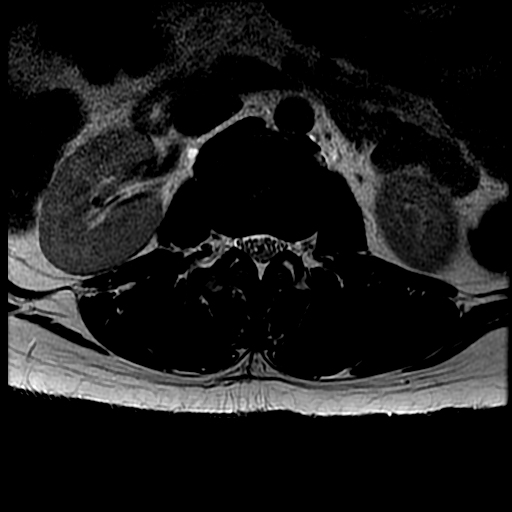
[im 38/43]
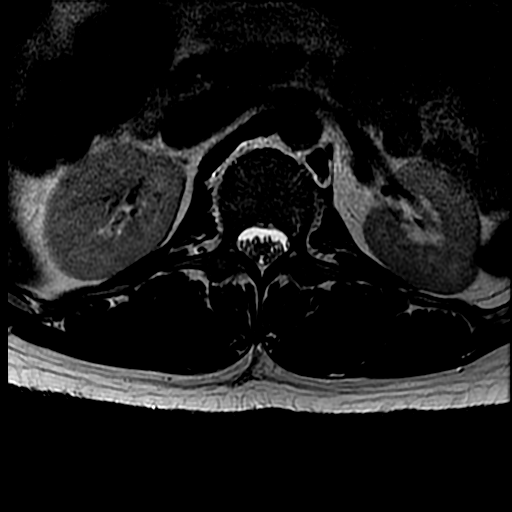

[Series 11: T2 post-contrast · sagittal · 4.0mm · 0.55mm/px · 3 of 15 slices shown]
[im 1/15]
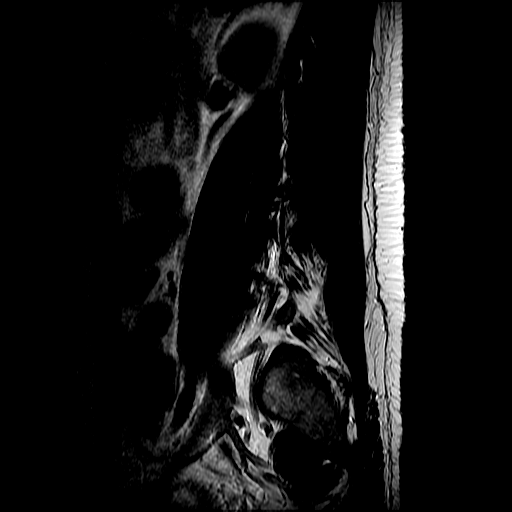
[im 10/15]
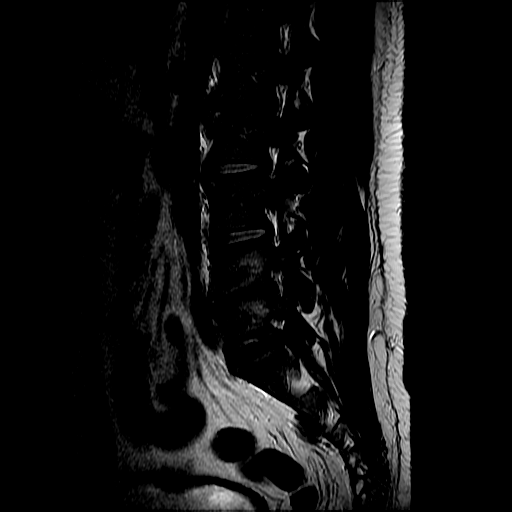
[im 15/15]
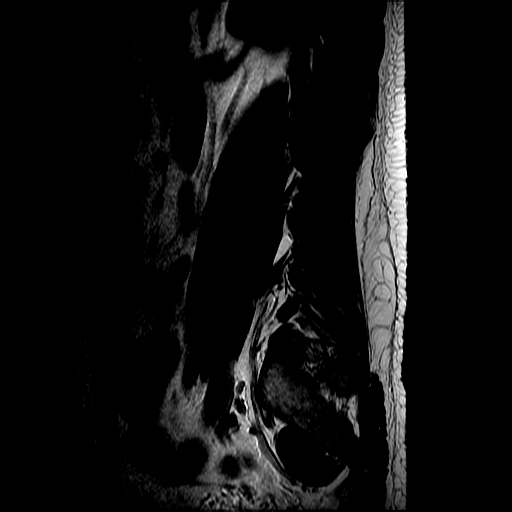

[19 of 48 positions shown; findings below may reference images not displayed]

FINDINGS: Normal lumbar alignment. Negative for fracture or mass lesion.
Negative for fluid collection. No enhancing lesions are seen
postcontrast. Conus medullaris is normal and terminates at L1-2

T12-L1: Disc degeneration with Schmorl's node. Small central disc
protrusion without stenosis

L1-2: Moderate disc degeneration with disc space narrowing and
spondylosis. Mild spinal stenosis

L2-3:  Mild degenerative change

L3-4:  Mild degenerative change

L4-5: Prior laminotomy on the right. Advanced degenerative change in
the disc space with disc space narrowing and endplate sclerosis and
enhancement. Endplate osteophyte formation causing mild foraminal
narrowing bilaterally and mild spinal stenosis.

L5-S1: Small disc protrusion on the left with mild displacement of
the left S1 nerve root. Disc degeneration with spurring and mild
foraminal narrowing bilaterally. Mild facet hypertrophy.
IMPRESSION: Postop changes at L4-5 with advanced disc degeneration and
spondylosis.

Small left-sided disc protrusion L5-S1 with displacement of the left
S1 nerve root.

## 2018-09-06 LAB — GENERIC EXTERNAL RESULT (UNMAPPED): HIV-1,2 AB/HIV-1 P24 AG SCREEN: NEGATIVE — NL

## 2019-06-02 LAB — GENERIC EXTERNAL RESULT (UNMAPPED): HIV 1,2 Ag/Ab Combo: NEGATIVE — NL

## 2019-06-09 LAB — HIV 1/2 ANTIBODY/ANTIGEN SCREEN - EXTERNAL: HIV 1/2 Ab and P24 Ag: NEGATIVE — NL

## 2019-06-20 ENCOUNTER — Emergency Department (HOSPITAL_COMMUNITY): Payer: Medicaid Other

## 2019-06-20 ENCOUNTER — Emergency Department (HOSPITAL_BASED_OUTPATIENT_CLINIC_OR_DEPARTMENT_OTHER): Payer: Medicaid Other

## 2019-06-20 ENCOUNTER — Inpatient Hospital Stay
Admission: EM | Admit: 2019-06-20 | Discharge: 2019-06-23 | DRG: 349 | Disposition: A | Discharge status: Home / Self Care | Payer: Medicaid Other | Attending: Internal Medicine | Admitting: Internal Medicine

## 2019-06-20 DIAGNOSIS — F840 Autistic disorder: Secondary | ICD-10-CM | POA: Diagnosis present

## 2019-06-20 DIAGNOSIS — Q796 Ehlers-Danlos syndrome, unspecified: Secondary | ICD-10-CM

## 2019-06-20 DIAGNOSIS — L7634 Postprocedural seroma of skin and subcutaneous tissue following other procedure: Secondary | ICD-10-CM | POA: Diagnosis present

## 2019-06-20 DIAGNOSIS — T148XXA Other injury of unspecified body region, initial encounter: Secondary | ICD-10-CM | POA: Diagnosis present

## 2019-06-20 DIAGNOSIS — A4902 Methicillin resistant Staphylococcus aureus infection, unspecified site: Secondary | ICD-10-CM

## 2019-06-20 DIAGNOSIS — T368X6A Underdosing of other systemic antibiotics, initial encounter: Secondary | ICD-10-CM | POA: Diagnosis present

## 2019-06-20 DIAGNOSIS — Z881 Allergy status to other antibiotic agents status: Secondary | ICD-10-CM

## 2019-06-20 DIAGNOSIS — B9562 Methicillin resistant Staphylococcus aureus infection as the cause of diseases classified elsewhere: Secondary | ICD-10-CM | POA: Diagnosis present

## 2019-06-20 DIAGNOSIS — F419 Anxiety disorder, unspecified: Secondary | ICD-10-CM

## 2019-06-20 DIAGNOSIS — Z043 Encounter for examination and observation following other accident: Secondary | ICD-10-CM

## 2019-06-20 DIAGNOSIS — Z8249 Family history of ischemic heart disease and other diseases of the circulatory system: Secondary | ICD-10-CM

## 2019-06-20 DIAGNOSIS — Z981 Arthrodesis status: Secondary | ICD-10-CM

## 2019-06-20 DIAGNOSIS — R21 Rash and other nonspecific skin eruption: Secondary | ICD-10-CM | POA: Diagnosis present

## 2019-06-20 DIAGNOSIS — Y831 Surgical operation with implant of artificial internal device as the cause of abnormal reaction of the patient, or of later complication, without mention of misadventure at the time of the procedure: Secondary | ICD-10-CM | POA: Diagnosis present

## 2019-06-20 DIAGNOSIS — T84428A Displacement of other internal orthopedic devices, implants and grafts, initial encounter: Secondary | ICD-10-CM | POA: Diagnosis present

## 2019-06-20 DIAGNOSIS — L089 Local infection of the skin and subcutaneous tissue, unspecified: Secondary | ICD-10-CM

## 2019-06-20 DIAGNOSIS — Z720 Tobacco use: Secondary | ICD-10-CM

## 2019-06-20 DIAGNOSIS — Z9889 Other specified postprocedural states: Secondary | ICD-10-CM

## 2019-06-20 DIAGNOSIS — Z590 Homelessness: Secondary | ICD-10-CM

## 2019-06-20 DIAGNOSIS — T8131XA Disruption of external operation (surgical) wound, not elsewhere classified, initial encounter: Secondary | ICD-10-CM | POA: Diagnosis present

## 2019-06-20 DIAGNOSIS — Z8614 Personal history of Methicillin resistant Staphylococcus aureus infection: Secondary | ICD-10-CM

## 2019-06-20 DIAGNOSIS — Z91048 Other nonmedicinal substance allergy status: Secondary | ICD-10-CM

## 2019-06-20 DIAGNOSIS — M546 Pain in thoracic spine: Secondary | ICD-10-CM

## 2019-06-20 DIAGNOSIS — T847XXA Infection and inflammatory reaction due to other internal orthopedic prosthetic devices, implants and grafts, initial encounter: Principal | ICD-10-CM | POA: Diagnosis present

## 2019-06-20 DIAGNOSIS — M459 Ankylosing spondylitis of unspecified sites in spine: Secondary | ICD-10-CM | POA: Diagnosis present

## 2019-06-20 DIAGNOSIS — Z91128 Patient's intentional underdosing of medication regimen for other reason: Secondary | ICD-10-CM

## 2019-06-20 DIAGNOSIS — Z833 Family history of diabetes mellitus: Secondary | ICD-10-CM

## 2019-06-20 DIAGNOSIS — B182 Chronic viral hepatitis C: Secondary | ICD-10-CM | POA: Diagnosis present

## 2019-06-20 DIAGNOSIS — M455 Ankylosing spondylitis of thoracolumbar region: Secondary | ICD-10-CM | POA: Diagnosis present

## 2019-06-20 LAB — CBC WITH DIFF, BLOOD
ANC-Automated: 2.8 10*3/uL (ref 1.6–7.0)
Abs Basophils: 0 10*3/uL (ref <0.1)
Abs Eosinophils: 0.3 10*3/uL (ref 0.1–0.5)
Abs Lymphs: 1.8 10*3/uL (ref 0.8–3.1)
Abs Monos: 0.9 10*3/uL — ABNORMAL HIGH (ref 0.2–0.8)
Basophils: 0 %
Eosinophils: 5 %
Hct: 41.5 % (ref 40.0–50.0)
Hgb: 13.1 gm/dL — ABNORMAL LOW (ref 13.7–17.5)
Imm Gran %: 1 % (ref <1)
Imm Gran Abs: 0.1 10*3/uL (ref <0.1)
Lymphocytes: 30 %
MCH: 28.9 pg (ref 26.0–32.0)
MCHC: 31.6 g/dL — ABNORMAL LOW (ref 32.0–36.0)
MCV: 91.6 um3 (ref 79.0–95.0)
MPV: 9.2 fL — ABNORMAL LOW (ref 9.4–12.4)
Monocytes: 15 %
Plt Count: 249 10*3/uL (ref 140–370)
RBC: 4.53 10*6/uL — ABNORMAL LOW (ref 4.60–6.10)
RDW: 15.3 % — ABNORMAL HIGH (ref 12.0–14.0)
Segs: 48 %
WBC: 5.8 10*3/uL (ref 4.0–10.0)

## 2019-06-20 LAB — LACTATE, BLOOD: Lactate: 2 mmol/L (ref 0.5–2.0)

## 2019-06-20 LAB — COMPREHENSIVE METABOLIC PANEL, BLOOD
ALT (SGPT): 109 U/L — ABNORMAL HIGH (ref 0–41)
AST (SGOT): 93 U/L — ABNORMAL HIGH (ref 0–40)
Albumin: 3.9 g/dL (ref 3.5–5.2)
Alkaline Phos: 153 U/L — ABNORMAL HIGH (ref 40–129)
Anion Gap: 16 mmol/L — ABNORMAL HIGH (ref 7–15)
BUN: 20 mg/dL (ref 6–20)
Bicarbonate: 23 mmol/L (ref 22–29)
Bilirubin, Tot: 0.6 mg/dL (ref <1.2)
Calcium: 9.4 mg/dL (ref 8.5–10.6)
Chloride: 99 mmol/L (ref 98–107)
Creatinine: 1 mg/dL (ref 0.67–1.17)
GFR: >60 mL/min
Glucose: 106 mg/dL — ABNORMAL HIGH (ref 70–99)
Potassium: 3.8 mmol/L (ref 3.5–5.1)
Sodium: 138 mmol/L (ref 136–145)
Total Protein: 7.8 g/dL (ref 6.0–8.0)

## 2019-06-20 LAB — CPK-CREATINE PHOSPHOKINASE, BLOOD: CPK: 117 U/L (ref 0–175)

## 2019-06-20 LAB — SED RATE, BLOOD: Sed Rate: 51 mm/hr — ABNORMAL HIGH (ref 0–15)

## 2019-06-20 LAB — C-REACTIVE PROTEIN, BLOOD: CRP: 0.21 mg/dL (ref <0.5)

## 2019-06-20 MED ORDER — MORPHINE SULFATE 2 MG/ML IJ SOLN
6.0000 mg | Freq: Once | INTRAMUSCULAR | Status: AC
Start: 2019-06-20 — End: 2019-06-20
  Administered 2019-06-20: 06:00:00 6 mg via INTRAVENOUS
  Filled 2019-06-20: qty 3

## 2019-06-20 MED ORDER — ONDANSETRON HCL 4 MG/2ML IV SOLN
4.00 mg | INTRAMUSCULAR | Status: DC
Start: ? — End: 2019-06-20

## 2019-06-20 MED ORDER — IOHEXOL 350 MG/ML IV SOLN
100.0000 mL | Freq: Once | INTRAVENOUS | Status: AC
Start: 2019-06-20 — End: 2019-06-20
  Administered 2019-06-20: 100 mL via INTRAVENOUS
  Filled 2019-06-20: qty 100

## 2019-06-20 MED ORDER — DIPHENHYDRAMINE HCL 50 MG/ML IJ SOLN
50.0000 mg | Freq: Once | INTRAMUSCULAR | Status: AC
Start: 2019-06-20 — End: 2019-06-20
  Administered 2019-06-20: 23:00:00 50 mg via INTRAVENOUS

## 2019-06-20 MED ORDER — LORAZEPAM 1 MG OR TABS
2.00 mg | ORAL_TABLET | ORAL | Status: DC
Start: ? — End: 2019-06-20

## 2019-06-20 MED ORDER — FAMOTIDINE 20 MG OR TABS
20.00 mg | ORAL_TABLET | ORAL | Status: DC
Start: 2019-06-18 — End: 2019-06-20

## 2019-06-20 MED ORDER — OXYCODONE HCL 5 MG OR TABS
5.00 mg | ORAL_TABLET | ORAL | Status: DC
Start: ? — End: 2019-06-20

## 2019-06-20 MED ORDER — BIO-K PLUS STRONG PO CPDR
2.00 | DELAYED_RELEASE_CAPSULE | ORAL | Status: DC
Start: 2019-06-18 — End: 2019-06-20

## 2019-06-20 MED ORDER — SODIUM CHLORIDE 0.9 % IV SOLN
600.0000 mg | Freq: Once | INTRAVENOUS | Status: AC
Start: 2019-06-20 — End: 2019-06-20
  Administered 2019-06-20: 600 mg via INTRAVENOUS
  Filled 2019-06-20: qty 600

## 2019-06-20 MED ORDER — ACETAMINOPHEN 325 MG PO TABS
975.0000 mg | ORAL_TABLET | Freq: Once | ORAL | Status: DC
Start: 2019-06-20 — End: 2019-06-20

## 2019-06-20 MED ORDER — HYDROMORPHONE HCL 1 MG/ML IJ SOLN
1.0000 mg | Freq: Once | INTRAMUSCULAR | Status: AC
Start: 2019-06-20 — End: 2019-06-20
  Administered 2019-06-20: 17:00:00 1 mg via INTRAVENOUS
  Filled 2019-06-20: qty 1

## 2019-06-20 MED ORDER — GABAPENTIN 300 MG OR CAPS
300.00 mg | ORAL_CAPSULE | ORAL | Status: DC
Start: 2019-06-18 — End: 2019-06-20

## 2019-06-20 MED ORDER — METHOCARBAMOL 750 MG OR TABS
750.00 mg | ORAL_TABLET | ORAL | Status: DC
Start: ? — End: 2019-06-20

## 2019-06-20 MED ORDER — ENOXAPARIN SODIUM 40 MG/0.4ML SC SOLN
40.00 mg | SUBCUTANEOUS | Status: DC
Start: 2019-06-19 — End: 2019-06-20

## 2019-06-20 MED ORDER — ALUM & MAG HYDROXIDE-SIMETH 200-200-20 MG/5ML OR SUSP
30.00 mL | ORAL | Status: DC
Start: ? — End: 2019-06-20

## 2019-06-20 MED ORDER — LORAZEPAM 2 MG/ML IJ SOLN
0.5000 mg | Freq: Once | INTRAMUSCULAR | Status: AC
Start: 2019-06-20 — End: 2019-06-20
  Administered 2019-06-20: 0.5 mg via INTRAVENOUS
  Filled 2019-06-20: qty 1

## 2019-06-20 MED ORDER — KETOROLAC TROMETHAMINE 15 MG/ML IJ SOLN
15.0000 mg | Freq: Once | INTRAMUSCULAR | Status: DC
Start: 2019-06-20 — End: 2019-06-20

## 2019-06-20 MED ORDER — PROCHLORPERAZINE MALEATE 10 MG OR TABS
10.00 mg | ORAL_TABLET | ORAL | Status: DC
Start: ? — End: 2019-06-20

## 2019-06-20 MED ORDER — MORPHINE SULFATE 4 MG/ML IJ SOLN
2.00 mg | INTRAMUSCULAR | Status: DC
Start: ? — End: 2019-06-20

## 2019-06-20 MED ORDER — NICOTINE 21 MG/24HR TD PT24
1.00 | MEDICATED_PATCH | TRANSDERMAL | Status: DC
Start: 2019-06-19 — End: 2019-06-20

## 2019-06-20 MED ORDER — MENTHOL 5.8 MG MT LOZG
1.00 | LOZENGE | OROMUCOSAL | Status: DC
Start: ? — End: 2019-06-20

## 2019-06-20 MED ORDER — DIPHENHYDRAMINE HCL 50 MG/ML IJ SOLN
INTRAMUSCULAR | Status: AC
Start: 2019-06-20 — End: 2019-06-20
  Administered 2019-06-20: 50 mg via INTRAVENOUS
  Filled 2019-06-20: qty 1

## 2019-06-20 MED ORDER — DAPTOMYCIN 500 MG IV SOLR
600.00 mg | INTRAVENOUS | Status: DC
Start: 2019-06-19 — End: 2019-06-20

## 2019-06-20 MED ORDER — MORPHINE SULFATE 4 MG/ML IJ SOLN
4.0000 mg | INTRAMUSCULAR | Status: DC | PRN
Start: 2019-06-20 — End: 2019-06-22
  Administered 2019-06-20 – 2019-06-22 (×10): 4 mg via INTRAVENOUS
  Filled 2019-06-20 (×11): qty 1

## 2019-06-20 NOTE — ED Notes (Signed)
Pt refusing BP measurements at this time. Pt yelling "get this thing off of me! This hurts and the cuff is assaulting me!" Pt educated on importance of blood pressure measurements. Pt verbalizes understanding but refusing all further blood pressures.

## 2019-06-20 NOTE — ED Notes (Signed)
IV ABX completed, noted redness, hives and rash noted to the IV site and up into the armpit.  No SOB noted, itching and burning noted.  MD Liotta in the middle of emergent CPR and ART line set up advised.  VO for Benadry 50 mg IVP

## 2019-06-20 NOTE — Consults (Cosign Needed)
ORTHOPAEDIC SURGERY CONSULT NOTE  06/21/19    Consulting Service: Medicine ([facility])    Orthopedic Injuries:   Possible SSI of spine    Date of Injury: 06/13/19    CC: leg numbness    HPI:  43 year old male with aPMH of ankylosing spondylitis status post T8-L1 posterior decompression and fusion with subsequent I+D on 8/3 for suspicious fluid collection, by neurosurgeon in [place], [place]. Gross purulence found on I+D, prompting ID rec for 8wks IV Dapto which the patient never started. Came to ED today for 3 days of acute onset, worsening BLE numbness and weakness. He has been able to ambulate without assistive devices.  Denies bowel or bladder incontinence, saddle anesthesia, fevers, chills, abnormal weight loss.  He just moved to [place] in his camper [name] and would like to establish care with new surgeon. Hx of multiple AMA discharges.    Review of Systems:  Constitutional: negative.  Eyes: negative.  Ears, Nose, Mouth, Throat: negative.  CV: negative.  Resp: negative.  GI: negative.  GU: negative.  Musculoskeletal: see HPI.  Integumentary: negative.  Neuro: negative.  Psych: negative.  Endo: negative.  Heme/Lymphatic: negative.    PMH:  No past medical history on file.     PSH:  No past surgical history on file.     Medications:  Current Facility-Administered Medications   Medication   . enoxaparin (LOVENOX) injection 40 mg   . morphine injection 4 mg   . nalOXone (NARCAN) injection 0.1 mg   . sodium chloride 0.9 % flush 3 mL   . sodium chloride 0.9 % flush 3 mL   . sodium chloride 0.9 % TKO infusion        Allergies:  Allergies   Allergen Reactions   . Daptomycin Hives   . Wound Dressing Adhesive Hives       Social History:  Occupation: Chief Strategy Officer  Activity/Mobility/Ambulatory: previously active  Living Situation: camper [name], travels from town to town on a whim  Alcohol: denies  Tobacco: Denies   Illicits: Denies     Family History:   Non-contributory     Physical Exam:  BP 92/61 (BP Location: Left arm, BP Patient  Position: Lying right side)   Pulse 96   Temp 98.1 F (36.7 C)   Resp 18   Ht 5' 7"  (1.702 m)   Wt 76.9 kg (169 lb 8.5 oz)   SpO2 98%   BMI 26.55 kg/m       General: no acute distress  Resp: no labored breathing  CV: RRR via peripheral pulses    Musculoskeletal:  RUE:      No pain with ROM of shoulder, elbow, wrist, hand  Grip  IO WF WE Bicep  Tricp  Delt    5/5  5/5  5/5 5/5 5/5  5/5  5/5     Sensory: SILT C5-T1   Vascular: Palpable radial pulse, cap refill < 2 seconds, WWP   Reflexes: 2+ biceps   Hoffman's: negative    LUE:     No pain with ROM of shoulder, elbow, wrist, hand  Grip  IO WF WE Bicep  Tricp  Delt    5/5  5/5  5/5 5/5 5/5  5/5  5/5     Sensory: SILT C5-T1   Vascular: Palpable radial pulse, cap refill < 2 seconds, WWP   Reflexes: 2+ biceps   Hoffman's: negative    RLE:   No low back with SLR  IP Quad  Hams  TA  EHL GSC  FHL   5/5 5/5  5/5  3/5  3/5  5/5 3/5     Sensory: diminished sensation L2-S1 but responds to noxious stimuli   Vascular: palpable PT/DP pulses, cap refill < 2 seconds, WWP   Reflexes: 2+ patellar, 1+ achilles   No clonus, Babinski with down-going toes    LLE:   No low back with SLR  IP Quad  Hams  TA   EHL GSC  FHL   5/5 5/5  5/5  1-3/5  1-3/5  5/5 1-3/5    Motor exam was variable secondary to variable effort.   Sensory:  Endorses loss of sensation L2-S1, worse than contralateral lower extremity.   Vascular: palpable PT/DP pulses, cap refill < 2 seconds, WWP   Reflexes: 2+ patellar, 1+ achilles   No clonus, Babinski with down-going toes    Spine:   Cervical: no TTP, no step-off   Thoracic: no TTP, no step-off   Lumbar: Incision looks well approximated with a running nylon stitch.  There is some maceration and dried serous discharge on the inferior aspect of incision.  There is a small unilateral area of edema to the left of this inferior part of the incision, but no erythema or appreciable fluctuance.  No expressible drainage.  Mildly tender to palpation.   Rectal: perianal  sensation intact, rectal exam deferred        Labs:  Lab Results   Component Value Date    NA 138 06/20/2019    K 3.8 06/20/2019    CL 99 06/20/2019    BICARB 23 06/20/2019    BUN 20 06/20/2019    CREAT 1.00 06/20/2019    GLU 106 (H) 06/20/2019    [place] 9.4 06/20/2019     Lab Results   Component Value Date    WBC 5.8 06/20/2019    HGB 13.1 (L) 06/20/2019    HCT 41.5 06/20/2019    PLT 249 06/20/2019     No results found for: INR, PTT  Lab Results   Component Value Date/Time    CRP 0.21 06/20/2019 05:29 AM     Lab Results   Component Value Date/Time    ESR 51 (H) 06/20/2019 05:29 AM       Imaging:  CT with contrast does not show any contrast enhancing collection.  Does show medial breech of the right T11 pedicle screw which is into the canal.    I personally reviewed other pertinent MSK imaging and failed to identify other acute traumatic MSK pathology.    Assessment:  Delrick Dehart is a 43 year old male who presents with new, acute onset bilateral lower extremity nonmyotomal weakness and nondermatomal sensation loss in the setting of recent spine I+D on 8/3 after which he refused recommended outpatient IV abx.  There is no contrast enhancing collection on CT.  MRI may further elucidate any phenomenon affecting the cord.  Area of peri-incisional swelling demonstrates no superficial gross evidence of infection.  Likely suture granuloma.    Plan/Recs:  - Treatment Plan:  No urgent indication for surgical intervention.  Final recommendations pending MRI  - Activity:  Activity as tolerated with assistance  - Immobilization:  Brace when out of bed.  - VTE ppx:  Per primary  - Antibiotics:  Per primary  - Pain control: multimodal pain control with PO meds, minimize IV   - Imaging:  MRI of the T and L spine pending  - Pre-Operative Workup: ensure CXR, ECG, CBC, BMP,  PTT/PT/INR, T&S and UA obtained  - Diet:  Okay to eat pending MRI.  If clinically deteriorates, make NPO and contact spine team    Dispo/Follow up plan:  Pending  MRI    Case discussed with Fellow, Dr. Beryle Flock. Attending of record is Dr. Lelon Huh    Arman Filter, MD  Orthopedic Surgery  PGY-2

## 2019-06-20 NOTE — ED Provider Notes (Signed)
Emergency Department Provider Note  The Date of Service for the Emergency Room encounter is 06/20/2019  4:27 AM   Patient: Brian Ritter, MRN [id number], DOB Nov 13, 1975  Primary MD: No Pcp, Per Patient  Chief Complaint   Patient presents with   . Back Pain     Pt had spine fusion about a week ago, has hardware in back, was with friend and getting up out of the car and felt like something may have shifted and pt then had a sharp pain that went down his L leg and has since has numbness to toes in L foot.          HPI: Brian Ritter is a 43 year old male with pmh significant for ankylosing spondylitis who presents to ED with cc of mid-back pain radiating down left leg x 1900 this evening.  He states he has had hardware and multiple other back surgeries in the last few months.  He reports he was shifting while getting out of [name] this evening when he felt something shift in his back.  He has had persistent pain since then.  Per chart review, pt is supposed to be on antibiotics until 9/14 but is not correctly on any.  He specifically denies fever, nausea, vomiting, diarrhea, cough, and SOB.      Review of Systems -- Pertinent positive ROS findings are provided in the above HPI, all other systems reviewed and are negative    Home Medications:  None       Allergies:Wound dressing adhesive    Past Medical & Surgical History:No past medical history on file.No past surgical history on file.    Family History:No family history on file.    Social History:  Social History     Tobacco Use   . Smoking status: Not on file   Substance Use Topics   . Alcohol use: Not on file   . Drug use: Not on file       Physical Exam -- Vital signs reviewed and noted below. Nursing notes reviewed.  Vitals:    06/20/19 0424 06/20/19 0700   BP: 127/82    Pulse: 106 90   Resp: 18 18   Temp: 98.1 F (36.7 C)    SpO2: 98% 100%   Weight: 76.9 kg (169 lb 8.5 oz)    Height: _0  (1.702 m)        Constitutional: Well developed, well nourished  HENT:  Normocephalic, atraumatic, no diaphoresis  Eyes: Conjunctiva unremarkable, no scleral icterus, no drainage  Neck: Ranging comfortably, no asymmetry  Resp: No distress, speaking in full clear sentences, clear to auscultation BL with no wheezes, rhonchi, or rales  CV: Regular rate, regular rhythm, no murmurs, rubs, or gallops, 2+ radial pulses BL, 2+ DP pulses BL  GI: Soft, non-tender, non-distended, positive bowel sounds  MSK: No edema, no gross deformities, surgical dressings on back, no significant erythema or drainage on back, midline spinal TTP over T-spine and L-spine  Skin: Warm, dry  Neuro: No facial asymmetry, moving all four extremities spontaneously  Psych: Mentation normal, behavior appropriate      Assessment, Medical Decision Making, & Plan  Pt is a 43 year old male with history as above who presents with new onset back pain after feeling shifting while turning to get out of a van, previous hx of multiple back surgeries with hardware and complications, vitals within NL limits except for mild tachycardia that resolved on exam, exam significant for no significant signs of  infection on back with TTP.  DDx includes hardware dislodgement, hardware infection, compression fracture, abscess, others.  Per medical records, pt is supposed to be on daptomycin therapy until 07/2019 but is not currently taking antibiotics since his most recent discharge 2 days ago  Will need to check inflammatory markers and possibly discuss with his care team how to best resume his therapy.  Will check T-spine XR and inflammatory markers, blood cultures.  Will treat pain with morphine.    Diagnostics:  CBC, CMP, CRP, ESR, blood cultures, T-spine XR  Treatments:  Analgesia      ED Course:   T-spine XR  IMPRESSION:  No complications seen at thoracolumbar instrumentation..    No significant or dangerous electrolyte abnormalities, nrml kidney fxn.     Alk phos, and liver transaminases mildly elevated on liver panel, normal  bilirubin.    CRP and lactate within NL limits.    No leukocytosis or leukopenia, no polycythemia or significant anemia, no thrombocytosis or thrombocytopenia.     Awaiting ESR.    Discussed pt with Dr. Suzie Portela who will continue care in ED.    Patient seen and discussed with ED attending, Dr. Flonnie Hailstone D. Charissa Bash, MD  Emergency Medicine, PGY2  [facility]       Clista Bernhardt, MD  Resident  06/20/19 [place], [place]  06/20/19 1909

## 2019-06-20 NOTE — ED MD Progress Note (Signed)
Xray back: RadPre: No acute osseous abnormality. No definite findings of hardware complication.

## 2019-06-20 NOTE — ED Notes (Addendum)
Patient becoming increasingly agitated and yelling in hallway. Patient upset about bed status not being ready/cleaned.   Unable to console patient. Charge RN notified and aware.   Charge RN to speak with patient.

## 2019-06-20 NOTE — ED MD Progress Note (Signed)
ED Handoff Note    Signout from Dr. Suzie Portela    Synopsis:   Multiple recent spinal surgeries  Hardware in back  P/w  L leg numbness and back pain x 1 day  Supposed to be on IV abx   Admitting for IV abx    Vitals:  Vitals:    06/20/19 0424 06/20/19 0700   BP: 127/82    Pulse: 106 90   Resp: 18 18   Temp: 98.1 F (36.7 C)    SpO2: 98% 100%   Weight: 76.9 kg (169 lb 8.5 oz)    Height: 5\' 7"  (1.702 m)        ED Course:  Workup Review as of Jun 19 1518   Others' Documentation   Mon Jun 20, 2019   0657 [address] [place]  43 hx of multiple recent spinal surgiers, has hardware  Supposed to be on Daptomycin but isn't  Back pain since last night after getting out of van  Has numbness in left lower leg  Able to ambulate, call his physician to see if he really needs to be on abx    [MC]      Workup Review User Index  [MC] Kandy Garrison, MD       MDM and disposition:   Admit    Pearline Cables, MD  PGY-4, Emergency Medicine

## 2019-06-20 NOTE — ED Notes (Signed)
Per previous RN, bed still dirty/being cleaned.   Floor report in/ report given.   Pending bed ready to be admitted.   Charge RN aware.

## 2019-06-20 NOTE — ED Notes (Signed)
The patient is becoming anxious, acting out and requesting medication fr anxiety and food or he will leave.

## 2019-06-20 NOTE — ED Floor Report (Signed)
ED to IP Handoff    Report created by Sol Blazing, RN at 9:48 PM 06/20/2019.     HANDOFF REPORT UPDATE/CHANGES (changes in patient status/care/events prior to transfer)  By who:  Time:   Additional information:                                                                                                                                                     Brian Ritter is a 43 year old male.    Brief Summary of ED Visit (to include focused assessment and neuro status):  The patient has come to the ER w c/o back and right groin pain after he twited to get out of his [name].  States he is feeling weakness in his legs, but is still ambulatory.  He patient can get very anxious with sudden onset or a lot of simulation going on around him.      RN shift assessment exceptions to WDL: none    Any significant events and interventions with responses:  none    Radiologic studies not completed: none  (None unless otherwise noted)    Chief Complaint   Patient presents with   . Back Pain     Pt had spine fusion about a week ago, has hardware in back, was with friend and getting up out of the car and felt like something may have shifted and pt then had a sharp pain that went down his L leg and has since has numbness to toes in L foot.        Admitted for: Back pain and IV ABX    Code Status:  Please refer to In-pt admitting doctors orders     Level of Care: medical surgical     Is patient septic? no If yes, complete below:    BC x 2 drawn? yes  If No explain:  na    Repeat lactate needed? no  If Yes, when is it due?  n    All initial antibiotics given?  yes  If No, explain:  na    Amount of IV fluids received 1000 ml    Is patient on Heparin? no If yes, complete below:     Time Heparin bolus was given: n    Additional drips patient is on: na    Cardiac rhythm: nsr    Oxygen Delivery: None    No past medical history on file.    No past surgical history on file.    Allergies: Wound dressing adhesive    ED Fall Risk:  No    Skin issues:  no    >> If yes, note areas of skin breakdown. See appropriate photos.      Ambulatory:  yes    Sitter needed: no    CSSRS Suicide Risk Level Screening in Triage:  No Sitter    Does this patient have a history of aggressive behavior and/or is this patient a green banner patient? no    Isolation Required: no     >> If yes , what type of isolation: na    Is patient in custody?  no    Is patient in restraints? no    Swallow Screen:      Swallow Screen Result:      Was the patient made NPO & a Speech Language Pathology Consult Ordered? no    Vitals:    06/20/19 0424 06/20/19 0700 06/20/19 1819   BP: 127/82  116/54   BP Location:   Right arm   BP Patient Position:   Lying left side   Pulse: 106 90 72   Resp: 18 18 15    Temp: 98.1 F (36.7 C)     SpO2: 98% 100% 99%   Weight: 76.9 kg (169 lb 8.5 oz)     Height: 5\' 7"  (1.702 m)              Lab Results   Component Value Date    WBC 5.8 06/20/2019    RBC 4.53 (L) 06/20/2019    HGB 13.1 (L) 06/20/2019    HCT 41.5 06/20/2019    MCV 91.6 06/20/2019    MCHC 31.6 (L) 06/20/2019    RDW 15.3 (H) 06/20/2019    PLT 249 06/20/2019    MPV 9.2 (L) 06/20/2019       Lab Results   Component Value Date    NA 138 06/20/2019    K 3.8 06/20/2019    CL 99 06/20/2019    BICARB 23 06/20/2019    BUN 20 06/20/2019    CREAT 1.00 06/20/2019    GLU 106 (H) 06/20/2019    [place] 9.4 06/20/2019       Lab Results   Component Value Date    LACTATE 2.0 06/20/2019       No results found for: CPK, CKMBH, TROPONIN    No results found for: PH, PCO2, O2CONTENT, IVHC3, IVBE, O2SAT, UNPH, UNPCO2, ARTPH, ARTPCO2, ARTO2CNT, IAHC3, IABE, ARTO2SAT, UNAPH, UNAPCO2    No results found for this visit on 06/20/19.      Patient Lines/Drains/Airways Status    Active PICC Line / CVC Line / PIV Line / Drain / Airway / Intraosseous Line / Epidural Line / ART Line / Line Type / Wound     Name: Placement date: Placement time: Site: Days:    Peripheral IV - 20 G Right Arm  06/20/19   0530   Arm  less than 1                     ED Handoff Report is ready for review.  Admitting RN may reach Emergency Department RN, Janice NorrieStephan W. Ezekial Arns, RN, at [phone] with any questions.

## 2019-06-20 NOTE — H&P (Signed)
Internal Medicine History and Physical    Date of admission: 06/20/19    Primary care physician: No Pcp, Per Patient  Inpatient attending: Denny Ritter, *  Room: 24/24      SUBJECTIVE     Chief complaint(s)/Reason(s) for admission  Bilateral leg numbness    History of present illness  Brian Ritter is a 44 year old male with hx of ankylosing spondylitis, Ehler's Danlos, autism, and recent spinal fusion surgery c/b postop seroma with MRSA presenting with 1 day history of bilateral leg numbness and weakness. He was driving with his partner in their [place] R[place] to [place] from [place] and suddenly developed bilateral leg numbness and weakness 1 day prior to presentation. Denies recent leg trauma, LOC, palpitations, CP, SOB. Reports that he can ambulate currently but needs to take "smaller and slower" steps.   Of note, he had trauma to his back around June 2020. Had recent surgery of Disc herniation s/p thoracic laminectomy, resection of herniated discs, fusion 7/13/20at OSH. Course c/b MRSA infectedSeroma postop thoracolumbar spine fusion s/p drainage and debridement 06/13/19. Cyto was daptomycin sensitive so started on daptomycin with plan of 6 weeks therapy until 9/14 and possible oral suppression at least 6 months. Patient left AMA 3 days prior. No PICC line was in place. Has not been taking daptomycin for 3 days.     ROS:   Gen- denies F/C, N/V  CP- denies CP, SOB, palpitations, cough, wheezing  Abd- denies abdominal pain, changes in BM  Neuro- as above    ER course summary:   Vitals- 98.68F, 106HR, 18RR, 127/82, 98% O2  Pain meds- dilaudid 1 mg IV, ativan 0.5 mg, IV morphine total 18 mg  XR T-spine and CT T-spine and L-spine done    Past medical history  As above  Possible hx Crohn's from [facility] records  Possible hx HCV  Prior hx bell's palsy    Past surgical history  As above  Knee surgery 20 years ago    Medications-  None     Allergies and adverse drug reactions  None    Social  history  Currently chews tobacco. Smoked for 10 years <1/2 ppd  No EtOH. Last used MJ 1-2 days prior    Family history  Hx DM and HTN in mother and father    OBJECTIVE       Physical exam  GENERAL: Resting in bed, NAD  HEENT: Pupils equal and reactive to light. Sclera anicteric and without injection. Mucous membranes moist. No lesions of the oral cavity or oropharynx.   CARDIOVASCULAR: Normal rate and regular rhythm. No murmurs, rubs, or gallops.    PULMONARY: Clear to auscultation bilaterally. No crackles, wheezing, or rhonchi.     ABDOMINAL/GI: Soft, non-distended. Normoactive bowel sounds. No tenderness to palpation, guarding, or rebound tenderness.   EXTREMITIES: Extremities are warm and well perfused.  No pitting edema of the lower extremities.  MUSCULOSKELETAL: Longitudinal surgical scar midline of back. Tenderness to palpation of midline spine at site of prior surgery with possible breakdown of surgical incision site. No joint tenderness, swelling or erythema.   NEUROLOGICAL:  Reduced sensation to touch on anterior aspect of L leg. Decreased plantar flexion and extension of R and L foot b/l. Decreased strength 3/5 in R L with 4/5 L leg. CN 2-12 intact on L half of face. Weakened CN 5 and 7 on R side of face, rest normal.     Laboratory results  WBC 5.8 (08/10) HGB 13.1* (08/10) PLT  249 (08/10)    HCT 41.5 (08/10)      Na 138 (08/10) CL 99 (08/10) BUN 20 (08/10) GLU   106* (08/10)   K 3.8 (08/10) CO2 23 (08/10) Cr 1.00 (08/10)      Brian Ritter 9.4 (08/10) Mg   Phos    TP 7.8 (08/10) ALT 109* (08/10) TBILI 0.60 (08/10) ALK PHOS  153* (08/10)   ALB 3.9 (08/10) AST 93* (08/10) DBILI        ESR 51* (08/10) CRP 0.21 (08/10)  Lactate 2.0 (08/10)    Microbiology  Bcx pending    Imaging studies  IMPRESSION:  CT L spine  Status post T8-L1 laminectomy. The at the right T11 pedicle screws extend medial to the pedicle into the spinal canal. Evaluation of the soft tissues is limited due to artifact from hardware.   Multilevel  degenerative changes are present.     IMPRESSION: CT T-spine  Status post T8-L1 laminectomy. The at the right T11 pedicle screws extend medial to the pedicle into the spinal canal. Evaluation of the soft tissues is limited due to artifact from hardware.   Multilevel degenerative changes are present.     IMPRESSION: XR T-spine  No complications seen at thoracolumbar instrumentation..    ASSESSMENT AND PLAN   Brian Ritter is a 43 year old male with hx of ankylosing spondylitis, Ehler's Danlos, autism, and recent spinal fusion surgery c/b postop seroma with MRSA presenting with acute onset numbness and weakness, in setting of non-adherence to daptomycin.    # Acute onset leg numbness in setting of recent spinal surgery July 2020  While MRI is optimal imaging for cord compression, given that patient does not endorse signs of cord compression and given the patchy neuro distribution of his weakness rather than loss of sensation/movement below a specific vertebral level, less likely cord compression with reassuring CT findings. Given his possible surgical site breakdown with non-adherence to abx and exquisite TTP of his back at the site of the scar, infection is high on ddx despite normal WBC and afebrile presentation  - ID consult in AM. Started daptomycin O/N  - Ortho consulted whether acute surgical management needed    # Autism- patient reports that he is very anxious without his partner and would like to have him in hospital. Unable to reach partner because not responding to calls. Team told by patient that partner is in the parking lot in a "blue van"    # Hep C-- Not treated. Per [facility] Everywhere, he is Hep C Ab+ and aware of his diagnosis but has not been treated. 06/02/2019 Hep C Ab+ and Hep C RNA PCR 2,750,000  - f/u treatment outpatient     Fluids: none  Electroytes: replete prn  N: regular  DVT ppx: lvx  Code: full code    Brian Chyle, MD  Internal Medicine PGY-1  [place] [facility] [place], [place]  [address]    The patient was staffed with the attending physician Dr. Murray Ritter, Macy Mis, *.

## 2019-06-20 NOTE — ED Notes (Signed)
Pt to xray

## 2019-06-20 NOTE — ED Notes (Signed)
First contact with patient.   No redness or hives noted to R arm. Patient reports that he feels sleepy/tired; patient concerned. Patient reassured and Advised this can be a side effect of benadryl. Patient verbalized understanding.

## 2019-06-20 NOTE — ED Notes (Addendum)
Patient reporting new numbness and pain into right medial thigh into groin which has started happening over the last couple of hours. 7/10 pain. ERMD Suzie Portela made aware.

## 2019-06-20 NOTE — ED Notes (Addendum)
The patient has complaints of his surgical wound site feeling different.  Dressings removed on his back and noted distal section to be dark purple, some open/ dehiscence and tenderness.  No drainage noted. MD Suzie Portela made aware and advised.  The patient was updated on POC and actions at this time.

## 2019-06-20 NOTE — ED Notes (Signed)
Patient ambulated to restroom to attempt to have bowel movement. Patient ambulated with steady gait

## 2019-06-20 NOTE — ED Notes (Signed)
Pt take over, introduction to the patient, ax complete, c/o RIGHT face numbness and doesn't feel right.  MD finch advised.

## 2019-06-20 NOTE — ED Notes (Signed)
Brian Ritter (mother): [phone]

## 2019-06-21 ENCOUNTER — Inpatient Hospital Stay (HOSPITAL_COMMUNITY): Payer: Medicaid Other

## 2019-06-21 DIAGNOSIS — Z981 Arthrodesis status: Secondary | ICD-10-CM

## 2019-06-21 DIAGNOSIS — M459 Ankylosing spondylitis of unspecified sites in spine: Secondary | ICD-10-CM | POA: Diagnosis present

## 2019-06-21 DIAGNOSIS — M5136 Other intervertebral disc degeneration, lumbar region: Secondary | ICD-10-CM

## 2019-06-21 DIAGNOSIS — M546 Pain in thoracic spine: Secondary | ICD-10-CM

## 2019-06-21 DIAGNOSIS — M5134 Other intervertebral disc degeneration, thoracic region: Secondary | ICD-10-CM

## 2019-06-21 DIAGNOSIS — G834 Cauda equina syndrome: Secondary | ICD-10-CM

## 2019-06-21 LAB — PHOSPHORUS, BLOOD: Phosphorous: 5 mg/dL — ABNORMAL HIGH (ref 2.7–4.5)

## 2019-06-21 LAB — BASIC METABOLIC PANEL, BLOOD
Anion Gap: 11 mmol/L (ref 7–15)
BUN: 23 mg/dL — ABNORMAL HIGH (ref 6–20)
Bicarbonate: 27 mmol/L (ref 22–29)
Calcium: 8.9 mg/dL (ref 8.5–10.6)
Chloride: 96 mmol/L — ABNORMAL LOW (ref 98–107)
Creatinine: 1 mg/dL (ref 0.67–1.17)
GFR: >60 mL/min
Glucose: 108 mg/dL — ABNORMAL HIGH (ref 70–99)
Potassium: 4 mmol/L (ref 3.5–5.1)
Sodium: 134 mmol/L — ABNORMAL LOW (ref 136–145)

## 2019-06-21 LAB — LIVER PANEL, BLOOD
ALT (SGPT): 110 U/L — ABNORMAL HIGH (ref 0–41)
AST (SGOT): 98 U/L — ABNORMAL HIGH (ref 0–40)
Albumin: 3.4 g/dL — ABNORMAL LOW (ref 3.5–5.2)
Alkaline Phos: 136 U/L — ABNORMAL HIGH (ref 40–129)
Bilirubin, Dir: <0.2 mg/dL (ref <0.2)
Bilirubin, Tot: 0.6 mg/dL (ref <1.2)
Total Protein: 6.8 g/dL (ref 6.0–8.0)

## 2019-06-21 LAB — CBC WITH DIFF, BLOOD
ANC-Automated: 1.3 10*3/uL — ABNORMAL LOW (ref 1.6–7.0)
Abs Basophils: 0 10*3/uL (ref <0.1)
Abs Eosinophils: 0.3 10*3/uL (ref 0.1–0.5)
Abs Lymphs: 1.5 10*3/uL (ref 0.8–3.1)
Abs Monos: 0.8 10*3/uL (ref 0.2–0.8)
Basophils: 1 %
Eosinophils: 7 %
Hct: 38.8 % — ABNORMAL LOW (ref 40.0–50.0)
Hgb: 12.5 gm/dL — ABNORMAL LOW (ref 13.7–17.5)
Lymphocytes: 38 %
MCH: 29.3 pg (ref 26.0–32.0)
MCHC: 32.2 g/dL (ref 32.0–36.0)
MCV: 91.1 um3 (ref 79.0–95.0)
MPV: 9.2 fL — ABNORMAL LOW (ref 9.4–12.4)
Monocytes: 20 %
Plt Count: 202 10*3/uL (ref 140–370)
RBC: 4.26 10*6/uL — ABNORMAL LOW (ref 4.60–6.10)
RDW: 15.2 % — ABNORMAL HIGH (ref 12.0–14.0)
Segs: 33 %
WBC: 3.9 10*3/uL — ABNORMAL LOW (ref 4.0–10.0)

## 2019-06-21 LAB — MAGNESIUM, BLOOD: Magnesium: 1.8 mg/dL (ref 1.6–2.6)

## 2019-06-21 MED ORDER — SODIUM CHLORIDE 0.9 % IJ SOLN (CUSTOM)
3.0000 mL | Freq: Three times a day (TID) | INTRAMUSCULAR | Status: DC
Start: 2019-06-21 — End: 2019-06-23
  Administered 2019-06-21 – 2019-06-23 (×5): 3 mL via INTRAVENOUS

## 2019-06-21 MED ORDER — VANCOMYCIN PER PHARMACY
INTRAVENOUS | Status: DC
Start: 2019-06-21 — End: 2019-06-21

## 2019-06-21 MED ORDER — LORAZEPAM 2 MG/ML IJ SOLN
1.0000 mg | Freq: Once | INTRAMUSCULAR | Status: AC
Start: 2019-06-21 — End: 2019-06-21
  Administered 2019-06-21: 1 mg via INTRAVENOUS
  Filled 2019-06-21: qty 1

## 2019-06-21 MED ORDER — VANCOMYCIN HCL IN NACL 1.5-0.9 GM/500ML-% IV SOLN
1500.0000 mg | Freq: Once | INTRAVENOUS | Status: DC
Start: 2019-06-21 — End: 2019-06-21
  Filled 2019-06-21: qty 500

## 2019-06-21 MED ORDER — SODIUM CHLORIDE 0.9% TKO INFUSION
INTRAVENOUS | Status: DC | PRN
Start: 2019-06-21 — End: 2019-06-23

## 2019-06-21 MED ORDER — LORAZEPAM 2 MG/ML IJ SOLN
1.0000 mg | Freq: Once | INTRAMUSCULAR | Status: DC
Start: 2019-06-21 — End: 2019-06-21

## 2019-06-21 MED ORDER — NALOXONE HCL 0.4 MG/ML IJ SOLN
0.1000 mg | INTRAMUSCULAR | Status: DC | PRN
Start: 2019-06-21 — End: 2019-06-23

## 2019-06-21 MED ORDER — GADOBUTROL 1 MMOL/ML IV SOLN
INTRAVENOUS | Status: AC
Start: 2019-06-21 — End: 2019-06-21
  Filled 2019-06-21: qty 10

## 2019-06-21 MED ORDER — GADOBUTROL 1 MMOL/ML IV SOLN
7.5000 mL | Freq: Once | INTRAVENOUS | Status: AC
Start: 2019-06-21 — End: 2019-06-21
  Administered 2019-06-21: 7.5 mL via INTRAVENOUS
  Filled 2019-06-21: qty 7.5

## 2019-06-21 MED ORDER — ENOXAPARIN SODIUM 40 MG/0.4ML SC SOLN
40.0000 mg | Freq: Every day | SUBCUTANEOUS | Status: DC
Start: 2019-06-21 — End: 2019-06-22
  Filled 2019-06-21: qty 1

## 2019-06-21 MED ORDER — SODIUM CHLORIDE 0.9 % IJ SOLN (CUSTOM)
3.0000 mL | INTRAMUSCULAR | Status: DC | PRN
Start: 2019-06-21 — End: 2019-06-23
  Administered 2019-06-22 (×2): 3 mL via INTRAVENOUS

## 2019-06-21 NOTE — Interdisciplinary (Signed)
06/21/19 1122   Discharge Planning   CM discussed the following with pt, and/or family, and/or DPOA Brian Ritter has agreements with select post-acute care providers in the collaborative care network;If patient has chosen [facility] or [facility] of [facility] for post-acute care, they were informed that we partner with, and have a financial interest in these organizations   Readmission Risk Assessment   Readmission Within 30 Days of Discharge * No

## 2019-06-21 NOTE — Plan of Care (Signed)
Problem: Promotion of Health and Safety  Goal: Promotion of Health and Safety  Description: The patient remains safe, receives appropriate treatment and achieves optimal outcomes (physically, psychosocially, and spiritually) within the limitations of the disease process by discharge.    Information below is the current care plan.  Outcome: Progressing  Flowsheets  Taken 06/21/2019 0826 by Verdie Drown, RN  Individualized Interventions/Recommendations #4 (if applicable): educate patient on the importance of getting lovenox, patient refused  Individualized Interventions/Recommendations #5 (if applicable): reinforce patient on wearing back brace when OOB, patient refuse  Taken 06/21/2019 0800 by Verdie Drown, RN  Patient /Family stated Goal: let me eat and sleep, leave me alone   Taken 06/21/2019 0346 by Frutoso Chase, RN  Guidelines: Inpatient Nursing Guidelines  Individualized Interventions/Recommendations #1: assess pain and provide pain regimen as needed  Individualized Interventions/Recommendations #2 (if applicable): assess for signs and symptoms of infection  Individualized Interventions/Recommendations #3 (if applicable): assess back incision

## 2019-06-21 NOTE — Interdisciplinary (Signed)
06/21/19 1123   Initial Assessment   CM Initial Assessment * Completed  (per chart review, patient unavailable for assessment)   Patient Information   Where was the patient admitted from? * Home   Prior to Level of Function * Ambulatory/Independent with ADL's   Assistive Device * Not applicable   Prior Community Resources None   Primary Caretaker(s) * Self   Primary Contact Name, Number and Relationship * CONE, [place] OTHER   [phone]   Permission to Contact * Yes   Secondary Contact Name, Number and Relationship N/A   Conservator/Public Guardian Name and [place] Art therapist History Not Applicable   Veterans Affiliation No   Discharge Planning   Living Arrangements * Spouse /Significant Other   Available Assistance/Support System * Case manager/social worker;Spouse / significant other   Type of Residence * [place] * No   Additional Services Not Applicable   Anticipated Discharge Dispostion/Needs Home   Patient's Discharge Goal(s) Home   Barriers to Discharge * Awaiting clinical improvement;Awaiting consult input   Do you have difficulty affording your medications No   Patient/Family/Other Engaged in Discharge Planning * Yes   Name, Relationship and Phone Number of Person Engaged in the Discharge Plan Patient   Patient Has Decision Making Capacity * Yes   Patient/Family/Legal/Surrogate Decision Maker Has Been Given a List Options And Choice In The Selection of Post-Acute Care Providers * Not Applicable   CM discussed the following with pt, and/or family, and/or DPOA [place] has agreements with select post-acute care providers in the collaborative care network;If patient has chosen [facility] or [facility] for post-acute care, they were informed that we partner with, and have a financial interest in these organizations   Legal Designee/Surrogate  Name/Relationship/Contact Info N/A   Family/Caregiver's Assessed for * Not [place] * Not Applicable   Patient/Family/Other Are In Agreement With Discharge Plan * To be determined   [place] * No   Social Worker Consult   Do you need to see a Education officer, museum? * No   Readmission Risk Assessment   Readmission Within 30 Days of Discharge * No   Recent Hospitalizations (Within Last 6 Months) * No   High Risk For Readmission * No   MOON   MOON Provided to Patient Not Applicable

## 2019-06-21 NOTE — Interdisciplinary (Signed)
Shift change report came to say hi to patient, MD at bedside. Per patient "I don't fucking want to see anyone until I get my food". Informed patient that breakfast will be here in 30 min.

## 2019-06-21 NOTE — Interdisciplinary (Signed)
06/21/19 1115   Initial Assessment   CM Initial Assessment * Completed  (Per chart review, patient unavailable for assessment)   Patient Information   Where was the patient admitted from? * Home   Prior to Level of Function * Ambulatory/Independent with ADL's   Assistive Device * Not applicable   Prior Community Resources None   Primary Caretaker(s) * Self   Primary Contact Name, Number and Relationship * CONE, [place] OTHER   [phone]   Permission to Contact * Yes   Secondary Contact Name, Number and Relationship N/A   Conservator/Public Guardian Name and [place] Art therapist History Not [place] Affiliation No   Discharge Planning   Living Arrangements * Spouse /Significant Other   Available Assistance/Support System * Case manager/social worker;Spouse / significant other   Type of Residence * Private Residence;[place] * No   Additional Services Not Applicable   Anticipated Discharge Dispostion/Needs Home   Patient's Discharge Goal(s) Home   Barriers to Discharge * Awaiting clinical improvement;Awaiting consult input   Do you have difficulty affording your medications No   Patient/Family/Other Engaged in Discharge Planning * Yes   Name, Relationship and Phone Number of Person Engaged in the Discharge Plan Patient   Patient Has Decision Making Capacity * Yes   Patient/Family/Legal/Surrogate Decision Maker Has Been Given a List Options And Choice In The Selection of Post-Acute Care Providers * Not Applicable   Legal Designee/Surrogate Name/Relationship/Contact Info N/A   Family/Caregiver's Assessed for * Not Applicable   Respite Care * Not Applicable   Patient/Family/Other Are In Agreement With Discharge Plan * To be determined   Public Health Clearance Needed * Not Applicable   Social Worker Consult   Do you need to see a Education officer, museum? * No   Readmission Risk Assessment      Readmission Within 30 Days of Discharge * No   Recent Hospitalizations (Within Last 6 Months) * No   MOON   MOON Provided to Patient Not Applicable   Medical UXNATFTDD:43 year old male with No past medical history on file. of now admitted with Back Pain (Pt had spine fusion about a week ago, has hardware in back, was with friend and getting up out of the car and felt like something may have shifted and pt then had a sharp pain that went down his L leg and has since has numbness to toes in L foot. )    LOS at time of Initial Assessment: 1 Day 6 Hours  Pt admitted on 06/20/2019  4:27 AM    LACE+ Score: 47    PCP verified:  No Pcp, Per Patient  No address on file  telephone numberNone  fax numberNone    Pharmacy:  [id number] - [place], [place] - [address]    PLOF: Independent    Hx of SNF placement:  None    Hx of Home health services:  None    DME: None     DISCHARGE PLANNING    Support system: Family    Anticipated DC disposition: Home with significant order   [place] [id number]    Anticipated DC needs : None    Anticipated barriers to discharge: None    Transportation: [place] to transport  Expected discharge date:  TBD

## 2019-06-21 NOTE — Interdisciplinary (Signed)
Patient upset, yelling, and threw cups and bin on the floor because he ordered food via [employer], but food missing. Primary RN and resource RN check in lobby and outside hospital, but no food found. Offered hospital food, but patient refused. Patient refusing to get temperature checked. IV Morphine 4mg  given for c/o 10/10 back pain. Will continue to monitor.

## 2019-06-21 NOTE — Plan of Care (Signed)
Problem: Promotion of Health and Safety  Goal: Promotion of Health and Safety  Description: The patient remains safe, receives appropriate treatment and achieves optimal outcomes (physically, psychosocially, and spiritually) within the limitations of the disease process by discharge.    Information below is the current care plan.  Outcome: Progressing  Flowsheets  Taken 06/21/2019 0346  Guidelines: Inpatient Nursing Guidelines  Individualized Interventions/Recommendations #1: assess pain and provide pain regimen as needed  Individualized Interventions/Recommendations #2 (if applicable): assess for signs and symptoms of infection  Individualized Interventions/Recommendations #3 (if applicable): assess back incision  Outcome Evaluation (rationale for progressing/not progressing) every shift: Patient received in [place], A & O X 4, no acute distress. Patient c/o 9/10 back pain. Morphine 4mg  IV given per PRN order. Patient involve in plan of care. T-L spine MRI ordered stat, per MRI tech, will call when available. Will continue to monitor.  Taken 06/21/2019 0217  Patient /Family stated Goal: pain control

## 2019-06-21 NOTE — Progress Notes (Signed)
[place] Hospital day:   1 day - Admitted on: 06/20/2019    Brian Ritter is a 43 year old male with pertinent history of ankylosing spondylitis, Ehlers-Danlos, autism, and recent spinal fusion surgery at OSH c/b postop MRSA seroma who presented with bilateral LE numbness, weakness, and pain, now improving.    24 Hour - Interval Events   Received daptomycin IV x1 yesterday in ED, and afterward developed hives in UE near IV site. Also endorsed some chest discomfort during episode    Subjective   - Lives in [place] with partner, who relies on him for care. Typically stays in [facility], and needs to contact his partner to determine best location to receive IV abx as outpatient.  - States that his LE weakness, numbness, and pain are now resolved. Has residual LLE numbness which is chronic    Physical Exam   No data recorded  Pulse  Min: 72  Max: 96  BP  Min: 92/61  Max: 116/54  Resp  Min: 15  Max: 20  SpO2  Min: 98 %  Max: 99 %    BP 92/61 (BP Location: Left arm, BP Patient Position: Lying right side)    Pulse 96    Temp 98.1 F (36.7 C)    Resp 18    Ht 5' 7"  (1.702 m)    Wt 76.9 kg (169 lb 8.5 oz)    SpO2 98%    BMI 26.55 kg/m  O2 Device: None (Room air)      08/10 0600 - 08/11 0559  In: 153 [P.O.:150; I.V.:3]  Out: -   Urine x 1 Stool x 0 Emesis x 0     General:  WDWN, in no acute distress, resting comfortably in hospital bed  Abdomen:  No masses appreciated, nontender to palpation  Skin: Longitudinal surgical scar along midline of back with mild TTP over scar in lumbar region and numbness over scar in thoracic region. White granulomatous tissue within scar, with some dehiscence  Extremities:  Warm and well perfused, no edema appreciated bilaterally  Vascular: Regular rate and volume of dorsalis pedis pulses bilaterally  Neuro:  Decreased sensation over RLE    Strength  R/L  Biceps  5/5  Triceps 5/5  Finger grip 5/5  Hip flexion 4/5  Plantarflexion 4/5  Dorsiflexion 4/5    Pertinent Labs  and Imaging and Meds     WBC 3.9* (08/11) HGB 12.5* (08/11) PLT 202 (08/11)    HCT 38.8* (08/11)      Na 134* (08/11) CL 96* (08/11) BUN 23* (08/11) GLU   108* (08/11)   K 4.0 (08/11) CO2 27 (08/11) Cr 1.00 (08/11)      TP 6.8 (08/11) ALT 110* (08/11) TBILI 0.60 (08/11) ALK PHOS  136* (08/11)   ALB 3.4* (08/11) AST 98* (08/11) DBILI <0.2 (08/11)      Phos 5.0  Mag 1.8    Scheduled Medications   enoxaparin  40 mg Daily    sodium chloride  3 mL Q8H     Continuous Medications   sodium chloride       PRN Medications   morphine  4 mg Q4H PRN    nalOXone  0.1 mg Q2 Min PRN    sodium chloride  3 mL PRN    sodium chloride   Continuous PRN     CT L-spine, T-spine with IV contrast 06/20/19:  Status post T8-L1 laminectomy. The the right T11 pedicle  screws extend medial to the pedicle into the spinal canal. Evaluation of the soft tissues is limited due to artifact from hardware. Multilevel degenerative changes are present.    Assessment / Plan   Leanard Dimaio is a 43 year old male with pertinent history of ankylosing spondylitis, Ehlers-Danlos, autism, and recent spinal fusion surgery at OSH c/b postop MRSA seroma who presented with bilateral LE numbness, weakness, and pain, now improving.    #Post-op seroma c/b MRSA infection  MRSA infection and seroma most likely result of post-op complications, s/p daptomycin at OSH which was discontinued when he left AMA, and another dose of daptomycin in ED yesterday. Developed rash near IV line after administration, so will switch to vancomycin per ID recs. Per ID, Mr. Hewins recent spinal surgery and hardware make him high risk for infection, so will require 6 weeks IV abx.  - ID and ortho spine consulted, following recs  - MRI spine, final recs pending results  - Per ID, hold abx until plan finalized regarding surgical intervention. If no surgical intervention, I&D could provide culture which may be less reliable with abx. If surgical intervention planned, will start 6 weeks  IV vancomycin with consideration for oral suppression therapy after given hardware  - Case manager contacted regarding setting up long-term IV abx    #Bilateral lower extremity weakness, numbness  Iimproving, no evidence of contrast enhancing fluid collection on CT spine, but MRI better modality for imaging soft tissue and spinal cord. Most likely etiology for acute onset lower extremity weakness and numbness is spinal cord compression from seroma, so will manage infection with abx per ID recommendations. CT scan shows R T11 pedicle screw extending into canal, which may also be exacerbating symptoms.  - MRI spine  - Ortho spine consulted, appreciate recs pending MRI    # Hep C  Per Care Everywhere, Hep C Ab+ and aware of his diagnosis but no evidence of treatment.   - F/u treatment outpatient     Discharge planning: home health antibiotics  Foley: No foley catheter  VTE prophylaxis: lovenox  Diet: Diet Regular  Last BM: none since admission  IV fluids: No IV fluids  Access: PIVs  Code status: Full Code    Patient care was discussed in detail with Farrel Conners, MD.  Note written by [name], MS4

## 2019-06-21 NOTE — Interdisciplinary (Signed)
Acknowledged  RN Admission Summary Weight Loss Trigger:    Weights (last 14 days)     Date/Time Weight Weight Source Percentage Weight Change (%) Who    06/21/19 0021  76.9 kg (169 lb 8.5 oz)  Standing scale  0 % AB    06/20/19 0424  76.9 kg (169 lb 8.5 oz)  Standing scale  0 % KB            Wt Readings from Last 20 Encounters:   06/21/19 76.9 kg (169 lb 8.5 oz)     UBW: "160lb something Jesus [place]"  % Weight Loss from current weight: 0%  Time Frame of Weight loss: "Couple weeks ago"  WT loss: unintentional    Per pt "I lost couple "Z@@@@@@" pounds after surgery", "that's it".  Appetite good.     Food Allergies: NKFA    > Offered pt nutrition supplement, pt declined.      Relayed findings to RD via EMR. Will continue to f/u per policy.   Darrel Reach, DTR
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
## 2019-06-22 ENCOUNTER — Inpatient Hospital Stay (HOSPITAL_COMMUNITY): Payer: Medicaid Other

## 2019-06-22 DIAGNOSIS — Z452 Encounter for adjustment and management of vascular access device: Secondary | ICD-10-CM

## 2019-06-22 DIAGNOSIS — T847XXA Infection and inflammatory reaction due to other internal orthopedic prosthetic devices, implants and grafts, initial encounter: Principal | ICD-10-CM

## 2019-06-22 LAB — LIVER PANEL, BLOOD
ALT (SGPT): 112 U/L — ABNORMAL HIGH (ref 0–41)
AST (SGOT): 91 U/L — ABNORMAL HIGH (ref 0–40)
Albumin: 3.5 g/dL (ref 3.5–5.2)
Alkaline Phos: 164 U/L — ABNORMAL HIGH (ref 40–129)
Bilirubin, Dir: <0.2 mg/dL (ref <0.2)
Bilirubin, Tot: 0.3 mg/dL (ref <1.2)
Total Protein: 7.2 g/dL (ref 6.0–8.0)

## 2019-06-22 LAB — CBC WITH DIFF, BLOOD
ANC-Automated: 1.6 10*3/uL (ref 1.6–7.0)
Abs Basophils: 0 10*3/uL (ref <0.1)
Abs Eosinophils: 0.4 10*3/uL (ref 0.1–0.5)
Abs Lymphs: 2.1 10*3/uL (ref 0.8–3.1)
Abs Monos: 0.8 10*3/uL (ref 0.2–0.8)
Basophils: 0 %
Eosinophils: 7 %
Hct: 40.3 % (ref 40.0–50.0)
Hgb: 12.7 gm/dL — ABNORMAL LOW (ref 13.7–17.5)
Lymphocytes: 44 %
MCH: 29.1 pg (ref 26.0–32.0)
MCHC: 31.5 g/dL — ABNORMAL LOW (ref 32.0–36.0)
MCV: 92.4 um3 (ref 79.0–95.0)
MPV: 9.4 fL (ref 9.4–12.4)
Monocytes: 16 %
Plt Count: 208 10*3/uL (ref 140–370)
RBC: 4.36 10*6/uL — ABNORMAL LOW (ref 4.60–6.10)
RDW: 15 % — ABNORMAL HIGH (ref 12.0–14.0)
Segs: 32 %
WBC: 4.9 10*3/uL (ref 4.0–10.0)

## 2019-06-22 LAB — BASIC METABOLIC PANEL, BLOOD
Anion Gap: 12 mmol/L (ref 7–15)
BUN: 26 mg/dL — ABNORMAL HIGH (ref 6–20)
Bicarbonate: 27 mmol/L (ref 22–29)
Calcium: 8.7 mg/dL (ref 8.5–10.6)
Chloride: 97 mmol/L — ABNORMAL LOW (ref 98–107)
Creatinine: 1.15 mg/dL (ref 0.67–1.17)
GFR: >60 mL/min
Glucose: 94 mg/dL (ref 70–99)
Potassium: 4.1 mmol/L (ref 3.5–5.1)
Sodium: 136 mmol/L (ref 136–145)

## 2019-06-22 LAB — PROTHROMBIN TIME, BLOOD
INR: 1
PT,Patient: 11.8 s (ref 9.7–12.5)

## 2019-06-22 LAB — PHOSPHORUS, BLOOD: Phosphorous: 4.1 mg/dL (ref 2.7–4.5)

## 2019-06-22 LAB — MAGNESIUM, BLOOD: Magnesium: 2 mg/dL (ref 1.6–2.6)

## 2019-06-22 MED ORDER — OXYCODONE HCL 5 MG OR TABS
2.5000 mg | ORAL_TABLET | ORAL | Status: DC | PRN
Start: 2019-06-22 — End: 2019-06-23
  Administered 2019-06-23: 2.5 mg via ORAL
  Filled 2019-06-22: qty 1

## 2019-06-22 MED ORDER — OXYCODONE HCL 5 MG OR TABS
5.0000 mg | ORAL_TABLET | ORAL | Status: DC | PRN
Start: 2019-06-22 — End: 2019-06-23
  Administered 2019-06-22 – 2019-06-23 (×7): 5 mg via ORAL
  Filled 2019-06-22 (×8): qty 1

## 2019-06-22 MED ORDER — SODIUM CHLORIDE 0.9 % IJ SOLN (CUSTOM)
10.0000 mL | INTRAMUSCULAR | Status: DC | PRN
Start: 2019-06-22 — End: 2019-06-23

## 2019-06-22 MED ORDER — SODIUM CHLORIDE 0.9 % IV SOLN
8.0000 mg/kg | INTRAVENOUS | Status: DC
Start: 2019-06-22 — End: 2019-06-23
  Administered 2019-06-22: 600 mg via INTRAVENOUS
  Filled 2019-06-22 (×2): qty 600

## 2019-06-22 MED ORDER — ACETAMINOPHEN 325 MG PO TABS
975.0000 mg | ORAL_TABLET | Freq: Three times a day (TID) | ORAL | Status: DC | PRN
Start: 2019-06-22 — End: 2019-06-23

## 2019-06-22 MED ORDER — HYDROXYZINE HCL 25 MG OR TABS
25.0000 mg | ORAL_TABLET | Freq: Once | ORAL | Status: AC
Start: 2019-06-22 — End: 2019-06-23
  Filled 2019-06-22: qty 1

## 2019-06-22 MED ORDER — HEPARIN SODIUM LOCK FLUSH 100 UNIT/ML IJ SOLN CUSTOM
100.0000 [IU] | Freq: Once | INTRAVENOUS | Status: DC | PRN
Start: 2019-06-22 — End: 2019-06-23

## 2019-06-22 MED ORDER — OXYCODONE HCL 5 MG OR TABS
2.5000 mg | ORAL_TABLET | ORAL | Status: DC | PRN
Start: 2019-06-22 — End: 2019-06-23

## 2019-06-22 MED ORDER — LIDOCAINE HCL 1 % IJ SOLN
5.0000 mL | Freq: Once | INTRAMUSCULAR | Status: AC | PRN
Start: 2019-06-22 — End: 2019-06-23

## 2019-06-22 MED ORDER — SODIUM CHLORIDE 0.9 % IJ SOLN (CUSTOM)
10.0000 mL | Freq: Three times a day (TID) | INTRAMUSCULAR | Status: DC
Start: 2019-06-22 — End: 2019-06-23
  Administered 2019-06-22 – 2019-06-23 (×4): 10 mL

## 2019-06-22 MED ORDER — DIPHENHYDRAMINE HCL 50 MG/ML IJ SOLN
25.0000 mg | Freq: Once | INTRAMUSCULAR | Status: DC | PRN
Start: 2019-06-22 — End: 2019-06-23

## 2019-06-22 NOTE — Plan of Care (Signed)
Problem: Promotion of Health and Safety  Goal: Promotion of Health and Safety  Description: The patient remains safe, receives appropriate treatment and achieves optimal outcomes (physically, psychosocially, and spiritually) within the limitations of the disease process by discharge.    Information below is the current care plan.  Outcome: Progressing  Flowsheets  Taken 06/22/2019 0113 by Izetta Dakin, RN  Individualized Interventions/Recommendations #1: IV morphine administered for pain control with effective result  Individualized Interventions/Recommendations #2 (if applicable): Continue to asses for sign and symptoms of infection to the back incision  Individualized Interventions/Recommendations #3 (if applicable): NPO after midnight for I and D  Outcome Evaluation (rationale for progressing/not progressing) every shift: VSS, continue pain control, awaiting MRI result, NPO after midnight for possible I and D today, continue plan of care as ordered, will continue to monitor  Taken 06/21/2019 1950 by Izetta Dakin, RN  Patient /Family stated Goal: Eat my dinner and get snack before midnight  Taken 06/21/2019 0826 by Verdie Drown, RN  Individualized Interventions/Recommendations #5 (if applicable): reinforce patient on wearing back brace when OOB, patient refuse  Taken 06/21/2019 0346 by Frutoso Chase, RN  Guidelines: Inpatient Nursing Guidelines

## 2019-06-22 NOTE — Progress Notes (Signed)
Infectious Diseases Progress Note    Date of service: 06/22/19  Hospital day#:   2 days - Admitted on: 06/20/2019  Provider requesting consultation: Farrel Conners, MD    Reason for consultation: Post operative thoracolumbar spinal fusion complicated by MRSA infection    ID: Brian Ritter is a 43 year old male with history of Ehler's Danlos syndrome, ankylosing spondylitis and mutliple spinal surgeries, who presented after leaving from [employer] with worsening spinal pain concerning for postoperative infection.  Id is consulted regarding postoperative thoracolumbar spinal fusion with hardware in place complicated by MRSA infection diagnosis [facility].    Events/Subjective: NAEON. Patient currently NPO for possible procedure. He and partner have determined to stay in [place]. Denies fevers/chills, numbness in bilat LE. Reports back pain.     ANTIMICROBIALS:  Current:  Daptomycin 8/10-present    Prior:  Daptomycin 8/3 to 8/7    PHYSICAL EXAM   Temperature:  [96.2 F (35.7 C)-98.6 F (37 C)] 98.6 F (37 C) (08/12 1530)  Blood pressure (BP): (95-132)/(57-69) 95/60 (08/12 1530)  Heart Rate:  [76-94] 76 (08/12 1530)  Respirations:  [16-18] 17 (08/12 1530)  Pain Score: Patient Sleeping, Respiratory Assessment Done (08/12 1350)  O2 Device: None (Room air) (08/12 1530)  SpO2:  [96 %-98 %] 97 % (08/12 1530)  GEN:  Alert and oriented x3, no acute distress  HENT:  Normocephalic atraumatic, oropharynx clear  CV:  Regular rate and rhythm, no murmurs  PULM:  Clear to auscultation all lung fields  Back:  Thoracolumbar surgical scar with sutures in place.  Palpable swelling superior aspect of surgical wound with erythema, no drainage.  ABD:  Soft and nontender  EXT:  No edema  SKIN:  Multiple tattoos, no rashes  NEURO:  Nonfocal    LABS (All pertinent labs reviewed in EPIC and notable for):  Lab Results   Component Value Date    WBC 4.9 06/22/2019    RBC 4.36 (L) 06/22/2019    HGB 12.7 (L) 06/22/2019    HCT 40.3 06/22/2019    MCV  92.4 06/22/2019    MCHC 31.5 (L) 06/22/2019    RDW 15.0 (H) 06/22/2019    PLT 208 06/22/2019    MPV 9.4 06/22/2019     BMP:  136/4.1/97/27/26/1.15/94/8.7 (08/12 0535)      MICRO:  8/10 blood cultures x2 no growth to date      At OSH:  7/28 body fluid culture-MRSA (S:  Daptomycin, clindamycin, rifampin, tetracycline, TMP-SMX, vancomycin)  8/3 body fluid-MRSA) clindamycin, rifampin, tetracycline, TMP-SMX, Vanco)    IMAGING (personally reviewed by me and notable for):  8/10 CT T and L-spine:   Status post T8-L1 laminectomy. The at the right T11 pedicle screws extend medial to the pedicle into the spinal canal. Evaluation of the soft tissues is limited due to artifact from hardware.  Multilevel degenerative changes are present    8/11 MRI L and T-spine:  1. Enhancement of the ventral cauda equina nerve roots, nonspecific. This may be related to thecal sac violation, such as with lumbar puncture. Please correlate with recent procedural history. If there has been no recent instrumentation, intrathecal infection cannot be excluded. Reassuringly, no definite purulent material or debris layering within the dependent portion of the thecal sac is noted. Other differential considerations for nerve root enhancement could include inflammatory processes such as Guillain-Barre.    2. Redemonstrated postoperative changes of posterior decompression and instrumented fusion from T8-L1. Fluid collection in the midline subcutaneous tissues extending from T5 to L3 (  approximate craniocaudal extent of 27 cm) without definite extension to the epidural space or central spinal canal. This could represent either a sterile or infected postoperative fluid collection, which cannot be distinguished based on imaging features. If there is concern for infection, fluid sampling should be performed.    3. No abnormal intrathecal enhancement within the thoracic spine.    4. Multilevel degenerative changes without high-grade central spinal canal  stenosis in either the thoracic or lumbar spine, within the limitations of metallic artifact from the patient's spinal fusion hardware. Recommend uploading prior images for comparison of interval change.    8/12 CXR: No acute cardiopulmonary findings. Right arm PICC in good position.    Assessment and Plan:  Brian Ritter is a 43 year old male with history of Ehler's Danlos syndrome, ankylosing spondylitis and mutliple spinal surgeries, who presented after leaving from [place] with worsening spinal pain concerning for postoperative infection.  ID is consulted regarding postoperative thoracolumbar spinal fusion with hardware in place complicated by MRSA infection diagnosis Ho[facility]. Unfortunately, with recently placed hardware, open surgical wound, and documented infection, patient will need long duration of antibiotic therapy to mange this infection. Has collection of fluid at superior aspect that may be amenable to washout    #Thoracolumbar spinal infection with hardware in place  -Patient preferring to follow at infusion center for IV antibiotics, therefore would recommend daptomycin 685m daily (patient seems to have tolerated in ED)  -Patient will need 6 weeks IV therapy then consideration for oral suppression therapy afterwards, given remaining hardware  -No need for PICC placement if patient going to infusion center for daptomycin.   - If the patient leaves AMA, does not want IV antibiotics, and/or does not want to follow up with [facility] ID, would treat him with Bactrim and have him follow up with [facility] ID.    This patient requires outpatient IV antibiotics.  Continue the patient's IV Daptomycin through at least 9/20 (date) for a total of 6 weeks (from 8/10 through 9/20) for the patient's ID diagnosis of MRSA spinal hardware infection.        Please complete the following prior to discharge:  1. Enroll the patient in the OPAT (Outpatient Parenteral Antibiotic Therapy) program through the EPIC inpatient OPAT  orderset (enter "OPAT" in ordersets)  2. Please order weekly labs (CBC w/ diff, CMP, ESR, CRP, CPK) to be faxed to the patient's OPAT ID physician at [phone]     Recommendations discussed with the primary team.    RBernerd Pho DO MPH  Infectious Disease Fellow, PGY-4    Patient discussed with Dr. WLake Bells     Attending Attestation    I have seen and examined the patient with Dr. SErby Pian  I have reviewed the relevant laboratory and imaging data.  I have reviewed the patient's medications.  I agree with the history, physical, assessment and plan as discussed with Dr. SErby Pian      43yo man with substance use disorder and homelessness with recent spinal fusion complicated by post-op wound dehiscence and presumed hardware infection at OSH due to MRSA.  Unable to remove hardware at this time.  Given his preferences and living situation, would recommend treating him with 6 weeks of IV daptomycin to be given at the infusion center.  Ideally would do this through a PIV and not a PICC line.  He will need OPAT and ID clinic follow up as outlined above.  He will likely need chronic suppression following his course of  daptomycin.    Currie Paris, MD, MS  Infectious Diseases Attending

## 2019-06-22 NOTE — Interdisciplinary (Addendum)
Per primary team, patient will be referred to OPAT for qdaily dapto admin. Referred medical student to pharmacist to assist with [place] 0175:  Tasked CM Concierge Scheduler to assist with PCP for OPAT.  Tiajuana Amass is out of Conservation officer, historic buildings.

## 2019-06-22 NOTE — Discharge Summary (Signed)
Date of Admission:  06/20/2019  Date of Discharge:  06/23/2019    Patient Name:  Brian Ritter    Principal Diagnosis (required):  Wound infection      Hospital Problem List (required):  Active Hospital Problems    Diagnosis   . *Wound infection with MRSA [T14.8XXA, L08.9]   . Ankylosing spondylitis (CMS-HCC) [M45.9]      Resolved Hospital Problems   No resolved problems to display.     Additional Hospital Diagnoses ("rule out" or "suspected" diagnoses, etc.):     Hardware dislodgement  Compression fracture  Cauda equina syndrome    Principal Procedure During This Hospitalization (required):  MRI L-spine, T-spine with IV contrast 06/20/19    Other Procedures Performed During This Hospitalization (required):  PICC placement 06/22/19    MRI L-spine, T-spine with IV contrast 06/20/19  Redemonstrated postoperative changes of laminectomy and posterior fusion with paired paraspinal rods from T8 to L1 and transpedicular screws bilaterally at all levels except at T10 on the right. Susceptibility artifact from the hardware degrades evaluation of the central spinal canal and spinal cord at the fused levels. Within these limitations, there is no definite abnormal intrathecal enhancement in the thoracic spine.    An irregular fluid collection is noted in the midline subcutaneous tissues extending from T5 to L3 (approximate total craniocaudal extent of 27 cm). The collection measures up to 4.4 cm in maximal transverse diameter posterior to T10, and up to 1.5 cm in maximal anteroposterior extent posterior to L3. There is no visible continuity with the epidural space or central spinal canal. There is edema of the surrounding subcutaneous tissues.    There are fatty endplate changes at L4-5. The marrow signal is otherwise unremarkable without evidence of edema, fracture, or suspicious lesion.    There is mild straightening of the lumbar lordosis. Spinal alignment is otherwise unremarkable.    There is no visible abnormal spinal  cord signal. The conus medullaris terminates at L1-2. There is enhancement of the ventral cauda equina nerve roots. There is no definite debris or    In the thoracic spine, there is mild multilevel degenerative disc disease but, within the limitations of susceptibility artifact related to the patient's hardware, the spinal canal is grossly patent at all levels without evidence of high grade stenosis. No high grade neural foraminal narrowing is noted.    In the lumbar spine, there is multilevel degenerative disc disease, epidural lipomatosis, and facet hypertrophy without high grade central spinal canal stenosis at any level.    CONCURRENT SUPERVISION:  I have reviewed the images and agree with the Resident's interpretation.     IMPRESSION:  1. Enhancement of the ventral cauda equina nerve roots, nonspecific. This may be related to thecal sac violation, such as with lumbar puncture. Please correlate with recent procedural history. If there has been no recent instrumentation, intrathecal infection cannot be excluded. Reassuringly, no definite purulent material or debris layering within the dependent portion of the thecal sac is noted. Other differential considerations for nerve root enhancement could include inflammatory processes such as Guillain-Barre.    2. Redemonstrated postoperative changes of posterior decompression and instrumented fusion from T8-L1. Fluid collection in the midline subcutaneous tissues extending from T5 to L3 (approximate craniocaudal extent of 27 cm) without definite extension to the epidural space or central spinal canal. This could represent either a sterile or infected postoperative fluid collection, which cannot be distinguished based on imaging features. If there is concern for infection, fluid sampling should be performed.  3. No abnormal intrathecal enhancement within the thoracic spine.    4. Multilevel degenerative changes without high-grade central spinal canal stenosis in  either the thoracic or lumbar spine, within the limitations of metallic artifact from the patient's spinal fusion hardware. Recommend uploading prior images for comparison of interval change.    CT L-spine, T-spine with IV contrast 06/20/19:  There is no acute fracture or subluxation. The vertebral body heights are maintained. Spinal alignment is preserved. The patient is status post posterior fusion from a T 8 two L1 with placement of vertical rods and pedicle screws bilateral at all levels except for an absent right screw at the T10 level. The right T11 pedicle screws appear to extend medial to the pedicle and partially in the spinal canal. T9, T12 and L1 posterior laminectomies are present    Disc space narrowing is present at L1. Disc space narrowing vacuum disc phenomenon is also present at the L1-2, L3-4, L4-5 and L5-S1.  Metallic artifact from the where making evaluation of the soft tissues difficult at the level of the fusion. Postoperative changes with soft tissue swelling is present.    Consultations Obtained During This Hospitalization:  Ortho spine  Infectious disease    Key consultant recommendations:  Ortho spine:   - No urgent indication for surgical intervention  - Antibiotics per primary team/ID    Infectious disease:  - 6 weeks IV daptomycin for presumed vertebral osteo and hardware infection due to MRSA    Reason for Admission to the Hospital / History of Present Illness:  Brian Dallasravis Oltmann is a 43 year old male with hx of ankylosing spondylitis, Ehler's Danlos, autism, and recent spinal fusion surgery c/b postop seroma with MRSA presenting with 1 day history of bilateral leg numbness and weakness. He was driving with his partner in their [place] [facility] and suddenly developed bilateral leg numbness and weakness 1 day prior to presentation. Denies recent leg trauma, LOC, palpitations, CP, SOB. Reports that he can ambulate currently but needs to take "smaller and slower" steps.      Of note, he had trauma to his back around June 2020. Had recent surgery of disc herniation s/p thoracic laminectomy, resection of herniated discs, fusion 7/13/20at OSH. Course c/b MRSA infectedSeroma postop thoracolumbar spine fusion s/p drainage and debridement 06/13/19. Cyto was daptomycin sensitive so started on daptomycin with plan of 6 weeks therapy until 9/14 and possible oral suppression at least 6 months. Patient left AMA 3 days prior. No PICC line was in place. Has not been taking daptomycin for 3 days.     ROS:   Gen- denies F/C, N/V  CP- denies CP, SOB, palpitations, cough, wheezing  Abd- denies abdominal pain, changes in BM  Neuro- as above    ER course summary:   Vitals- 98.61F, 106HR, 18RR, 127/82, 98% O2  Pain meds- dilaudid 1 mg IV, ativan 0.5 mg, IV morphine total 18 mg  XR T-spine and CT T-spine and L-spine done    Hospital Course by Problem (required):  On presentation to ED, was found to be afrebile, normotensive, with reassuring CXR. Ortho spine and infectious disease were consulted, and CT spine showed no contrast-enhancing fluid collection, new compression fracture, or other finding requiring urgent surgical intervention. After receiving one dose of daptomycin in ED, he experienced rash on arm near IV site and chest tightness, although he states that he has not had adverse reactions to multiple doses of daptomycin in the past. He was treated  with IV benadryl, and rash resolved. MRI spine redemonstrated postop changes of prior decompression and fusion from T8-L1 with T5-L3 fluid collection, but over the course of hospitalization lower extremity neurological symptoms improved to baseline, so washout of MRSA infected seroma was deferred. On the last day of admission, Mr. Gintz endorsed pain exacerbation, but pain quickly resolved and repeat CRP was only mildly elevated to 0.59. Given recent spinal surgery and hardware, Mr. Ohms was considered high risk for infection and ID recommended 6  weeks IV abx outpatient. Since Mr. Trapani stated that he was unlikely to attend bidaily infusions, qday daptomycin was preferred over BID vancomycin and he was discharged with f/u at outpatient infusion clinic for 6 weeks daptomycin, as well as f/u with PCP and ortho surgery clinic. ID also recommended chronic suppression therapy s/p completion of IV dapto, so plan to f/u with ID as outpatient.    Tests Outstanding at Discharge Requiring Follow Up:  Blood cultures x2 06/20/19  2  Discharge Condition (required):  Stable    Key Physical Exam Findings at Discharge:  Mental Status Exam: Patient is alert and oriented to person, place, time, and situation.  Physical examination is significant for:   .Right greater than left lower extremity weakness and numbness which is baseline for him    Discharge Diet:  Regular.    Discharge Medications:     What To Do With Your Medications      START taking these medications      Add'l Info   acetaminophen 325 MG tablet  Commonly known as: TYLENOL  Take 2 tablets (650 mg) by mouth every 8 hours as needed for Mild Pain (Pain Score 1-3) for up to 10 days.   Quantity: 60 tablet  Refills: 0     DAPTOmycin (CUBICIN) 600 mg in sodium chloride 100 mL IVPB  Inject 600 mg into vein every 24 hours Indications: MRSA seroma.   Refills: 0     oxyCODONE 5 MG immediate release tablet  Commonly known as: ROXICODONE  Take 1 tablet (5 mg) by mouth every 4 hours as needed for Moderate Pain (Pain Score 4-6) or Severe Pain (Pain Score 7-10) for up to 5 days.   Quantity: 20 tablet  Refills: 0           Where to Get Your Medications      These medications were sent to [facility]  [address] [place], [place] [address]    Hours: Mon-Fri: 8:30am-7:00pm; Sat-Sun & Holidays: 9:00am-5:00pm Phone: [phone]    acetaminophen 325 MG tablet   oxyCODONE 5 MG immediate release tablet     Information about where to get these medications is not yet available    Ask your nurse  or doctor about these medications   DAPTOmycin (CUBICIN) 600 mg in sodium chloride 100 mL IVPB         Allergies:  Allergies   Allergen Reactions   . Daptomycin Hives   . Wound Dressing Adhesive Hives       Discharge Disposition:  Home.    Discharge Code Status:  Full code / full care  This code status is not changed from the time of admission.    Follow Up Appointments:    Scheduled appointments:  No future appointments.    For appointments requested for after discharge that have not yet been scheduled, refer to the Post Discharge Referrals section of the After Visit Summary.    Discharging Physician's Contact  Information:  [facility] phone triage at [phone].

## 2019-06-22 NOTE — Progress Notes (Signed)
[facility] Hospital day:   2 days - Admitted on: 06/20/2019    Brian Ritter is a 43 year old male with pertinent history of ankylosing spondylitis, Ehlers-Danlos, autism, and recent spinal fusion surgery at OSH c/b postop MRSA seroma who presented with bilateral LE numbness, weakness, and pain, now improving.    24 Hour - Interval Events   Became angry and wanted to leave AMA this morning when he realized that he was NPO, but was calm after receiving late breakfast    Subjective   -States that bilateral LE weakness, numbness, and pain resolving, now close to baseline  - States that he is likely to miss infusion appointments if scheduled in [facility]     Physical Exam   Temp  Min: 96.2 F (35.7 C)  Max: 98.3 F (36.8 C)  Pulse  Min: 78  Max: 94  BP  Min: 98/59  Max: 132/57  Resp  Min: 16  Max: 18  SpO2  Min: 96 %  Max: 100 %    BP 132/57 (BP Location: Left arm, BP Patient Position: Semi-Fowlers)   Pulse 92   Temp 96.2 F (35.7 C)   Resp 16   Ht 5' 7"  (1.702 m)   Wt 76.9 kg (169 lb 8.5 oz)   SpO2 96%   BMI 26.55 kg/m  O2 Device: None (Room air)      08/11 0600 - 08/12 0559  In: 743 [P.O.:740; I.V.:3]  Out: -   Urine x 0 Stool x 0 Emesis x 0     General:  WDWN, in no acute distress, resting comfortably in hospital bed  Extremities:  Warm and well perfused, no edema appreciated bilaterally  Vascular: Regular rate and volume of dorsalis pedis pulses bilaterally  Neuro:  Decreased sensation over RLE    Strength  R/L5/5  Hip flexion 4/5  Plantarflexion 4/5  Dorsiflexion 4/5    Pertinent Labs and Imaging and Meds     WBC 4.9 (08/12) HGB 12.7* (08/12) PLT 208 (08/12)    HCT 40.3 (08/12)      Na 136 (08/12) CL 97* (08/12) BUN 26* (08/12) GLU   94 (08/12)   K 4.1 (08/12) CO2 27 (08/12) Cr 1.15 (08/12)      TP 7.2 (08/12) ALT 112* (08/12) TBILI 0.30 (08/12) ALK PHOS  164* (08/12)   ALB 3.5 (08/12) AST 91* (08/12) DBILI <0.2 (08/12)      PT 11.8  INR 1.0    Scheduled Medications  . hydrOXYzine  HCL  25 mg Once   . sodium chloride  10 mL Q8H   . sodium chloride  3 mL Q8H     Continuous Medications  . sodium chloride       PRN Medications  . acetaminophen  975 mg Q8H PRN   . heparin  100 Units Once PRN   . heparin  100 Units Once PRN   . lidocaine  5 mL Once PRN   . nalOXone  0.1 mg Q2 Min PRN   . oxyCODONE  2.5 mg Q4H PRN   . oxyCODONE  2.5 mg Q4H PRN   . oxyCODONE  5 mg Q4H PRN   . sodium chloride  10 mL PRN   . sodium chloride  3 mL PRN   . sodium chloride   Continuous PRN     CT L-spine, T-spine with IV contrast 06/20/19:  Status post T8-L1 laminectomy. The the right T11 pedicle screws  extend medial to the pedicle into the spinal canal. Evaluation of the soft tissues is limited due to artifact from hardware. Multilevel degenerative changes are present.    Assessment / Plan   Brian Ritter is a 43 year old male with pertinent history of ankylosing spondylitis, Ehlers-Danlos, autism, and recent spinal fusion surgery at OSH c/b postop MRSA seroma who presented with bilateral LE numbness, weakness, and pain, now improving.    #Post-op seroma c/b MRSA infection  MRSA infection and seroma most likely result of post-op complications, s/p daptomycin but developed rash near IV line after administration. Per ID, Mr. Remo's recent spinal surgery and hardware make him high risk for infection, so will require 6 weeks IV abx. Since Mr. Kuenzel is likely to miss infusion appointments with BID abx, however, will opt for qday daptomycin with observation of next dose inpatient.  - ID and ortho spine consulted, following recs  - MRI spine completed, final recs pending results  - PICC  - Set up 6 weeks vancomycin OPAT    #Bilateral lower extremity weakness, numbness  Iimproving, no evidence of contrast enhancing fluid collection on CT spine, but MRI better modality for imaging soft tissue and spinal cord. Ortho spine read MRI and feel surgery unwarranted but pending official MRI read for final recs  - F/u MRI spine final  results  - Per ortho, f/u ortho surgery clinic 1-2 weeks post-discharge for wound check and suture removal (OSH vs [facility], per patient preferences)    # Hep C  Per Care Everywhere, Hep C Ab+ and aware of his diagnosis but no evidence of treatment.   - F/u treatment outpatient     Discharge planning: home health antibiotics  Foley: No foley catheter  VTE prophylaxis: lovenox  Diet: Diet Regular  Last BM: yesterday  IV fluids: No IV fluids  Access: PIVs, accessing PICC  Code status: Full Code    Patient care was discussed in detail with Farrel Conners, MD.  Note written by [name], MS4

## 2019-06-22 NOTE — Interdisciplinary (Addendum)
Patient again this morning is verbally abusive toward staffs, yelling, screaming, cursing at doctor and nursing staffs. Redirect patient that it is not right and professional to talk and treat staffs like that. He threw cups and other objects on the floor.

## 2019-06-22 NOTE — Interdisciplinary (Signed)
06/22/19 1401   Discharge Planning Needs   Does this patient have CM discharge planning needs? Yes   CM Needs Met? Yes   Discharge Transportation   Transportation * Patient Declined   Referrals/Resources   Community Referrals Pt is sch on 8/24 at 940am w/ Dr. Peter Minium [facility] ([place], [place], [phone]). Pt aware that ins may not be contracted but will try to transfer.

## 2019-06-22 NOTE — Procedures (Signed)
Brian Ritter is a 43 year old male patient.    ICD-10-CM ICD-9-CM   1. Midline thoracic back pain, unspecified chronicity  M54.6 724.1   2. Wound infection with MRSA  T14.8XXA 958.3    L08.9      No past medical history on file.  Blood pressure 132/57, pulse 92, temperature 96.2 F (35.7 C), resp. rate 16, height 5\' 7"  (1.702 m), weight 76.9 kg (169 lb 8.5 oz), SpO2 96 %.    PICC Placement      Date/Time: 06/22/2019 9:30 AM      Universal Protocol  Consent: Verbal consent obtained. Written consent obtained.  Risks and benefits: risks, benefits and alternatives were discussed  Consent given by: patient  Patient understanding: patient states understanding of the procedure being performed  Patient consent: the patient's understanding of the procedure matches consent given  Procedure consent: procedure consent matches procedure scheduled  Relevant documents: relevant documents present and verified  Test results: test results available and properly labeled  Site marked: the operative site was marked  Imaging studies: imaging studies available  Patient identity confirmed: verbally with patient and arm band  Time out: Immediately prior to procedure a "time out" was called to verify the correct patient, procedure, equipment, support staff and site/side marked as required.      Indications and Staff  Indications: prolonged antibiotic therapy  Insertion location/unit: [facility]  Reason for insertion: new indication      Performed by: Audelia Acton, RN  Attending present: No    Procedure Details  Anesthesia: local infiltration  Local Anesthetic: lidocaine 1% without epinephrine  Anesthetic total: 5 mL  Patient was not sedated  Skin prep used?: chlorhexidine gluconate  Skin prep dry before first puncture: Yes      Patient position: supine  Insertion side: right   Insertion site: basilic    Catheter type: PICC  Tunneled: No   Catheter lumen: single lumen    Is this line for Dialysis: No  Brand (adult):  Bard    Lot #: [id number]    Power rated for IV contrast: Yes  Antimicrobial catheter used: No  Ultrasound guidance: yes  Sterile ultrasound technique: sterile gel and sterile probe covers were used.  Indications for ultrasound: safety      Ultrasound guided placement steps: vessel located, needle entry and guide wire removed  Sterile precautions used: sterile gown, cap, mask/eye shield, sterile gloves, head to toe drape on patient and hand hygiene  Sterile field maintained during procedure: Yes  Sterile technique maintained during procedure and dressing application: Yes  Catheter securement: non-suture securement device    Post Procedure  Post-procedure: dressing applied and antimicrobial patch applied  Estimated Blood Loss: 5 ml  Assessment: blood return through all ports, placement verified by [place] and placement verified by x-ray  Complications: none    Follow up: portable chest xray ordered and X-ray done, line in place, OK to use  Number of puncture attempts: 1  Line placement successful: Yes  Catheter tip location: SVC  Final length internal catheter: 36  Final length external catheter (cm): 0  Patient tolerance: patient tolerated the procedure well with no immediate complications      Comments: 54/5 R Basilic                           Audelia Acton, RN  06/22/2019

## 2019-06-22 NOTE — Progress Notes (Addendum)
ORTHOPAEDIC SURGERY PROGRESS NOTE  06/22/19    Consulting Service: Medicine ([facility])    CC: leg numbness with possible SSI of spine    Events/Subjective:  NAEO. MRI obtained yesterday. Says he feels better than admission - he is able to walk better and has significant improvement in LLE numbness. Denies bowel or bladder dysfunction    Physical Exam:  BP 132/57 (BP Location: Left arm, BP Patient Position: Semi-Fowlers)   Pulse 92   Temp 96.2 F (35.7 C)   Resp 16   Ht 5' 7"  (1.702 m)   Wt 76.9 kg (169 lb 8.5 oz)   SpO2 96%   BMI 26.55 kg/m       General: no acute distress  Resp: no labored breathing  CV: RRR via peripheral pulses    Musculoskeletal:  RLE:   No low back with SLR, but reports diffuse shooting sensation  IP Quad  Hams  TA   EHL GSC  FHL   4/5 5/5  5/5  5-/5  5-/5  5-/5 5-/5     Sensory: subjective diminished sensation L2-S1 but responds to noxious stimuli   Vascular: palpable PT/DP pulses, cap refill < 2 seconds, WWP   Reflexes: 2+ patellar, 1+ achilles   3 beats clonus, Babinski equivocal    LLE:   No low back with SLR  IP Quad  Hams  TA   EHL GSC  FHL   4/5 4+/5  5/5  5/5  5/5  5/5 5/5    Motor exam was variable secondary to variable effort.   Sensory:  Endorses loss of sensation L2-S1 has improved but not quite at baseline   Vascular: palpable PT/DP pulses, cap refill < 2 seconds, WWP   Reflexes: 2+ patellar, 1+ achilles   2 beats clonus, Babinski equivocal    Spine:   Lumbar: Incision looks well approximated with a running nylon stitch.  There is some maceration and dried serous discharge on the inferior aspect of incision.  There is a small unilateral area of edema to the left of this inferior part of the incision, but no erythema or appreciable fluctuance.  No expressible drainage.  Mildly tender to palpation.          Labs:  Lab Results   Component Value Date    NA 136 06/22/2019    K 4.1 06/22/2019    CL 97 (L) 06/22/2019    BICARB 27 06/22/2019    BUN 26 (H) 06/22/2019    CREAT  1.15 06/22/2019    GLU 94 06/22/2019    [place] 8.7 06/22/2019     Lab Results   Component Value Date    WBC 4.9 06/22/2019    HGB 12.7 (L) 06/22/2019    HCT 40.3 06/22/2019    PLT 208 06/22/2019     Lab Results   Component Value Date    INR 1.0 06/22/2019     Lab Results   Component Value Date/Time    CRP 0.21 06/20/2019 05:29 AM     Lab Results   Component Value Date/Time    ESR 51 (H) 06/20/2019 05:29 AM       Imaging:  MRI pending final read but on my read no significant canal narrowing or hardware complication.     Assessment:  Brian Ritter is a 43 year old male who presents with new, acute onset bilateral lower extremity nonmyotomal weakness and nondermatomal sensation loss in the setting of recent spine I+D on 8/3 after which he refused  recommended outpatient IV abx.  LLE sensation and bilateral strength improving compared to prior exam. Area of peri-incisional swelling demonstrates no superficial gross evidence of infection.  Likely suture granuloma.     Plan/Recs:  - Treatment Plan:  No urgent indication for surgical intervention.  Final recommendations pending MRI final read  - Antibiotics per primary team/ID  - Activity:  Activity as tolerated with assistance  - Immobilization:  Brace when out of bed.  - VTE ppx:  Per primary  - Antibiotics:  Per primary  - Pain control: multimodal pain control with PO meds, minimize IV   - Imaging:  MRI of the T and L spine pending final read  - Diet:  Per primary    Dispo/Follow up plan:  Pending MRI final read. Follow up with surgeon at outside hospital within 1-2 weeks for wound check, suture removal. If patient prefers to follow up here, please feel free to order spine clinic appointment     Delle Reining. Ander Slade, MD, [facility]  Orthopaedic Surgery Resident      Addendum: Final read MRI reviewed. Sequelae of recent surgeries at outside hospital with no significant stenosis. Plan as above. Patient improving clinically. To be managed nonoperatively with follow up with outside surgeon  in 1-2 weeks. Please page spine if patient clinically deteriorates.     D/w fellow Dr. Beryle Flock. Attending of record Dr. Lelon Huh.    Please page the Orthopaedic Spine Surgery Team with questions or concerns based on patient location:     Established spine floor patients at [facility]: Ortho Spine 1 - [phone]      Established spine floor patients at [facility]: Ortho Spine 2 - [phone]      New spine consultations not yet followed by spine team:    [facility] on call for "SPINE CONSULT" call schedule (Orthopaedic Spine versus Neurosurgery)   If Orthopaedic Spine is on call please page the [place] on call:   [place]: ORTHOPEDICS/TH   [facility]: ORTHOPEDICS/HC

## 2019-06-23 ENCOUNTER — Other Ambulatory Visit: Payer: Self-pay

## 2019-06-23 LAB — CBC WITH DIFF, BLOOD
ANC-Automated: 2.6 10*3/uL (ref 1.6–7.0)
Abs Basophils: 0 10*3/uL (ref <0.1)
Abs Eosinophils: 0.3 10*3/uL (ref 0.1–0.5)
Abs Lymphs: 1.6 10*3/uL (ref 0.8–3.1)
Abs Monos: 0.7 10*3/uL (ref 0.2–0.8)
Basophils: 0 %
Eosinophils: 6 %
Hct: 38.1 % — ABNORMAL LOW (ref 40.0–50.0)
Hgb: 12 gm/dL — ABNORMAL LOW (ref 13.7–17.5)
Lymphocytes: 31 %
MCH: 29.1 pg (ref 26.0–32.0)
MCHC: 31.5 g/dL — ABNORMAL LOW (ref 32.0–36.0)
MCV: 92.5 um3 (ref 79.0–95.0)
MPV: 9.3 fL — ABNORMAL LOW (ref 9.4–12.4)
Monocytes: 14 %
Plt Count: 185 10*3/uL (ref 140–370)
RBC: 4.12 10*6/uL — ABNORMAL LOW (ref 4.60–6.10)
RDW: 15.2 % — ABNORMAL HIGH (ref 12.0–14.0)
Segs: 49 %
WBC: 5.2 10*3/uL (ref 4.0–10.0)

## 2019-06-23 LAB — BASIC METABOLIC PANEL, BLOOD
Anion Gap: 10 mmol/L (ref 7–15)
BUN: 15 mg/dL (ref 6–20)
Bicarbonate: 25 mmol/L (ref 22–29)
Calcium: 8.6 mg/dL (ref 8.5–10.6)
Chloride: 101 mmol/L (ref 98–107)
Creatinine: 0.93 mg/dL (ref 0.67–1.17)
GFR: >60 mL/min
Glucose: 97 mg/dL (ref 70–99)
Potassium: 3.8 mmol/L (ref 3.5–5.1)
Sodium: 136 mmol/L (ref 136–145)

## 2019-06-23 LAB — LIVER PANEL, BLOOD
ALT (SGPT): 105 U/L — ABNORMAL HIGH (ref 0–41)
AST (SGOT): 78 U/L — ABNORMAL HIGH (ref 0–40)
Albumin: 3.5 g/dL (ref 3.5–5.2)
Alkaline Phos: 144 U/L — ABNORMAL HIGH (ref 40–129)
Bilirubin, Dir: <0.2 mg/dL (ref <0.2)
Bilirubin, Tot: 0.3 mg/dL (ref <1.2)
Total Protein: 7.1 g/dL (ref 6.0–8.0)

## 2019-06-23 LAB — PHOSPHORUS, BLOOD: Phosphorous: 3.9 mg/dL (ref 2.7–4.5)

## 2019-06-23 LAB — C-REACTIVE PROTEIN, BLOOD: CRP: 0.59 mg/dL — ABNORMAL HIGH (ref <0.5)

## 2019-06-23 LAB — MAGNESIUM, BLOOD: Magnesium: 1.9 mg/dL (ref 1.6–2.6)

## 2019-06-23 MED ORDER — OXYCODONE HCL 5 MG OR TABS
5.0000 mg | ORAL_TABLET | ORAL | 0 refills | Status: DC | PRN
Start: 2019-06-23 — End: 2019-07-25
  Filled 2019-06-23: qty 20, 4d supply, fill #0

## 2019-06-23 MED ORDER — ACETAMINOPHEN 325 MG PO TABS
650.0000 mg | ORAL_TABLET | Freq: Three times a day (TID) | ORAL | 0 refills | Status: DC | PRN
Start: 2019-06-23 — End: 2019-07-25
  Filled 2019-06-23: qty 60, 10d supply, fill #0

## 2019-06-23 MED ORDER — SODIUM CHLORIDE 0.9 % IV SOLN
8.0000 mg/kg | INTRAVENOUS | Status: DC
Start: 2019-06-23 — End: 2019-07-25

## 2019-06-23 NOTE — Plan of Care (Signed)
Problem: Promotion of Health and Safety  Goal: Promotion of Health and Safety  Description: The patient remains safe, receives appropriate treatment and achieves optimal outcomes (physically, psychosocially, and spiritually) within the limitations of the disease process by discharge.    Information below is the current care plan.  Outcome: Progressing  Flowsheets  Taken 06/23/2019 1457 by Tawni Carnes, RN  Guidelines: Inpatient Nursing Guidelines  Outcome Evaluation (rationale for progressing/not progressing) every shift: Patient dishcarged today, left with PICC line in place to continue Daptomycin therapy outpatient. No acute distress during shift, will contiune to monitor.  Taken 06/23/2019 0730 by Tawni Carnes, RN  Patient /Family stated Goal: go home today  Taken 06/23/2019 0037 by Izetta Dakin, RN  Individualized Interventions/Recommendations #1: Oxycodone administered for pain control  Taken 06/22/2019 0113 by Izetta Dakin, RN  Individualized Interventions/Recommendations #2 (if applicable): Continue to asses for sign and symptoms of infection to the back incision  Taken 06/21/2019 0826 by Verdie Drown, RN  Individualized Interventions/Recommendations #5 (if applicable): reinforce patient on wearing back brace when OOB, patient refuse

## 2019-06-23 NOTE — Interdisciplinary (Signed)
Patient's PICC line was maintained for antibiotic infusion while outpatient.  Patient was given discharge paperwork and verbalized understanding before signing paperwork. Patient will go to pharmacy to pick up medication. Patient left the unit, no acute distress noted, AAOx4 with a steady gait.

## 2019-06-23 NOTE — Discharge Instructions (Signed)
Diagnosis and Reason for Admission    You were admitted to the hospital for the following reason(s):  Infection near your surgical incision in your back.    Your full diagnosis list is located on this After Visit Summary in the Hospital Problems section.    What [facility] Stay    The main tests and treatments done for you during this hospitalization were:      1.  You were seen by the Orthopedic Spine surgeons.  They determined that surgical drainage of your infection was not necessary.    2.  You had an MRI of your spine.  This showed the fluid collection under the incision that was already known to be there; the Orthopedic Spine service thought that this looked fine and could be managed with intravenous antibiotics alone.    3.  You had a PICC line placed, in order to receive IV antibiotics once out of the hospital.    The following evaluation is still important to complete after discharge from the hospital:    1.  You have follow up arranged in the [facility] to get IV daptomycin (an antibiotic) once a day; you will need to come to the [facility] each day to get that infusion.  These daily infusions will happen through September 20th.  Once each week, you will have labs drawn during your infusion visit; these lab results will be faxed to the Infectious Disease clinic (see next item) so that they can make sure everything is going well.    2.  You have a referral to the Infectious Disease clinic that deals with outpatient IV antibiotics.  Please call that clinic using the number on the discharge instructions and make an appointment.    3.  You have a referral to primary care at [facility].  This will be important to establish regular outpatient care for your ankylosing spondylitis.  Your new primary care provider can then provide a referral for you to see a rheumatologist at [facility].    Instructions for After Discharge    Your diet at home should be a regular diet.    Your  activity level at home should be:  as much exercise or activity as you can tolerate.    Specific activity restrictions:    None    Wound or tube care instructions:  Keep area clean and dry.    Your medication list is located on this After Visit Summary in the Current Discharge Medication List section.  Your nurse will review this information with you before you leave the hospital.    It is very important for you to keep a current medication list with you in order to assist your doctors with your medical care.  Bring this After Visit Summary with you to your follow up appointments.    Reasons to Contact a Doctor Urgently    Call 911 or return to the hospital immediately if you develop high fevers, if there is substantial drainage from your incision or redness around your incision, or if you have substantial new symptoms of weakness or numbness in the legs.    Your hospital physician at the time of hospital discharge is Victorino Dike.    Your physicians strive to explain things in a way you can understand.  If you have any questions or concerns about your hospital care or your medications, your hospital physician can be contacted in the following manner:  [facility] phone triage  at [phone].    New problems and symptoms unrelated to your hospitalization are best addressed by your primary outpatient physician (PCP), as are requests for medication refills or appointment referrals after hospital discharge.  However, if new issues arise before you can see or contact your PCP, your hospital physician may be able to assist with these issues during this time.    What Needs to Happen Next After Discharge -- Appointments and Follow Up    Any appointments already scheduled at [facility] clinics will be listed in the Future Appointments section at the top of this After Visit Summary.      Sometimes tests performed in the hospital do not yet have results by the time a patient goes home.  The following key tests will need  to be followed up at your next appointment: None    Medical Home Information    Your primary care provider or clinic currently on file at [facility] is: [facility]  You currently have an advance directive or living will on file at [facility]: Yes    Handouts Given to You (if applicable)

## 2019-06-23 NOTE — Progress Notes (Signed)
ORTHOPAEDIC SURGERY PROGRESS NOTE  06/23/19    Consulting Service: Medicine ([facility])    CC: leg numbness with possible SSI of spine    Events/Subjective:  NAEO. Resting in bed comfortably using iPad. Says he is feeling much better: RLE strength and sensation back to baseline, LLE strength and sensation essentially normal. Says back pain is increased today.     Physical Exam:  BP 101/53 (BP Location: Left arm, BP Patient Position: Semi-Fowlers)   Pulse 70   Temp 98.3 F (36.8 C)   Resp 16   Ht 5' 7"  (1.702 m)   Wt 76.9 kg (169 lb 8.5 oz)   SpO2 96%   BMI 26.55 kg/m       General: no acute distress  Resp: no labored breathing  CV: RRR via peripheral pulses    Musculoskeletal:  RLE:   No low back with SLR, but reports diffuse shooting sensation  IP Quad  Hams  TA   EHL GSC  FHL   4/5 5/5  5/5  5-/5  5-/5  5-/5 5-/5     Sensory: subjective diminished sensation L2-S1 but responds to noxious stimuli   Vascular: palpable PT/DP pulses, cap refill < 2 seconds, WWP   Reflexes: 2+ patellar, 1+ achilles   3 beats clonus, Babinski equivocal    LLE:   No low back with SLR  IP Quad  Hams  TA   EHL GSC  FHL   4/5 4+/5  5/5  5/5  5/5  5/5 5/5    Motor exam was variable secondary to variable effort.   Sensory:  Endorses loss of sensation L2-S1 has improved and almost at baseline   Vascular: palpable PT/DP pulses, cap refill < 2 seconds, WWP   Reflexes: 2+ patellar, 1+ achilles   2 beats clonus, Babinski equivocal    Spine:   Lumbar: Incision looks well approximated with a running nylon stitch.  There is some maceration and dried serous discharge on the inferior aspect of incision.  There is a small unilateral area of edema to the left of this inferior part of the incision, but no appreciable purulence or discharge.  Mildly tender to palpation.          Labs:  Lab Results   Component Value Date    NA 136 06/23/2019    K 3.8 06/23/2019    CL 101 06/23/2019    BICARB 25 06/23/2019    BUN 15 06/23/2019    CREAT 0.93  06/23/2019    GLU 97 06/23/2019    [place] 8.6 06/23/2019     Lab Results   Component Value Date    WBC 5.2 06/23/2019    HGB 12.0 (L) 06/23/2019    HCT 38.1 (L) 06/23/2019    PLT 185 06/23/2019     Lab Results   Component Value Date    INR 1.0 06/22/2019     Lab Results   Component Value Date/Time    CRP 0.21 06/20/2019 05:29 AM     Lab Results   Component Value Date/Time    ESR 51 (H) 06/20/2019 05:29 AM       Imaging:  MRI on my read no significant canal narrowing or hardware complication, expected postop seroma superficially.     Assessment:  Brian Ritter is a 43 year old male who presents with new, acute onset bilateral lower extremity nonmyotomal weakness and nondermatomal sensation loss in the setting of recent spine I+D on 8/3 after which he refused recommended outpatient  IV abx.  LLE sensation and bilateral strength improving compared to prior exam. Area of peri-incisional swelling demonstrates no superficial gross evidence of infection.     Paged by primary team due to increasing back pain. Patient with known post op seroma and no purulence on exam, although evidence of granulation tissue. Vitals and labs normal, including last CRP, but will repeat CRP this morning. Patient already on appropriate abx per ID team. Low suspicion for abscess requiring intervention. Patient otherwise improving clinically with strength and sensation essentially at baseline.    Plan/Recs:  - Treatment Plan:  No urgent indication for surgical intervention  - Antibiotics per primary team/ID  - Activity:  Activity as tolerated with assistance  - Immobilization:  Brace when out of bed.  - Pain control: multimodal pain control with PO meds, minimize IV   - F/u CRP (added on)    Dispo/Follow up plan:  Follow up with surgeon at outside hospital within 1-2 weeks for wound check, suture removal. Please make NPO and page spine if patient clinically deteriorates.     Please page the Orthopaedic Spine Surgery Team with questions or concerns  based on patient location:     Established spine floor patients at [place]: Ortho Spine 1 - [phone]      Established spine floor patients at [facility]: Ortho Spine 2 - [phone]      New spine consultations not yet followed by spine team:    [facility] on call for "SPINE CONSULT" call schedule (Orthopaedic Spine versus Neurosurgery)   If Orthopaedic Spine is on call please page the [facility] on call:   [place]: ORTHOPEDICS/TH   [facility]: ORTHOPEDICS/HC

## 2019-06-23 NOTE — Plan of Care (Signed)
Problem: Promotion of Health and Safety  Goal: Promotion of Health and Safety  Description: The patient remains safe, receives appropriate treatment and achieves optimal outcomes (physically, psychosocially, and spiritually) within the limitations of the disease process by discharge.    Information below is the current care plan.  Outcome: Progressing  Flowsheets  Taken 06/23/2019 0037 by Izetta Dakin, RN  Individualized Interventions/Recommendations #1: Oxycodone administered for pain control  Individualized Interventions/Recommendations #3 (if applicable): Started on Daptomycin q24, tolerate well, no hives/rash noted  Individualized Interventions/Recommendations #4 (if applicable): Educate and reinforce TLSO brace before getting OOB, pt non-complaint  Outcome Evaluation (rationale for progressing/not progressing) every shift: VSS, continue abt as ordered, prn oxycodone for pain with effective result, no further complaint on this shift, possible discharge in AM.  Taken 06/22/2019 2030 by Izetta Dakin, RN  Patient /Family stated Goal: Get antibiotics started and go home tomorrow   Taken 06/22/2019 0113 by Izetta Dakin, RN  Individualized Interventions/Recommendations #2 (if applicable): Continue to asses for sign and symptoms of infection to the back incision  Taken 06/21/2019 0346 by Frutoso Chase, RN  Guidelines: Inpatient Nursing Guidelines

## 2019-06-23 NOTE — Progress Notes (Signed)
I have evaluated the patient today; he will be discharged from the hospital today.     He has some pain in his back at the incision site but this is well controlled with medication.  Ortho Spine concurs that IV antibiotic therapy is sufficient management at this time.    Today, his physical examination is notable for some granulation tissue at the inferior edge of his spinal incision.    He will be discharged to home, with plans to come back to the [facility] in [place] daily for IV daptomycin through 07/31/19.    Please refer to the Discharge Summary for further details.    I spent 35 minutes on the patient's care unit in the preparation and execution of the hospital discharge.    Victorino Dike, MD  Health Sciences Clinical Professor of Medicine  Division of [facility] Medicine

## 2019-06-24 ENCOUNTER — Ambulatory Visit
Admission: RE | Admit: 2019-06-24 | Discharge: 2019-06-27 | Disposition: A | Discharge status: Home / Self Care | Payer: Medicaid Other | Attending: Internal Medicine | Admitting: Internal Medicine

## 2019-06-24 ENCOUNTER — Telehealth (HOSPITAL_BASED_OUTPATIENT_CLINIC_OR_DEPARTMENT_OTHER): Payer: Self-pay

## 2019-06-24 VITALS — BP 101/65 | HR 107 | Temp 97.1°F | Resp 17 | Ht 67.0 in | Wt 168.0 lb

## 2019-06-24 DIAGNOSIS — Z5189 Encounter for other specified aftercare: Secondary | ICD-10-CM

## 2019-06-24 DIAGNOSIS — T148XXA Other injury of unspecified body region, initial encounter: Secondary | ICD-10-CM | POA: Insufficient documentation

## 2019-06-24 DIAGNOSIS — L089 Local infection of the skin and subcutaneous tissue, unspecified: Secondary | ICD-10-CM | POA: Insufficient documentation

## 2019-06-24 LAB — CBC WITH DIFF, BLOOD
ANC-Automated: 3.4 10*3/uL (ref 1.6–7.0)
ANC-Instrument: 3.4 10*3/uL (ref 1.6–7.0)
Abs Basophils: 0 10*3/uL (ref <0.1)
Abs Eosinophils: 0.1 10*3/uL (ref 0.1–0.5)
Abs Lymphs: 1.8 10*3/uL (ref 0.8–3.1)
Abs Monos: 1 10*3/uL — ABNORMAL HIGH (ref 0.2–0.8)
Basophils: 0 %
Eosinophils: 2 %
Hct: 40.7 % (ref 40.0–50.0)
Hgb: 13.1 gm/dL — ABNORMAL LOW (ref 13.7–17.5)
Lymphocytes: 28 %
MCH: 29.8 pg (ref 26.0–32.0)
MCHC: 32.2 g/dL (ref 32.0–36.0)
MCV: 92.5 um3 (ref 79.0–95.0)
MPV: 9 fL — ABNORMAL LOW (ref 9.4–12.4)
Monocytes: 16 %
Plt Count: 211 10*3/uL (ref 140–370)
RBC: 4.4 10*6/uL — ABNORMAL LOW (ref 4.60–6.10)
RDW: 15.2 % — ABNORMAL HIGH (ref 12.0–14.0)
Segs: 54 %
WBC: 6.3 10*3/uL (ref 4.0–10.0)

## 2019-06-24 LAB — C-REACTIVE PROTEIN, BLOOD: CRP: 3.38 mg/dL — ABNORMAL HIGH (ref <0.5)

## 2019-06-24 LAB — BASIC METABOLIC PANEL, BLOOD
Anion Gap: 13 mmol/L (ref 7–15)
BUN: 12 mg/dL (ref 6–20)
Bicarbonate: 25 mmol/L (ref 22–29)
Calcium: 9.1 mg/dL (ref 8.5–10.6)
Chloride: 99 mmol/L (ref 98–107)
Creatinine: 0.93 mg/dL (ref 0.67–1.17)
GFR: >60 mL/min
Glucose: 106 mg/dL — ABNORMAL HIGH (ref 70–99)
Potassium: 3.8 mmol/L (ref 3.5–5.1)
Sodium: 137 mmol/L (ref 136–145)

## 2019-06-24 LAB — LIVER PANEL, BLOOD
ALT (SGPT): 112 U/L — ABNORMAL HIGH (ref 0–41)
AST (SGOT): 79 U/L — ABNORMAL HIGH (ref 0–40)
Albumin: 4 g/dL (ref 3.5–5.2)
Alkaline Phos: 144 U/L — ABNORMAL HIGH (ref 40–129)
Bilirubin, Dir: <0.2 mg/dL (ref <0.2)
Bilirubin, Tot: 0.7 mg/dL (ref <1.2)
Total Protein: 8.5 g/dL — ABNORMAL HIGH (ref 6.0–8.0)

## 2019-06-24 LAB — SED RATE, BLOOD: Sed Rate: 65 mm/hr — ABNORMAL HIGH (ref 0–15)

## 2019-06-24 LAB — CPK-CREATINE PHOSPHOKINASE, BLOOD: CPK: 203 U/L — ABNORMAL HIGH (ref 0–175)

## 2019-06-24 MED ORDER — HEPARIN SODIUM LOCK FLUSH 100 UNIT/ML IJ SOLN CUSTOM
300.0000 [IU] | INTRAVENOUS | Status: AC | PRN
Start: 2019-06-24 — End: 2019-06-24
  Administered 2019-06-24: 13:00:00 300 [IU] via INTRAVENOUS
  Filled 2019-06-24: qty 5

## 2019-06-24 MED ORDER — SODIUM CHLORIDE 0.9 % IV SOLN
INTRAVENOUS | Status: AC
Start: 2019-06-24 — End: 2019-06-24
  Administered 2019-06-24: 12:00:00 via INTRAVENOUS

## 2019-06-24 MED ORDER — SODIUM CHLORIDE 0.9 % IV SOLN
600.0000 mg | Freq: Once | INTRAVENOUS | Status: AC
Start: 2019-06-24 — End: 2019-06-24
  Administered 2019-06-24: 600 mg via INTRAVENOUS
  Filled 2019-06-24: qty 600

## 2019-06-24 MED ORDER — SODIUM CHLORIDE 0.9 % IJ SOLN (CUSTOM)
10.0000 mL | INTRAMUSCULAR | Status: AC | PRN
Start: 2019-06-24 — End: 2019-06-24
  Administered 2019-06-24: 13:00:00 10 mL via INTRAVENOUS

## 2019-06-24 NOTE — Interdisciplinary (Signed)
Non-Chemotherapy Infusion Nursing Note - [place] is a 43 year old male who presents for infusion of Daptomycin    Vitals:    06/24/19 1146 06/24/19 1215   BP:  101/65   BP Location:  Left arm   BP Patient Position:  Sitting   Pulse:  107   Resp:  17   Temp:  97.1 F (36.2 C)   TempSrc:  Temporal   SpO2:  100%   Weight: 76.2 kg (168 lb)    Height: 5\' 7"  (1.702 m)      Pain Score: 9  Body surface area is 1.9 meters squared.  Body mass index is 26.31 kg/m.    Pre-treatment nursing assessment:  No problems identified upon assessment. Pt feeling well. No reports of n/v/d/c/or SOB. VSS and pt in no apparent distress.     Virgie Frew tolerated treatment well.    Post blood return: Brisk  Post-Flush: NS and Heparin 100 units/ml    Patient Education  Learner: Patient  Barriers to learning: No Barriers  Readiness to learn: Acceptance  Method: Explanation    Treatment Education: Information/teaching given to patient including: signs and symptoms of infection, bleeding, adverse reaction(s), symptom control, and when to notify MD.    Lytle Michaels Prevention Education: Instructed patient to call for assistance.    Pain Education: Patient instructed to contact nurse if pain should develop or if their current pain therapy becomes ineffective.    Response: Verbalizes understanding    Discharge Plan  Discharge instructions given to patient.  Future appointments given and reviewed with treatment plan.  Discharge Mode: Ambulatory  Accompanied by: Self  Discharged To: Home

## 2019-06-24 NOTE — Telephone Encounter (Addendum)
RN called patient in which there was no answer at this time.   Left a message requesting a call back to [place] at [phone].     CALL CENTER:  When pt returns call please confirm that he will seek follow up care with outside hospital where he had his surgery [facility]).    Per [name] - need a referral from [facility] as [employer] is an HMO that we generally have issues with  ______________________________________________________________________  Brian Ritter [phone]  From Farrel Conners, MD  Medical [employer]   Service Location: [place] UNSPECIFIED. Phone: N/A   Plan Name: [facility] follow up  Dispo/Follow up plan:    Follow up with surgeon at outside hospital within 1-2 weeks for wound check, suture removal.  [facility]  Pt is s/p IRRIGATION AND DEBRIDEMENT, POSSIBLE RE-FUSION THORACIC SPINE   REVISION LAMINECTOMY FUSION LUMBAR POSTERIOR 06-13-19  ________________________________________________________________________  Per Ortho Spine ED note:  Assessment:  Brian Ritter is a 43 year old male who presents with new, acute onset bilateral lower extremity nonmyotomal weakness and nondermatomal sensation loss in the setting of recent spine I+D on 8/3 after which he refused recommended outpatient IV abx.  LLE sensation and bilateral strength improving compared to prior exam. Area of peri-incisional swelling demonstrates no superficial gross evidence of infection.     Paged by primary team due to increasing back pain. Patient with known post op seroma and no purulence on exam, although evidence of granulation tissue. Vitals and labs normal, including last CRP, but will repeat CRP this morning. Patient already on appropriate abx per ID team. Low suspicion for abscess requiring intervention. Patient otherwise improving clinically with strength and sensation essentially at baseline.    Plan/Recs:  - Treatment Plan:  No urgent indication for surgical intervention  - Antibiotics per  primary team/ID  - Activity:  Activity as tolerated with assistance  - Immobilization:  Brace when out of bed.  - Pain control: multimodal pain control with PO meds, minimize IV   - F/u CRP (added on)    Dispo/Follow up plan:    Follow up with surgeon at outside hospital within 1-2 weeks for wound check, suture removal. Please make NPO and page spine if patient clinically deteriorates.

## 2019-06-25 ENCOUNTER — Ambulatory Visit (HOSPITAL_BASED_OUTPATIENT_CLINIC_OR_DEPARTMENT_OTHER): Admit: 2019-06-25 | Discharge: 2019-06-27 | Payer: Medicaid Other

## 2019-06-25 VITALS — BP 130/58 | HR 64 | Temp 98.2°F | Resp 16 | Ht 66.0 in | Wt 165.0 lb

## 2019-06-25 DIAGNOSIS — L089 Local infection of the skin and subcutaneous tissue, unspecified: Secondary | ICD-10-CM

## 2019-06-25 LAB — BLOOD CULTURE
Blood Culture Result: NO GROWTH
Blood Culture Result: NO GROWTH

## 2019-06-25 MED ORDER — SODIUM CHLORIDE 0.9 % IV SOLN
INTRAVENOUS | Status: AC
Start: 2019-06-25 — End: 2019-06-25
  Administered 2019-06-25: 16:00:00 via INTRAVENOUS

## 2019-06-25 MED ORDER — SODIUM CHLORIDE 0.9 % IV SOLN
600.0000 mg | Freq: Once | INTRAVENOUS | Status: AC
Start: 2019-06-25 — End: 2019-06-25
  Administered 2019-06-25: 600 mg via INTRAVENOUS
  Filled 2019-06-25: qty 600

## 2019-06-25 NOTE — Interdisciplinary (Signed)
Brian Ritter-Chemotherapy Infusion Nursing Note - [place] is a 43 year old male who presents for infusion of Antibiotic: Daptomycin 600mg .    Vitals:    06/25/19 1557 06/25/19 1702   BP: 121/85 130/58   BP Location: Right arm Left arm   BP Patient Position: Sitting Sitting   Pulse: 115 64   Resp: 15 16   Temp: 97.4 F (36.3 C) 98.2 F (36.8 C)   TempSrc: Oral Temporal   SpO2: 100%    Weight: 74.8 kg (165 lb)    Height: 5\' 6"  (1.676 m)      Pain Score: 0  Body surface area is 1.87 meters squared.  Body mass index is 26.63 kg/m.    Pre-treatment nursing assessment:  Brian Ritter arrived ambulatory and is alert and oriented.  VSS. Denies n/v/d/c, fever, SOB, or cough. Reports 2/10 back pain related to his surgery but declines intervention at this time.  RUA SL PICC dressing peeling at edges. Pt reports he got it wet yesterday.  PICC dressing, biopatch, statlock, and clave changed.    Daptomycin infused over 30 minutes as ordered. Pt tolerated well.    Brian Ritter tolerated treatment well.    Post blood return: Brisk  Post-Flush: NS and Heparin 100 units/ml    Medications   sodium chloride 0.9% infusion ( IntraVENOUS Completed 06/25/19 1703)   DAPTOmycin (CUBICIN) 600 mg in sodium chloride 0.9 % 100 mL IVPB (0 mg IntraVENOUS Completed 06/25/19 1657)      Patient Education  Learner: Patient  Barriers to learning: No Barriers  Readiness to learn: Acceptance  Method: Explanation    Treatment Education: Information/teaching given to patient including: signs and symptoms of infection, bleeding, adverse reaction(s), symptom control, and when to notify MD.    Lytle Michaels Prevention Education: Instructed patient to call for assistance.    Pain Education: Patient instructed to contact nurse if pain should develop or if their current pain therapy becomes ineffective.    Response: Verbalizes understanding    Discharge Plan  Discharge instructions given to patient.  Future appointments given and reviewed  with treatment plan.  Discharge Mode: Ambulatory  Discharge Time: 1707  Accompanied by: Self  Discharged To: Home

## 2019-06-26 ENCOUNTER — Telehealth (HOSPITAL_BASED_OUTPATIENT_CLINIC_OR_DEPARTMENT_OTHER): Payer: Self-pay

## 2019-06-26 ENCOUNTER — Ambulatory Visit (HOSPITAL_BASED_OUTPATIENT_CLINIC_OR_DEPARTMENT_OTHER): Admit: 2019-06-26 | Discharge: 2019-06-26 | Disposition: A | Discharge status: Home Health | Payer: Medicaid Other

## 2019-06-26 NOTE — Telephone Encounter (Signed)
Pt did not show up for infusion center appointment today, 06/26/2019, for Daptomycin. Message routed to MD.

## 2019-06-27 ENCOUNTER — Telehealth (HOSPITAL_BASED_OUTPATIENT_CLINIC_OR_DEPARTMENT_OTHER): Payer: Self-pay

## 2019-06-27 ENCOUNTER — Telehealth (HOSPITAL_BASED_OUTPATIENT_CLINIC_OR_DEPARTMENT_OTHER): Payer: Self-pay | Admitting: Infectious Disease

## 2019-06-27 ENCOUNTER — Ambulatory Visit (HOSPITAL_BASED_OUTPATIENT_CLINIC_OR_DEPARTMENT_OTHER): Admit: 2019-06-27 | Discharge: 2019-06-27 | Disposition: A | Discharge status: Home Health | Payer: Medicaid Other

## 2019-06-27 NOTE — Telephone Encounter (Signed)
OUTPATIENT PARENTERAL ANTIBIOTIC THERAPY (OPAT) ENROLLMENT NOTE      Enrollment Date: June 23, 2019  Name: Zebediah Beezley   DOB: 1975/12/16   Age: 43 year old  MRN: [id number]     OPAT Provider:    Daralene Milch, MD   First Appointment:    07/04/19 @ 0900 - [facility]   Type of Line Access:   Francena Hanly Power Injectable PICC Single Lumen  [ 06/22/19 ]   Planned Abx End Date:     07/31/19   OPAT Diagnosis:    MRSA spinal hardware infection.        OPAT IV ANTIBIOTICS     1.   DAPTOmycin (CUBICIN) 600 MG IV every 24 hours    PRE/POST APPOINTMENT LABS,IMAGING AND OTHER TESTS      Labs:   CBC w/diff - CMP - ESR - CRP - CPK   Imaging:    Other:        OPAT [facility]:  [facility]:  [phone]                                      OPAT IMPRESSIONS AND RECOMMENDATIONS      Olly Shiner is a 43 year old male with history of Ehler's Danlos syndrome, ankylosing spondylitis and mutliple spinal surgeries, who presented after leaving from [place] with worsening spinal pain concerning for postoperative infection.  ID is consulted regarding postoperative thoracolumbar spinal fusion with hardware in place complicated by MRSA infection diagnosis [place]. Unfortunately, with recently placed hardware, open surgical wound, and documented infection, patient will need long duration of antibiotic therapy to mange this infection. Has collection of fluid at superior aspect that may be amenable to washout    #Thoracolumbar spinal infection with hardware in place  -Patient preferring to follow at infusion center for IV antibiotics, therefore would recommend daptomycin 657m daily (patient seems to have tolerated in ED)  -Patient will need 6 weeks IV therapy then consideration for oral suppression therapy afterwards, given remaining hardware  -No need for PICC placement if patient going to infusion center for daptomycin.   - If the patient leaves AMA,  does not want IV antibiotics, and/or does not want to follow up with [place] ID, would treat him with Bactrim and have him follow up with [facility] ID.    This patient requires outpatient IV antibiotics.  Continue the patient's IV Daptomycin through at least 9/20 (date) for a total of 6 weeks (from 8/10 through 9/20) for the patient's ID diagnosis of MRSA spinal hardware infection.      Please complete the following prior to discharge:  1. Enroll the patient in the OPAT (Outpatient Parenteral Antibiotic Therapy) program through the EPIC inpatient OPAT orderset (enter "OPAT" in ordersets)  2. Please order weekly labs (CBC w/ diff, CMP, ESR, CRP, CPK) to be faxed to the patient's OPAT ID physician at [phone]   Recommendations discussed with the primary team.    RBernerd Pho DO MPH  Infectious Disease Fellow, PGY-4    Patient discussed with Dr. WLake Bells   Attending Attestation    I have seen and examined the patient with Dr. SErby Pian  I have reviewed the relevant laboratory and imaging data.  I have reviewed the patient's medications.  I agree with the history, physical,  assessment and plan as discussed with Dr. Erby Pian.      43 yo man with substance use disorder and homelessness with recent spinal fusion complicated by post-op wound dehiscence and presumed hardware infection at OSH due to MRSA.  Unable to remove hardware at this time.  Given his preferences and living situation, would recommend treating him with 6 weeks of IV daptomycin to be given at the infusion center.  Ideally would do this through a PIV and not a PICC line.  He will need OPAT and ID clinic follow up as outlined above.  He will likely need chronic suppression following his course of daptomycin.    Currie Paris, MD, MS  Infectious Diseases Attending       This patient's antibiotics and weekly labs will be managed by the OPAT team.      The patient will be seen in clinic on    07/04/19  by     Dr. Miguel Aschoff             The above  information has been confirmed with the patient's home health company.

## 2019-06-27 NOTE — Telephone Encounter (Signed)
Transitional Telephonic Nurse (TTN) Post Discharge Manual Follow-Up Telephone Encounter   Inpatient Encounter CSN: [id number]  Admission Date: 2019-06-20  Discharge Date: 2019-06-23  Autocall Status: No Response  Call Date: 2019-06-24  Contact Number: [phone]  Primary Diagnosis: WOUND INFECTION  Clinician: Fritz Pickerel MSN, RN  Phone: [phone]   Email: [email or web address]   2019-06-27 11:49:47 - Pending Resolution; No Answer/Voicemail 1st Attempt;     2019-06-27 16:59:45 - Patient Not Reached; Encounter Closed

## 2019-06-27 NOTE — Telephone Encounter (Signed)
Brian Ritter did not show for his appointment on 8/17 for his Daptomycin.  Msg Routed to MD

## 2019-06-28 ENCOUNTER — Telehealth (HOSPITAL_BASED_OUTPATIENT_CLINIC_OR_DEPARTMENT_OTHER): Payer: Self-pay

## 2019-06-28 ENCOUNTER — Ambulatory Visit (HOSPITAL_BASED_OUTPATIENT_CLINIC_OR_DEPARTMENT_OTHER): Admit: 2019-06-28 | Discharge: 2019-06-28 | Disposition: A | Discharge status: Home Health | Payer: Medicaid Other

## 2019-06-28 ENCOUNTER — Emergency Department
Admission: EM | Admit: 2019-06-28 | Discharge: 2019-06-28 | Discharge status: Against Medical Advice | Payer: Medicaid Other | Attending: Emergency Medicine | Admitting: Emergency Medicine

## 2019-06-28 DIAGNOSIS — Z5321 Procedure and treatment not carried out due to patient leaving prior to being seen by health care provider: Secondary | ICD-10-CM

## 2019-06-28 NOTE — Telephone Encounter (Signed)
Good Afternoon, Brian Ritter missed his Daptomycin appt on 8/18.  Please call Infusion Services for any questions.  Thank You

## 2019-06-28 NOTE — ED MD Progress Note (Signed)
Patient eloped from the emergency department after triage prior to an evaluation.  Attempted to locate patient in bathroom, hallways, triage.   Nursing staff informed.

## 2019-06-28 NOTE — ED Notes (Addendum)
Pt coming multiple times to Triage window and staff, aggressively speaking to yelling regarding wait time.  Pt provided reassurance and update on wait.  Ultimately pt returned to Triage screaming verbal insults at staff, saying he was leaving without being seen.  Security involved, disruptive to patients.  Continued to scream out in the street.    Returned asking to charge his phone.  Was asked to leave for non-medical complaints.  LWBS    Advised to f/u with infusion clinic.  Security advised pt may return is not aggressive/threat to patients/staff for medical treatment only.

## 2019-06-28 NOTE — ED Notes (Signed)
Bed: 08B  Expected date:   Expected time:   Means of arrival:   Comments:

## 2019-06-29 ENCOUNTER — Ambulatory Visit (HOSPITAL_BASED_OUTPATIENT_CLINIC_OR_DEPARTMENT_OTHER): Admit: 2019-06-29 | Discharge: 2019-06-29 | Disposition: A | Discharge status: Home Health | Payer: Medicaid Other

## 2019-06-30 ENCOUNTER — Ambulatory Visit (HOSPITAL_BASED_OUTPATIENT_CLINIC_OR_DEPARTMENT_OTHER): Admit: 2019-06-30 | Discharge: 2019-06-30 | Disposition: A | Discharge status: Home Health | Payer: Medicaid Other

## 2019-06-30 ENCOUNTER — Telehealth (HOSPITAL_BASED_OUTPATIENT_CLINIC_OR_DEPARTMENT_OTHER): Payer: Self-pay | Admitting: Infectious Disease

## 2019-06-30 ENCOUNTER — Encounter (INDEPENDENT_AMBULATORY_CARE_PROVIDER_SITE_OTHER): Payer: Self-pay | Admitting: Internal Medicine

## 2019-06-30 NOTE — Progress Notes (Signed)
Asked by infusion center to adjust dates on med support plan and to set next date for 8/21.    Med support plan adjusted.    Victorino Dike, MD  Health Sciences Clinical Professor of Medicine  Division of [facility] Medicine

## 2019-06-30 NOTE — Addendum Note (Signed)
Addended by: Victorino Dike on: 06/30/2019 04:36 PM     Modules accepted: Orders

## 2019-06-30 NOTE — Telephone Encounter (Signed)
Date: June 30, 2019   Patient Name: Brian Ritter   DOB: Sep 08, 1976  Age: 43 year old     Attempted to reach patient about his missed infusion center appointments as of today he has missed 6 doses of Daptomycin for treatment of MRSA Spinal Hardware Infection.     Noted that patient had checked into the ED on 06/28/19 but left without being seen. It was also noted that the patient was verbally abusive screaming insults at staff prior to leaving ED.     Left a voicemail with direct clinic/call center phone number and requested a call back to discuss his missed appointments and determine if there are any barriers to coming in for his infusions.     Matter pending call back from patient.     Tymar Polyak  [facility]: [phone]

## 2019-07-01 ENCOUNTER — Ambulatory Visit (HOSPITAL_BASED_OUTPATIENT_CLINIC_OR_DEPARTMENT_OTHER): Admit: 2019-07-01 | Discharge: 2019-07-01 | Disposition: A | Discharge status: Home Health | Payer: Medicaid Other

## 2019-07-01 ENCOUNTER — Telehealth (HOSPITAL_BASED_OUTPATIENT_CLINIC_OR_DEPARTMENT_OTHER): Payer: Self-pay | Admitting: Infectious Disease

## 2019-07-01 NOTE — Telephone Encounter (Signed)
Left message for pts appt on 07/04/2019

## 2019-07-02 ENCOUNTER — Ambulatory Visit (HOSPITAL_BASED_OUTPATIENT_CLINIC_OR_DEPARTMENT_OTHER): Payer: Medicaid Other

## 2019-07-03 ENCOUNTER — Ambulatory Visit (HOSPITAL_BASED_OUTPATIENT_CLINIC_OR_DEPARTMENT_OTHER): Admit: 2019-07-03 | Payer: Medicaid Other

## 2019-07-03 ENCOUNTER — Inpatient Hospital Stay
Admission: EM | Admit: 2019-07-03 | Discharge: 2019-07-25 | DRG: 813 | Discharge status: Against Medical Advice | Payer: Medicaid Other | Attending: Hospitalist | Admitting: Hospitalist

## 2019-07-03 DIAGNOSIS — Z91048 Other nonmedicinal substance allergy status: Secondary | ICD-10-CM

## 2019-07-03 DIAGNOSIS — Z5329 Procedure and treatment not carried out because of patient's decision for other reasons: Secondary | ICD-10-CM | POA: Diagnosis not present

## 2019-07-03 DIAGNOSIS — Z91128 Patient's intentional underdosing of medication regimen for other reason: Secondary | ICD-10-CM

## 2019-07-03 DIAGNOSIS — I808 Phlebitis and thrombophlebitis of other sites: Secondary | ICD-10-CM | POA: Diagnosis not present

## 2019-07-03 DIAGNOSIS — G9763 Postprocedural seroma of a nervous system organ or structure following a nervous system procedure: Secondary | ICD-10-CM

## 2019-07-03 DIAGNOSIS — Z20828 Contact with and (suspected) exposure to other viral communicable diseases: Secondary | ICD-10-CM | POA: Diagnosis present

## 2019-07-03 DIAGNOSIS — B9689 Other specified bacterial agents as the cause of diseases classified elsewhere: Secondary | ICD-10-CM | POA: Diagnosis not present

## 2019-07-03 DIAGNOSIS — G2581 Restless legs syndrome: Secondary | ICD-10-CM | POA: Diagnosis present

## 2019-07-03 DIAGNOSIS — Z833 Family history of diabetes mellitus: Secondary | ICD-10-CM

## 2019-07-03 DIAGNOSIS — T368X6A Underdosing of other systemic antibiotics, initial encounter: Secondary | ICD-10-CM | POA: Diagnosis present

## 2019-07-03 DIAGNOSIS — T8149XA Infection following a procedure, other surgical site, initial encounter: Secondary | ICD-10-CM

## 2019-07-03 DIAGNOSIS — T148XXA Other injury of unspecified body region, initial encounter: Secondary | ICD-10-CM

## 2019-07-03 DIAGNOSIS — I472 Ventricular tachycardia: Secondary | ICD-10-CM | POA: Diagnosis not present

## 2019-07-03 DIAGNOSIS — B9562 Methicillin resistant Staphylococcus aureus infection as the cause of diseases classified elsewhere: Secondary | ICD-10-CM | POA: Diagnosis present

## 2019-07-03 DIAGNOSIS — I5020 Unspecified systolic (congestive) heart failure: Secondary | ICD-10-CM | POA: Diagnosis present

## 2019-07-03 DIAGNOSIS — Y92239 Unspecified place in hospital as the place of occurrence of the external cause: Secondary | ICD-10-CM | POA: Diagnosis present

## 2019-07-03 DIAGNOSIS — I471 Supraventricular tachycardia: Secondary | ICD-10-CM | POA: Diagnosis not present

## 2019-07-03 DIAGNOSIS — G834 Cauda equina syndrome: Secondary | ICD-10-CM | POA: Diagnosis present

## 2019-07-03 DIAGNOSIS — F840 Autistic disorder: Secondary | ICD-10-CM | POA: Diagnosis present

## 2019-07-03 DIAGNOSIS — Z8249 Family history of ischemic heart disease and other diseases of the circulatory system: Secondary | ICD-10-CM

## 2019-07-03 DIAGNOSIS — R300 Dysuria: Secondary | ICD-10-CM | POA: Diagnosis not present

## 2019-07-03 DIAGNOSIS — E871 Hypo-osmolality and hyponatremia: Secondary | ICD-10-CM | POA: Diagnosis not present

## 2019-07-03 DIAGNOSIS — A419 Sepsis, unspecified organism: Secondary | ICD-10-CM | POA: Diagnosis not present

## 2019-07-03 DIAGNOSIS — Y848 Other medical procedures as the cause of abnormal reaction of the patient, or of later complication, without mention of misadventure at the time of the procedure: Secondary | ICD-10-CM | POA: Diagnosis not present

## 2019-07-03 DIAGNOSIS — Z981 Arthrodesis status: Secondary | ICD-10-CM

## 2019-07-03 DIAGNOSIS — M4625 Osteomyelitis of vertebra, thoracolumbar region: Secondary | ICD-10-CM | POA: Diagnosis present

## 2019-07-03 DIAGNOSIS — T8463XA Infection and inflammatory reaction due to internal fixation device of spine, initial encounter: Secondary | ICD-10-CM | POA: Diagnosis present

## 2019-07-03 DIAGNOSIS — T80219A Unspecified infection due to central venous catheter, initial encounter: Secondary | ICD-10-CM | POA: Diagnosis not present

## 2019-07-03 DIAGNOSIS — L089 Local infection of the skin and subcutaneous tissue, unspecified: Secondary | ICD-10-CM | POA: Diagnosis present

## 2019-07-03 DIAGNOSIS — M96842 Postprocedural seroma of a musculoskeletal structure following a musculoskeletal system procedure: Principal | ICD-10-CM | POA: Diagnosis present

## 2019-07-03 DIAGNOSIS — B182 Chronic viral hepatitis C: Secondary | ICD-10-CM | POA: Diagnosis present

## 2019-07-03 DIAGNOSIS — B354 Tinea corporis: Secondary | ICD-10-CM | POA: Diagnosis not present

## 2019-07-03 DIAGNOSIS — Y838 Other surgical procedures as the cause of abnormal reaction of the patient, or of later complication, without mention of misadventure at the time of the procedure: Secondary | ICD-10-CM | POA: Diagnosis present

## 2019-07-03 DIAGNOSIS — M459 Ankylosing spondylitis of unspecified sites in spine: Secondary | ICD-10-CM | POA: Diagnosis present

## 2019-07-03 DIAGNOSIS — B49 Unspecified mycosis: Secondary | ICD-10-CM

## 2019-07-03 DIAGNOSIS — M549 Dorsalgia, unspecified: Secondary | ICD-10-CM

## 2019-07-03 DIAGNOSIS — Z79899 Other long term (current) drug therapy: Secondary | ICD-10-CM

## 2019-07-03 DIAGNOSIS — Q796 Ehlers-Danlos syndrome, unspecified: Secondary | ICD-10-CM

## 2019-07-03 DIAGNOSIS — R7881 Bacteremia: Secondary | ICD-10-CM | POA: Diagnosis present

## 2019-07-03 MED ORDER — SODIUM CHLORIDE 0.9 % IV SOLN
600.0000 mg | Freq: Once | INTRAVENOUS | Status: AC
Start: 2019-07-03 — End: 2019-07-04
  Administered 2019-07-04: 01:00:00 600 mg via INTRAVENOUS
  Filled 2019-07-03: qty 600

## 2019-07-03 MED ORDER — MORPHINE SULFATE 2 MG/ML IJ SOLN
6.0000 mg | Freq: Once | INTRAMUSCULAR | Status: AC
Start: 2019-07-03 — End: 2019-07-04
  Administered 2019-07-04: 6 mg via INTRAVENOUS
  Filled 2019-07-03: qty 3

## 2019-07-03 NOTE — ED Notes (Signed)
Bed: 12A  Expected date:   Expected time:   Means of arrival:   Comments:  MEDIC

## 2019-07-03 NOTE — ED Provider Notes (Signed)
Nursing triage CC:  Chief Complaint   Patient presents with   . Back Pain     Surgery July herniated disk, Contracted MRSA Hospitalized last week.  Numbness/ tingling in Right leg.        HPI:  43 year old male presenting with R leg pain. Past medical history of ankylosing spondylitis, Ehler's Danlos, autism, and recent spinal fusion surgery c/b postop MRSA infected seroma [place].  Although he was prescribed a prolonged course of daptomycin at that time, he never started it.  [name] boyfriend in his camper [employer] [place] and presented to the this emergency department shortly thereafter complaining of back pain and bilateral lower extremity weakness.  He was hospitalized for concern of infection of his surgical hardware, and received an MRI which showed a nonspecific enhancing seroma near his hardware. Ortho Spine was consulted.  He did not receive surgical drainage while he was hospitalized and instead was discharged two weeks ago on a prolonged course of daptomycin via infusion clinic.    Upon interview, patient states that he was unable to go to his daptomycin infusions for the past 7 days due to his camper [employer] being broken down.  Over that time he has had subjective fevers, chills in excruciating back pain with diffuse right leg numbness, pain, and weakness.  Also associated with "restless legs" symptoms in his right foot.  No nausea, vomiting.  No home pain medications.    Med Hx:  - ankylosing spondylitis, Ehler's Danlos, autism, and recent spinal fusion surgery c/b postop MRSA infected seroma    Surg Hx:  No past surgical history on file.     Social Hx:  - lives in [place] with boyfriend    Medications:  Prior to Admission Medications   Prescriptions Last Dose Informant Patient Reported? Taking?   DAPTOmycin (CUBICIN) 600 mg in sodium chloride 100 mL IVPB   No No   Sig: Inject 600 mg into vein every 24 hours  Indications: MRSA seroma.   acetaminophen (TYLENOL) 325 MG tablet   No No   Sig: Take 2 tablets (650 mg) by mouth every 8 hours as needed for Mild Pain (Pain Score 1-3) for up to 10 days.   oxyCODONE (ROXICODONE) 5 MG immediate release tablet   No No   Sig: Take 1 tablet (5 mg) by mouth every 4 hours as needed for Moderate Pain (Pain Score 4-6) or Severe Pain (Pain Score 7-10) for up to 5 days.      Facility-Administered Medications: None       Allergies:  Wound dressing adhesive    Review of Systems:  All other systems reviewed and negative unless otherwise noted in the HPI or above. This was done per my custom and practice for systems appropriate to the chief complaint in an emergency department setting and varies depending on the quality of history that the patient is able to provide.    Physical Exam:  There were no vitals filed for this visit.  General:  Middle-aged male lying uncomfortably in bed, eyes tightly closed, in obvious pain  Cardiac: reg rate and rhythm, no gallups/rubs/murmurs, no peripheral edema  Resp: nonlabored, clear to auscultation bilaterally, no wheezing/ronchi/rales  Abd: soft, no guarding or rigidity, nontender  Psych: good eye contact, not responding to internal stimuli, appropriate mood and affect  Neuro: moves all four extremities spontaneously, normal eye movements, no aphasia  Derm: no obvious rashes or lesions  Head: normocephalic, atraumatic  Back:  Large supraspinal incision with sutires in place, no purulence, edema, or erythema  Extreme tenderness to palpation of the thoracic and lumbar midline  No pain to palpation of paraspinal musculature cervical, thoracic, or lumbar regions  Pain with all motion, including the jolting motion of his gurney being raised to height  Deferred straight leg raise  Lower ext: No gross deformities, rashes, lesions, or bleeding.   Normal bulk and tone. No atrophy.    Dorsalis pedis and posterior tibial pulses 2+ bilaterally  Motor (0=nothing,  1=twitch, 2=fail against gravity, 3=fail against resist, 4=weak against resist, 5=normal)  Lower Extremity R/L   Hip flexors (Lumbar plexus / L1-3) 4/5   Hip extensors (Inf gluteal / L5-S2) Defer'd   Hip aDduction (Obturator / L2-4) 5/5   Hip aBduction (Sup gluteal / L4-S1) 5/5   Knee extension (Femoral / L3-4) 3/5   Knee flexion (Sciatic / L5-S2) 3/5   Ankle dorsiflexion (Deep Peroneal / L4,5) 4/5   Great toe extension (Deep Peroneal / S1,2) 4/5   Ankle plantarflexion (Tibial / S1,2) 4/5   Rectal tone (Anal sphincter / S2-S4) Deffered   Reflexes (0=nothing, 1=twitch, 2=normal, 3=brisk, 4=clonus)  Lower Extremity   R/L   Patellar (L4) 2/2   Achilles (S1) 2/2   Babinski Mute/Mute   Sensory:  Lower Extremity R/L   Lateral thigh (Lat Fem / L2) SILT/SILT   Medial thigh (Ant Fem / L3)  SILT/SILT   Medial calf (Saph / L4) SILT/SILT   Lateral calf (Sural / L5) SILT/SILT   Lateral foot (Peroneal / S1) SILT/SILT   Ambulation: deferred    Labs:  Labs Reviewed - No data to display    Imaging:  No orders to display     Meds administered:  Medications - No data to display    MDM:  43 year old male presenting with R leg pain. Past medical history of ankylosing spondylitis, Ehler's Danlos, autism, and recent spinal fusion surgery c/b postop MRSA infected seroma. Presentation most concerning for infection of spinal hardware considering his recent infectious history and similarity of symptoms to previous admission. Moderate index of suspicion for shifted spinal hardware, diffuse weakness of R leg concerning for cauda equina (though radicular pain probably more likely). Fortunately, he does not appear septic (zero of four SIRS criteria) but will dose his home antibiotic while in the emergency department. Ordered MRI spine per verbal recommendation of ortho consult and admitted to medicine.      [name], MD  Resident  07/04/19 1721       [name], MD  07/05/19 [phone]

## 2019-07-03 NOTE — EMS Narrative (Addendum)
Pt Age: 43 Years; Gender: Male; Is there any reason to suspect patient may   HAVE or may have been EXPOSED to COVID-19: No; Provide specific details for   suspecting patient may HAVE or have been EXPOSED to COVID-19: ;  Primary Impression: Body Pain (non-traumatic);  [id number] arrived on scene to a [employer] parking lot to find a 43 year old male   laying supine in the back of a van. Pt C/C of back pain and generally not   feeling good.    Medical History: MS:  Back Pain, Behavior: Autistic; Pt Medication: ; Pt   Allergies: (No Known Drug Allergies), NKA - No Known Allergies;  Pt had surgery for a herniated disc and a spinal infusion in July and   reported having complications from that surgery. Pt was in too much pain   for his friend to transport him to the hospital so the or activated 9-1-1.    Date/Time: 07/03/2019; Mental Status: Normal Baseline for Patient; Neuro:   Normal Baseline for Patient; Eye Lt: 4-mm, PERRL; Eye Rt: 4-mm, PERRL;     Anox4. ABCs intact. Pt breathing full and effective. Pt reported having a   herniated disc surgery on his back in July and has had complications ever   since. Pt contracted MRSA and was in the hospital a week and a half ago for   complications. Pt reports feeling tingling and numbness going down his   right leg.    Vitals monitored and pt put in position of comfort.    Base Called: [place]  Pt transported code 80 to [place] center per pt requests. Pt was   assisted/walked to the gurney from his [name]. Pt belongings left at pt side.   Transfer of care completed without incident.    CC:  Chief Complaint Anatomic Location: Back    HPI:  Primary Symptom: Dorsalgia, unspecified  Provider's Primary Impression: Acute pain, not elsewhere classified  Initial Patient Acuity: Lower Acuity [name])    Alert:  Patient Care Report Number: [id number]  Incident Number: [id number]  EMS Vehicle (Unit) Number: [id number]  EMS Unit Call Sign: [id number]  Level of Care of This Unit: BLS-Basic  /EMT  Incident Location Type: Private commercial establishments as place  Incident Street Address: [place]: [id number]  Incident ZIP Code: [phone]    Assessment:  Heart Assessment: Normal  Mental Status Assessment: Normal Baseline for Patient    Procedure - Arrest:  Cardiac Arrest: No    Procedure - Exam:    Procedure - Injury:    Procedure - Airway:    Procedure - Medications:    Procedure - Generic:    Demographics History:  Environmental/Food Allergies: [id number]    Demographics Practitioner:    Demographics Patient:  Last Name: Ritter  First Name: Brian  Patient's Home Address: [place]: [phone]  Gender: Male  Age: 31  Age Units: Years    Demographics Times:  Unit Notified by Dispatch Date/Time: 2020-08-23T20:32:34.000-07:00  Unit En Route Date/Time: 2020-08-23T20:35:42.000-07:00  Unit Arrived on Scene Date/Time: 2020-08-23T20:49:52.000-07:00  Arrived at Patient Date/Time: 2020-08-23T00:00:00.000-07:00  Unit Left Scene Date/Time: 2020-08-23T21:36:38.000-07:00  Patient Arrived at Destination Date/Time: 2020-08-23T21:36:46.000-07:00    Demographics Payment:  Primary Method of Payment: Self Pay

## 2019-07-04 ENCOUNTER — Inpatient Hospital Stay (HOSPITAL_COMMUNITY): Payer: Medicaid Other

## 2019-07-04 ENCOUNTER — Ambulatory Visit (HOSPITAL_BASED_OUTPATIENT_CLINIC_OR_DEPARTMENT_OTHER): Admit: 2019-07-04 | Discharge: 2019-07-04 | Disposition: A | Discharge status: Home Health | Payer: Medicaid Other

## 2019-07-04 ENCOUNTER — Ambulatory Visit (HOSPITAL_BASED_OUTPATIENT_CLINIC_OR_DEPARTMENT_OTHER): Payer: Medicaid Other | Admitting: Infectious Disease

## 2019-07-04 ENCOUNTER — Inpatient Hospital Stay (HOSPITAL_BASED_OUTPATIENT_CLINIC_OR_DEPARTMENT_OTHER): Payer: Medicaid Other

## 2019-07-04 DIAGNOSIS — M549 Dorsalgia, unspecified: Secondary | ICD-10-CM | POA: Diagnosis present

## 2019-07-04 DIAGNOSIS — J90 Pleural effusion, not elsewhere classified: Secondary | ICD-10-CM

## 2019-07-04 DIAGNOSIS — Z981 Arthrodesis status: Secondary | ICD-10-CM

## 2019-07-04 DIAGNOSIS — T8149XA Infection following a procedure, other surgical site, initial encounter: Secondary | ICD-10-CM

## 2019-07-04 DIAGNOSIS — M4694 Unspecified inflammatory spondylopathy, thoracic region: Secondary | ICD-10-CM

## 2019-07-04 DIAGNOSIS — M47816 Spondylosis without myelopathy or radiculopathy, lumbar region: Secondary | ICD-10-CM

## 2019-07-04 DIAGNOSIS — Z590 Homelessness: Secondary | ICD-10-CM

## 2019-07-04 DIAGNOSIS — M459 Ankylosing spondylitis of unspecified sites in spine: Secondary | ICD-10-CM

## 2019-07-04 DIAGNOSIS — G834 Cauda equina syndrome: Secondary | ICD-10-CM

## 2019-07-04 DIAGNOSIS — R937 Abnormal findings on diagnostic imaging of other parts of musculoskeletal system: Secondary | ICD-10-CM

## 2019-07-04 LAB — COVID-19 CORONAVIRUS DETECTION ASSAY AT ~~LOC~~ LAB: COVID-19 Coronavirus Result: NOT DETECTED

## 2019-07-04 LAB — BASIC METABOLIC PANEL, BLOOD
Anion Gap: 12 mmol/L (ref 7–15)
BUN: 11 mg/dL (ref 6–20)
Bicarbonate: 24 mmol/L (ref 22–29)
Calcium: 8.9 mg/dL (ref 8.5–10.6)
Chloride: 97 mmol/L — ABNORMAL LOW (ref 98–107)
Creatinine: 0.92 mg/dL (ref 0.67–1.17)
GFR: >60 mL/min
Glucose: 111 mg/dL — ABNORMAL HIGH (ref 70–99)
Potassium: 4.2 mmol/L (ref 3.5–5.1)
Sodium: 133 mmol/L — ABNORMAL LOW (ref 136–145)

## 2019-07-04 LAB — CBC WITH DIFF, BLOOD
ANC-Manual Mode: 5.3 10*3/uL (ref 1.6–7.0)
Abs Basophils: 0 10*3/uL
Abs Eosinophils: 0.3 10*3/uL (ref 0.0–0.5)
Abs Lymphs: 2.1 10*3/uL (ref 0.8–3.1)
Abs Monos: 1 10*3/uL — ABNORMAL HIGH (ref 0.2–0.8)
Basophils: 0 %
Eosinophils: 3 %
Hct: 40.1 % (ref 40.0–50.0)
Hgb: 13 gm/dL — ABNORMAL LOW (ref 13.7–17.5)
Lymphocytes: 24 %
MCH: 29.5 pg (ref 26.0–32.0)
MCHC: 32.4 g/dL (ref 32.0–36.0)
MCV: 90.9 um3 (ref 79.0–95.0)
MPV: 9.3 fL — ABNORMAL LOW (ref 9.4–12.4)
Monocytes: 12 %
Plt Count: 227 10*3/uL (ref 140–370)
RBC: 4.41 10*6/uL — ABNORMAL LOW (ref 4.60–6.10)
RDW: 14.6 % — ABNORMAL HIGH (ref 12.0–14.0)
Segs: 61 %
WBC: 8.7 10*3/uL (ref 4.0–10.0)

## 2019-07-04 LAB — MDIFF
Number of Cells Counted: 117
Plt Est: ADEQUATE
RBC Comment: NORMAL

## 2019-07-04 LAB — LACTATE, BLOOD
Lactate: 1.7 mmol/L (ref 0.5–2.0)
Lactate: 1.2 mmol/L (ref 0.5–2.0)

## 2019-07-04 LAB — C-REACTIVE PROTEIN, BLOOD: CRP: 5.32 mg/dL — ABNORMAL HIGH (ref <0.5)

## 2019-07-04 LAB — SED RATE, BLOOD: Sed Rate: 44 mm/hr — ABNORMAL HIGH (ref 0–15)

## 2019-07-04 MED ORDER — ENOXAPARIN SODIUM 30 MG/0.3ML SC SOLN
30.0000 mg | Freq: Two times a day (BID) | SUBCUTANEOUS | Status: DC
Start: 2019-07-04 — End: 2019-07-04

## 2019-07-04 MED ORDER — SODIUM CHLORIDE 0.9 % IV SOLN
600.0000 mg | Freq: Once | INTRAVENOUS | Status: AC
Start: 2019-07-05 — End: 2019-07-05
  Administered 2019-07-05: 01:00:00 600 mg via INTRAVENOUS
  Filled 2019-07-04: qty 600

## 2019-07-04 MED ORDER — GADOBUTROL 1 MMOL/ML IV SOLN
7.0000 mL | Freq: Once | INTRAVENOUS | Status: AC
Start: 2019-07-04 — End: 2019-07-04
  Administered 2019-07-04: 16:00:00 7 mL via INTRAVENOUS
  Filled 2019-07-04: qty 7

## 2019-07-04 MED ORDER — OXYCODONE HCL 5 MG OR TABS
5.0000 mg | ORAL_TABLET | ORAL | Status: DC | PRN
Start: 2019-07-04 — End: 2019-07-13

## 2019-07-04 MED ORDER — SODIUM CHLORIDE 0.9 % IJ SOLN (CUSTOM)
3.0000 mL | Freq: Three times a day (TID) | INTRAMUSCULAR | Status: DC
Start: 2019-07-04 — End: 2019-07-25
  Administered 2019-07-04 – 2019-07-24 (×41): 3 mL via INTRAVENOUS

## 2019-07-04 MED ORDER — SODIUM CHLORIDE 0.9 % IJ SOLN (CUSTOM)
3.0000 mL | INTRAMUSCULAR | Status: DC | PRN
Start: 2019-07-04 — End: 2019-07-25
  Administered 2019-07-23: 3 mL via INTRAVENOUS

## 2019-07-04 MED ORDER — SODIUM CHLORIDE 0.9% TKO INFUSION
INTRAVENOUS | Status: DC | PRN
Start: 2019-07-04 — End: 2019-07-25

## 2019-07-04 MED ORDER — OXYCODONE HCL 10 MG OR TABS
10.0000 mg | ORAL_TABLET | ORAL | Status: DC | PRN
Start: 2019-07-04 — End: 2019-07-13
  Administered 2019-07-04 – 2019-07-13 (×41): 10 mg via ORAL
  Filled 2019-07-04 (×42): qty 1

## 2019-07-04 MED ORDER — GADOBUTROL 1 MMOL/ML IV SOLN
INTRAVENOUS | Status: AC
Start: 2019-07-04 — End: 2019-07-04
  Filled 2019-07-04: qty 10

## 2019-07-04 MED ORDER — SODIUM CHLORIDE 0.9 % IV SOLN
600.0000 mg | Freq: Once | INTRAVENOUS | Status: DC
Start: 2019-07-04 — End: 2019-07-04

## 2019-07-04 MED ORDER — LORAZEPAM 0.5 MG OR TABS
0.5000 mg | ORAL_TABLET | Freq: Once | ORAL | Status: AC
Start: 2019-07-04 — End: 2019-07-04
  Administered 2019-07-04: 0.5 mg via ORAL
  Filled 2019-07-04: qty 1

## 2019-07-04 MED ORDER — OXYCODONE HCL 5 MG OR TABS
5.0000 mg | ORAL_TABLET | ORAL | Status: DC | PRN
Start: 2019-07-04 — End: 2019-07-13
  Administered 2019-07-05 – 2019-07-13 (×10): 5 mg via ORAL
  Filled 2019-07-04 (×10): qty 1

## 2019-07-04 MED ORDER — MORPHINE SULFATE 2 MG/ML IJ SOLN
2.0000 mg | INTRAMUSCULAR | Status: AC | PRN
Start: 2019-07-04 — End: 2019-07-05
  Administered 2019-07-04 – 2019-07-05 (×4): 2 mg via INTRAVENOUS
  Filled 2019-07-04 (×3): qty 1

## 2019-07-04 MED ORDER — SODIUM CHLORIDE 0.9 % IV SOLN
8.0000 mg/kg | INTRAVENOUS | Status: DC
Start: 2019-07-04 — End: 2019-07-05

## 2019-07-04 MED ORDER — NALOXONE HCL 0.4 MG/ML IJ SOLN
0.1000 mg | INTRAMUSCULAR | Status: DC | PRN
Start: 2019-07-04 — End: 2019-07-25

## 2019-07-04 MED ORDER — LORAZEPAM 2 MG/ML IJ SOLN
2.0000 mg | Freq: Once | INTRAMUSCULAR | Status: AC
Start: 2019-07-04 — End: 2019-07-04
  Administered 2019-07-04: 04:00:00 2 mg via INTRAVENOUS
  Filled 2019-07-04: qty 1

## 2019-07-04 MED ORDER — MORPHINE SULFATE 2 MG/ML IJ SOLN
6.0000 mg | Freq: Once | INTRAMUSCULAR | Status: AC
Start: 2019-07-04 — End: 2019-07-04
  Administered 2019-07-04: 04:00:00 6 mg via INTRAVENOUS
  Filled 2019-07-04: qty 3

## 2019-07-04 NOTE — Interdisciplinary (Signed)
07/04/19 1526   Initial Assessment   CM Initial Assessment * Completed   Patient Information   Prior to Level of Function * Ambulatory/Independent with ADL's       Attempted to meet with patient to carry out CM initial assessment but patient is currently in MRI.  CM will meet with patient as soon as he gets back.

## 2019-07-04 NOTE — Progress Notes (Cosign Needed)
[facility] day:   0 days - Admitted on: 07/03/2019    Brian Ritter is a16 year oldmale with hx ofankylosing spondylitis, Ehler's Danlos, autism, and recent spinal fusion surgery c/b postop seroma with MRSA s/p I&D 06/13/19 and initiation of daptomycin twice with readmissions for trouble adhering to outpatient infusion therapy.    24 Hour - Interval Events   Unable to attain MRI thoracic/lumbar spine due to patient refusal    Subjective   - States that he has no recollection of MRI last night    Physical Exam   Temp  Min: 97.6 F (36.4 C)  Max: 98.2 F (36.8 C)  Pulse  Min: 64  Max: 110  BP  Min: 98/66  Max: 118/82  Resp  Min: 16  Max: 18  SpO2  Min: 95 %  Max: 99 %    BP 100/59 (BP Location: Left arm, BP Patient Position: Semi-Fowlers)   Pulse 89   Temp 98.2 F (36.8 C)   Resp 18   Ht 5\' 7"  (1.702 m)   Wt 72.6 kg (160 lb)   SpO2 97%   BMI 25.06 kg/m  O2 Device: None (Room air)      No intake/output data recorded.  Urine x 0 Stool x 0 Emesis x 0     GENERAL EXAM: NAD, well-developed, well-nourished  Psych: Pressured speech  Extremities: warm, no peripheral edema    NEUROLOGIC EXAM:  Mental Status: Awake, alert, attentive. Fluent, conversant but rapid speech, interactive with poor eye contact. Normal language output and comprehension    Motor  Normal bulk and tone  UPPER EXT  Deltoids  Infrasinatus  Biceps  Triceps  Wrist extensors  Wrist flexors  FDI  ADM  APB  Finger extensors  FDP median  FDP unlar R/L  *  *  *  *  *  *  *  *  *  *  *  * LOWER EXT  Iliopsoas  Hip abductors  Hip adductors  Quadriceps  Hamstrings  Tibialis anterior  Gastroc   Foot eversion  Foot inversion  EHL R/L  3/4  *  *  *  5/5  5/5  4/4  *  *  *       * Not tested    Reflexes  Patellar 2/2  Achilles 2/2  2-3 beats clonus with L ankle reflex    Somatosensory  Patchy areas of numbness over RLE, worse distally and over thigh    Pertinent Labs and Imaging and Meds     WBC 8.7 (08/23) HGB 13.0* (08/23)  PLT 227 (08/23)    HCT 40.1 (08/23)      Na 133* (08/23) CL 97* (08/23) BUN 11 (08/23) GLU   111* (08/23)   K 4.2 (08/23) CO2 24 (08/23) Cr 0.92 (08/23)      Scheduled Medications  . DAPTOmycin (CUBICIN) IVPB  8 mg/kg Q24H NR   . sodium chloride  3 mL Q8H     Continuous Medications  . sodium chloride       PRN Medications  . morphine  2 mg Q4H PRN   . nalOXone  0.1 mg Q2 Min PRN   . oxyCODONE  10 mg Q4H PRN   . oxyCODONE  5 mg Q4H PRN   . oxyCODONE  5 mg Q4H PRN   . sodium chloride  3 mL PRN   . sodium chloride   Continuous PRN  Assessment / Plan   Brian Ritter is a43 year oldmale with hx ofankylosing spondylitis, Ehler's Danlos, autism, and recent spinal fusion surgery c/b postop seroma with MRSA s/p I&D 06/13/19 and initiation of daptomycin twice with readmissions for trouble adhering to outpatient infusion therapy.    #MRSA T5-L3 seroma infection  Worsening back pain, subjective fevers, and RLE pain/weakness/numbness likely 2/2 seroma infection from prior spinal surgery, which has not been adequately treated with abx due to difficulty with transportation to infusion clinic. Growing concern for abx resistance since he has started daptomycin twice for MRSA seroma, and has not successfully completed regimen. Will likely require 6 weeks IV abx, which will be a challenge as his partner relies on him for medical care and he lives in a camper [facility] which is currently not functioning.  - Ortho consulted, do not recommend emergent surgical management  - ID consult, appreciate recs regarding choice/duration of abx considering recent nonadherance to daptomycin  - Continue daptomycin IV 600mg  qday  - Neuro checks q4h  - MRI lumbar and thoracic spine - rule out cauda equina syndrome, evaluate seroma  - F/u BCx  - Pain control: oxycodone 5-10mg  PO PRN  - SW for dispo planning  - NPO pending MRI results    Discharge planning: placement and clinical improvement  Foley: No foley catheter  VTE prophylaxis: LMWH  Diet: Diet  NPO; Yes; Patient NPO for procedure; possible ortho procedure  Last BM: yesterday  IV fluids: No IV fluids  Access: PIVs  Code status: Full Code    Patient care was discussed in detail with Othelia PullingAsghar, Ali Jr., MD.

## 2019-07-04 NOTE — ED Floor Report (Signed)
ED to IP Handoff    Report created by Polly Cobiahristina Marie Ardice Boyan, RN at 4:16 AM 07/04/2019.     HANDOFF REPORT UPDATE/CHANGES (changes in patient status/care/events prior to transfer)  By who:  Time:   Additional information:                                                                                                                                                     Brian Ritter is a 43 year old male.    Brief Summary of ED Visit (to include focused assessment and neuro status):  43 yo with suspected infected seroma form t5 to l3 and hardware s/p spinal fusion surgery meant to be on dapto here for worsening le weakness and missing dapto for 1 week.  Patient endorsing worsening back pain. One episode of incontinence 5 days ago. Has not been able to get dapto for 1 week.        RN shift assessment exceptions to WDL: unable and/or refusing to ambulate     Any significant events and interventions with responses:  Pain control 12mg  morphine, 2mg  ativan    Radiologic studies not completed: MRI- Pt refused to be moved to MRI table   (None unless otherwise noted)    Chief Complaint   Patient presents with   . Back Pain     Pt BIBA S/P Surgery in July for herniated disk, Contracted MRSA after surgery. Pt hospitalized last week at [facility]. Pt reports pain since admit and has gotten worse since being DC. Increased numbness/ tingling in Right leg.        Admitted for: Back pain    Code Status:  Please refer to In-pt admitting doctors orders     Level of Care: Med-Surg     Is patient septic? no If yes, complete below:    BC x 2 drawn? no  If No explain:  NA    Repeat lactate needed? no  If Yes, when is it due?  NA    All initial antibiotics given?  yes  If No, explain:  A    Amount of IV fluids received 0 ml    Is patient on Heparin? no If yes, complete below:     Time Heparin bolus was given: NA    Additional drips patient is on: NA    Cardiac rhythm: SR     Oxygen Delivery: None    No past medical history on file.    No past  surgical history on file.    Allergies: Wound dressing adhesive    ED Fall Risk: No    Skin issues:  no    >> If yes, note areas of skin breakdown. See appropriate photos.      Ambulatory:  no    Sitter needed: no    CSSRS Suicide Risk Level  Screening in Triage: Minimal Risk    Does this patient have a history of aggressive behavior and/or is this patient a green banner patient? no    Isolation Required: no     >> If yes , what type of isolation: NA    Is patient in custody?  no    Is patient in restraints? no    Swallow Screen:      Swallow Screen Result:      Was the patient made NPO & a Speech Language Pathology Consult Ordered? yes    Vitals:    07/04/19 0130 07/04/19 0230 07/04/19 0305 07/04/19 0341   BP: 115/68 112/78 116/70 106/74   BP Location:   Left arm    BP Patient Position:   Supine    Pulse:   83 69   Resp:   18 18   Temp:   97.8 F (36.6 C)    SpO2:   97% 95%   Weight:       Height:           Tubes Collected: Green PST on Ice (07/04/19 8099 : Dareen Piano, RN)    Lab Results   Component Value Date    WBC 8.7 07/03/2019    RBC 4.41 (L) 07/03/2019    HGB 13.0 (L) 07/03/2019    HCT 40.1 07/03/2019    MCV 90.9 07/03/2019    MCHC 32.4 07/03/2019    RDW 14.6 (H) 07/03/2019    PLT 227 07/03/2019    MPV 9.3 (L) 07/03/2019       Lab Results   Component Value Date    NA 133 (L) 07/03/2019    K 4.2 07/03/2019    CL 97 (L) 07/03/2019    BICARB 24 07/03/2019    BUN 11 07/03/2019    CREAT 0.92 07/03/2019    GLU 111 (H) 07/03/2019    [place] 8.9 07/03/2019       Lab Results   Component Value Date    LACTATE 1.7 07/03/2019       No results found for: CPK, CKMBH, TROPONIN    No results found for: PH, PCO2, O2CONTENT, IVHC3, IVBE, O2SAT, UNPH, UNPCO2, ARTPH, ARTPCO2, ARTO2CNT, IAHC3, IABE, ARTO2SAT, UNAPH, UNAPCO2    No results found for this visit on 07/03/19.      Patient Lines/Drains/Airways Status    Active PICC Line / CVC Line / PIV Line / Drain / Airway / Intraosseous Line / Epidural Line / ART Line / Line  Type / Wound     Name: Placement date: Placement time: Site: Days:    PICC Single Lumen - 83/38/25 Right Basilic  05/39/76   7341   Basilic  11    Peripheral IV - 20 G Left Forearm  07/04/19   0030   Forearm  less than 1                    ED Handoff Report is ready for review.  Admitting RN may reach Emergency Department RN, Dareen Piano, RN, at [phone] with any questions.

## 2019-07-04 NOTE — ED Notes (Signed)
Pt to MRI

## 2019-07-04 NOTE — H&P (Signed)
MEDICINE ADMISSION HISTORY & PHYSICAL    CC:  Back pain, leg weakness and numbness    HPI:  43 year old male with PMH of Ehlers Danlos, Ankylosing Spondylitis, and recent thoracolumbar spinal fusion c/b MRSA seroma infection with incomplete treatment who presents for worsening leg weakness.     Patient suffered a herniated disc after back trauma in June 2020 that was fixed at OSH with a thoracic laminectomy, resection of herniated discs and spine fusion on 05/23/2019. On 06/13/2019, he was found to have a MRSA infected seroma that was then drained and debrided 06/13/2019. He was started on a 6 week course of daptomycin (per sensitivities) but left AMA prior to PICC line placement.   Patient was then admitted to [facility] from 8/10-8/13 for worsening lower extremity symptoms. At that time, repeat images did not show worsening infection so no surgical intervention was required. He was continued on 6 week course of IV daptomycin for which he had agreed to present to infusion clinic daily.   After discharge, patient was again lost to follow up. Today, he reports that he has been off of his daptomycin for about 1 week due to transportation issues. He presents today because of 5 days of worsening lower extremity pain, numbness, and tingling and worsening back pain.   Back pain is specifically over surgical thoracic and lumbar areas. He denies any radiation of the back pain down to his lower extremities. Denies saddle anesthesia. Had one episode of bowel and bladder incontinence 4-5 days ago but has since not had further episodes.   Reports R>L lower extremity numbness and tingling. Has also had worsening weakness to both, largely due to sensation changes. No new trauma or signs of infection to the area.   Has had one day of chills and nausea but denies fevers. One week ago, had 3 days of multiple episodes of watery diarrhea that has since resolved. No blood.   Otherwise, no other illnesses, no sick contacts, or travel  outside of [facility].       ED Course: Daptomycin x1, morphine 80m x1. Ortho consulted, no acute surgical management but recommended repeat MRI     PMH/PSH:  Ehler Danlos, Ankylosing Spondylitis, autism  No past medical history on file.    Family Hx:  HTN and DM  No family history on file.    Social Hx:  Lives with boyfriend in trailer.   Denies regular etoh use  Smokes tobacco pipe weekly.   Denies current or prior drug use  Social History     Socioeconomic History   . Marital status: Single     Spouse name: Not on file   . Number of children: Not on file   . Years of education: Not on file   . Highest education level: Not on file   Occupational History   . Not on file   Social Needs   . Financial resource strain: Not on file   . Food insecurity     Worry: Not on file     Inability: Not on file   . Transportation needs     Medical: Not on file     Non-medical: Not on file   Tobacco Use   . Smoking status: Not on file   Substance and Sexual Activity   . Alcohol use: Not on file   . Drug use: Not on file   . Sexual activity: Not on file   Lifestyle   . Physical activity  Days per week: Not on file     Minutes per session: Not on file   . Stress: Not on file   Relationships   . Social Product manager on phone: Not on file     Gets together: Not on file     Attends religious service: Not on file     Active member of club or organization: Not on file     Attends meetings of clubs or organizations: Not on file     Relationship status: Not on file   . Intimate partner violence     Fear of current or ex partner: Not on file     Emotionally abused: Not on file     Physically abused: Not on file     Forced sexual activity: Not on file   Other Topics Concern   . Not on file   Social History Narrative   . Not on file       Meds:  Patient denies taking any regular medications  No current facility-administered medications on file prior to encounter.      Current Outpatient Medications on File Prior to Encounter      Medication Sig Dispense Refill   . [EXPIRED] acetaminophen (TYLENOL) 325 MG tablet Take 2 tablets (650 mg) by mouth every 8 hours as needed for Mild Pain (Pain Score 1-3) for up to 10 days. 60 tablet 0   . DAPTOmycin (CUBICIN) 600 mg in sodium chloride 100 mL IVPB Inject 600 mg into vein every 24 hours Indications: MRSA seroma.         Allergies:    Allergies   Allergen Reactions   . Wound Dressing Adhesive Hives       ROS:   Constitutional: see HPI  Eyes: Negative for blurry vision, loss of vision  ENT: Negative for hearing loss, ear pain, difficulty swallowing  CV: Negative for chest pain, palpitations, LE edema  Respiratory: Negative for SOB, DOE, cough  GI: see HPI  GU: see HPI  MSK: see HPI  Neuro: Negative for headaches, dizziness, lightheadedness  Psych: Negative for depressed mood, anxiety, difficulty sleeping    Vitals    BP 102/62 (BP Location: Left arm, BP Patient Position: Lying right side)   Pulse 92   Temp 97.6 F (36.4 C)   Resp 16   Ht 5' 6"  (1.676 m)   Wt 72.6 kg (160 lb)   SpO2 96%   BMI 25.82 kg/m     Physical Exam    Gen: gentleman resting comfortably on his side but in visible pain with physical exam, A&Ox3  HEENT: MMM, OP clear without erythema or exudates, no cervical LAD  CV: RRR, no m/r/g  Pulm: CTAB, no w/r/r  Abd: Soft, ND, NT, +BS, no masses, no organomegaly  Ext: WWP, no edema  Skin: surgical scar over thoracic and lumbar spine. Area is erythematous, no drainage, slightly increased warmth. Tender to palpation.   Neuro: CN II-XII grossly intact, Upper extremities with passive flexion and extension at shoulder, elbow and wrist. Patient declining strength exam due to generalized pain UE. LE decreased sensation bilaterally to light touch bilaterally, Strength on LLE 5/5 in ankle plantar flexion, 4/5 in dorsiflexion, 3/5 to knee flexion and hip flexion. Strength in RLE 4/5 in ankle plantar flexion; 3/5 to knee flexion and hip flexion. Patient declined rectal exam or reflexes due to  pain.     Labs  Recent Labs     07/03/19  2331  NA 133*   K 4.2   CL 97*   BICARB 24   BUN 11   CREAT 0.92   [place] 8.9     Recent Labs     07/03/19  2331   WBC 8.7   HGB 13.0*   HCT 40.1   MCV 90.9   PLT 227   SEG 61   LYMPHS 24   MONOS 12   EOS 3     No results for input(s): PTT, INR in the last 72 hours.    Lactate 1.7-->1.2  CRP 5.32 (3.38 at last discharge)  ESR 44 (65 at last discharge)    Micro  Bcx: x2: pending      Imaging  MRI T and L spine 07/03/2019: pending    Ct T-spine With Iv Contrast    Result Date: 06/20/2019    Impression: IMPRESSION: Status post T8-L1 laminectomy. The at the right T11 pedicle screws extend medial to the pedicle into the spinal canal. Evaluation of the soft tissues is limited due to artifact from hardware. Multilevel degenerative changes are present.     Ct L-spine With Iv Contrast    Result Date: 06/20/2019    Impression: IMPRESSION: Status post T8-L1 laminectomy. The at the right T11 pedicle screws extend medial to the pedicle into the spinal canal. Evaluation of the soft tissues is limited due to artifact from hardware. Multilevel degenerative changes are present.     Mri Thoracic Spine Wo/w Iv Contrast 8/12    Impression: IMPRESSION: 1. Enhancement of the ventral cauda equina nerve roots, nonspecific. This may be related to thecal sac violation, such as with lumbar puncture. Please correlate with recent procedural history. If there has been no recent instrumentation, intrathecal infection cannot be excluded. Note: no definite purulent material or debris layering within the dependent portion of the thecal sac is seen. Other differential considerations for nerve root enhancement could include inflammatory processes such as Guillain-Barre, or other inflammatory or less likely neoplastic process.   2. Redemonstrated postoperative changes of posterior decompression and instrumented fusion from T8 to L1. Fluid collection in the midline subcutaneous tissues extending from T5 to L3  (approximate craniocaudal extent of 27 cm) without definite extension to the epidural space or central spinal canal. This could represent either a sterile or infected postoperative fluid collection, which cannot be distinguished based on imaging features. If there is concern for infection, fluid sampling should be performed.   3. No abnormal intrathecal enhancement within the thoracic spine.   4. Multilevel degenerative changes without high-grade central spinal canal stenosis in either the thoracic or lumbar spine, within the limitations of metallic artifact from the patient's spinal fusion hardware. Recommend uploading prior images for comparison of interval change.  Important but non urgent findings/recommendations delineated above were verbally communicated by Dwaine Deter to the first-call care provider, [name], at 14:15 on 06/22/2019.   CTRM:2003:verbal.         Assessment & Plan  43 year old male with PMH of Ehler Danlos, Ankylosing Spondylitis, and recent thoracolumbar spinal fusion c/b MRSA seroma infection with incomplete treatment who presents for worsening leg weakness.     #worsening LE weakness and numbness, c/f cauda equina syndrome Patient's previous imaging does show extension of seroma into a region that could be compressing the cauda equina. Patient is reporting inconsistent symptoms and therefore favor that pain and neuro findings are due to existing infection/inflammation rather than new, acute compression. Ortho has consulted and we will follow up stat  MRI to r/o cauda equina.   - ortho consulted, no emergent surgical management at this time  - f/u stat MRI L and T spines  - q4 neuro checks  - pain control: oxy 35m q4PRN moderate pain, [id number] for severe pain; morphine 67m59m4hrs for breakthrough pain     # MRSA Seroma infection (T5-L3 extension on 8/13 imaging)- Patient has now been lost to follow up twice with his extended antibiotic course. Will discuss with ID and social work about  ways to optimize chances of finishing abx course  - continue daptomycin [id number]  - ID consult for course duration given 1 week gap in abx and outpatient abx  - f/u repeat Bcx  - f/u MRI as above  - SW consult for disp planning    # FEN - NPO pending MRI results and decision on ortho procedure  # PPX -  held pending possible procedure  # Code -  History- FC    # Dispo - pending improved neuro function and safe outpatient antibiotic course.     Patient discussed with attending (Dr. AsghReather Littlerho agrees with the plan above.    PoojCecilie Kicks  Internal Medicine/Pediatrics, PGY4

## 2019-07-04 NOTE — ED Notes (Signed)
Pt returned from MRI. Tech notified this RN that patient was refusing to be transferred onto MRI table. Dr. Cornelia Copa aware that patient returned to ED exam room.

## 2019-07-04 NOTE — Discharge Summary (Signed)
Date of Admission:  07/03/2019  Date of Discharge:  07/25/2019    Patient Name:  Brian Ritter    Principal Diagnosis (required):  Spinal seroma MRSA infection    Hospital Problem List (required):  Active Hospital Problems    Diagnosis   . *Seroma of nervous system after nervous system procedure [G97.63]   . Back pain [M54.9]   . Ankylosing spondylitis (CMS-HCC) [M45.9]   . Wound infection with MRSA [T14.8XXA, L08.9]      Resolved Hospital Problems   No resolved problems to display.     Additional Hospital Diagnoses ("rule out" or "suspected" diagnoses, etc.):  MRSA bacteremia  Chronic hepatitis c  HFrEF  Superficial thrombophlebitis   LE neuropathic pain  SVT with abberancy     Principal Procedure During This Hospitalization (required):  IV Vancomycin for MRSA seroma     Other Procedures Performed During This Hospitalization (required):  TTE 07/06/19  Suture removal 07/06/19  MRI lumbar and thoracic spine  PICC placement     Procedure results are available in Chart Review in Epic.  For those providers external to [facility], the key procedure results are listed below:    MRI lumbar and thoracic spine w/ and w/o contrast 07/04/19  IMPRESSION:  1. Enhancement and incomplete fat suppression within the dorsal epidural fat from T6-T8, is concerning for infection/phlegmon within the epidural space; no visible epidural abscess within the limits of hardware artifact. Continued paraspinal inflammation and bilateral pleural effusions, without visible posterior mediastinal/pleural/retroperitoneal abscess. Dr. Dionisio Paschal was paged to alert him of findings at 1730 hours on 07/04/2019  2. Persistent mild enhancement of the ventral cauda equina nerve roots as seen on prior MRI 06/21/2019  3. Redemonstration of posterior decompression and instrumented fusion from T8-L1 with interval decrease in the size of a postoperative fluid collection in the midline subcutaneous tissues.  4. Multilevel degenerative changes without high-grade central canal  stenosis in either the thoracic or lumbar spine, within the limitations of metallic artifact from the patient's spinal fusion hardware.    MRI thoracic and lumbar spine, 07/23/19:  IMPRESSION:  Exam is degraded by motion and susceptibility artifacts from spinal metallic hardware.    Redemonstration of postsurgical changes related to T8-L1 posterior decompression and instrumented fixation. Susceptibility artifact degrades evaluation of adjacent structures.    Redemonstration of incomplete fat suppression within the dorsal epidural space from T6-T8 that is slightly less conspicuous on the current study, though this may be due to technical differences. Findings may reflect mild edema and/or phlegmonous change. Extension inferiorly beyond T8 cannot be evaluated due to hardware susceptibility artifact. No well-defined epidural fluid collection is identified in the evaluable regions, within technical limits.    Redemonstration of lower cauda equina nerve root enhancement which appears slightly more prominent in the distal conus on axial imaging, though similar on sagittal imaging. Finding is thus likely related to technical differences, though slight interval progression cannot be completely excluded.    Interval elongation of a thin subcutaneous fluid collection that now extends from T6-L2 (previously T11-L3) with T6-T7 component measuring up to 6 mm AP. Conversely, the inferior component at L2 has decreased in size and now measures 7 mm AP (previously 10 mm). As such, change in appearance is likely related to redistribution, although a slight interval increase or decrease cannot be excluded. Findings are favored to reflect a postsurgical seroma, noting sterility cannot be determined by MRI.  Similar multilevel degenerative changes in the thoracic and lumbar spine. Please refer to prior MR spine  reports for further details.    TTE 07/06/19  Component Value Ref Range & Units Status Lab   LV Ejection Fraction 38  % Final  SYNGO     Summary:   1. No clear vegetations appreciated on this study. If strong clinical suspicion for endocarditis consider transesophageal echocardiogram.   2. The left ventricular size is mildly increased. The left ventricular systolic function is moderately depressed.   3. The right ventricular size is normal and systolic function is normal.   4. No significant valvular abnormalities.   5. No previous study for comparison.   6. Primary team was notified of results.    Consultations Obtained During This Hospitalization:  Infectious disease  Orthopedic spine  Neurosurgery  Cardiology     Key consultant recommendations:    Infectious disease:  - STOP IV Daptomycin  - Start IV Vancomycin per pharmacy today, goal trough 15-20, for total 6 week course    Neurosurgery:  Recommendations:  - No need for neurosurgical intervention at this time  - Activity as tolerated with assistance    Ortho spine:  [name] is a43 year oldmale who presents with new, acute onset bilateral lower extremity nonmyotomal weakness that is overcome with graded resistance on exam. He reports nondermatomal sensation loss in the setting of recent spine I+D on 8/3 after which he refused/was unable to continue with recommended outpatient IV abx. Area of peri-incisional swelling demonstrates no superficial gross evidence of infection, likely still suture granuloma/inflammatory reaction as he has nylon sutures in place from prior procedure. Repeat MRI demonstrates interval decrease in fluid collection size and again post surgical changes without hardware complication or stenosis/cord edema. He was reported to have epidural phlegmon vs thickened fat T6-T8 that is not obviously appreciated or correlated clinically. His + BCx is likely a contaminant, as he is not septic or objectively showing signs of blood born infection. Given his stable vitals, normal WBC, down trending ESR and non physiologic numbness without objective weakness, there is no  urgent indication for repeat I&D.  Plan/Recs:  - Treatment Plan: No urgent indication for surgical intervention.   - Antibiotics per primary team/ID  - Activity: Activity as tolerated with assistance  - Immobilization: Brace when out of bed.  - VTE ppx: Per primary  - Antibiotics:Per primary. It is critical the patient receives his IV dapto for a full course as previously recommended by ID.   - Pain control:multimodal pain control with PO meds, minimize IV   - Imaging: no further  - Diet:Per primary    Cardiology:  43 year old male with polysubstance abuse, spinal seroma MRSA infection, cauda equina syndrome, and systolic heart failure who had a narrow complex tachycardia overnight.  He was normotensive and asymptomatic during the event.  The telemetry was reviewed and he had 2 episodes of wide complex tachycardia around 0330 am.  One lasted 4 seconds and the other was 26 seconds.  Each had a rate of 150 bpm and each had no identifiable A-V dissociation.  There were no capture or fusion beats.  The EKG was without evidence of an accessory pathway or a prolonged QTc interval.  His echo was reviewed and he does have a depressed EF of 38% however no significant LV diameter change.  There also was concern for possible drug ingestion prior to the event however there is no knowledge of what substance it was.  Overall, he has structural changes to his heart likely from polysubstance abuse that place him at  a higher likelihood of ventricular arrhythmias.  Would recommend:  1)  Start coreg 3.125 mg bid both for arrhythmia/ectopy suppression and management of a depressed EF.  Consider adding an ACE-I/ARB as well.  2)  Keep K > 4, Mg > 2  3)  We will run the rhythm by our EP specialists    Reason for Admission to the Hospital / History of Present Illness:  43 year old male with PMH of Ehlers Danlos, Ankylosing Spondylitis, and recent thoracolumbar spinal fusion c/b MRSA seroma infection with incomplete treatment who  presents for worsening leg weakness.     Patient suffered a herniated disc after back trauma in June 2020 that was fixed at OSH with a thoracic laminectomy, resection of herniated discs and spine fusion on 05/23/2019. On 06/13/2019, he was found to have a MRSA infected seroma that was then drained and debrided 06/13/2019. He was started on a 6 week course of daptomycin (per sensitivities) but left AMA prior to PICC line placement.   Patient was then admitted to [facility] from 8/10-8/13 for worsening lower extremity symptoms. At that time, repeat images did not show worsening infection so no surgical intervention was required. He was continued on 6 week course of IV daptomycin for which he had agreed to present to infusion clinic daily.   After discharge, patient was again lost to follow up. Today, he reports that he has been off of his daptomycin for about 1 week due to transportation issues. He presents today because of 5 days of worsening lower extremity pain, numbness, and tingling and worsening back pain.   Back pain is specifically over surgical thoracic and lumbar areas. He denies any radiation of the back pain down to his lower extremities. Denies saddle anesthesia. Had one episode of bowel and bladder incontinence 4-5 days ago but has since not had further episodes.   Reports R>L lower extremity numbness and tingling. Has also had worsening weakness to both, largely due to sensation changes. No new trauma or signs of infection to the area.   Has had one day of chills and nausea but denies fevers. One week ago, had 3 days of multiple episodes of watery diarrhea that has since resolved. No blood.   Otherwise, no other illnesses, no sick contacts, or travel outside of [facility].     Hospital Course by Problem (required):    #MRSA Seroma Infection  #Sepsis  #PICC Line Infection  On admission, worsening back pain and RLE pain/weakness/numbness concerning for persistent seroma infection from prior spinal surgery.  In the ED, he was restarted on daptomycin and infectious disease and ortho spine were consulted. MRI showed improved seroma, and ortho recommended no surgical intervention. Exam and MRI reassuring against cauda equina syndrome. Presence of MRSA in blood cx was concerning for bacteremia with possible seeding of spinal hardware, so neurosurgery was consulted to evaluate. Given that WBC WNL, temperature only once to 100.3, and repeat BCx were negative at that time, neurosurgery recommended no surgical intervention. TTE negative for endocarditis. Since patient had been lost to f/u while on daptomycin twice in the past month, there was concern that he would not have the means to complete 6 week course of IV abx as an outpatient. He declined SNF placement and inpatient hospitalization was continued for extended course of IV antibiotics. ID recommended discontinuing daptomycin given extended course and starting IV vancomycin on 8/28 for total 6 week course, from first negative blood culture on 07/05/2019. Patient initially had interval decrease  in seroma to T11-L3 level. However, repeat MRI lumbar/thoracic spine on 07/23/19 with noted superior extension of seroma to T6-L2 level, measuring approximately 24m AP (previously 10 mm). Patient endorsing worsening back numbness, but otherwise without neurologic changes. Re-consulted neurosurgery at that time, who again did not feel I&D was necessary given overall very small size of seroma.    Patient subsequently septic with fever and tachycardia. PICC cultures found to be growing gram positive cocci and gram positive rods, not yet speciated at time of discharge. On day of discharge, nursing staff found syringe in patient's room with sediment; patient admitted to injecting crushed oxycodone and ativan through PICC this hospitalization. PICC removed and continued vancomycin with addition of zosyn through PIV with improvement in patient's vitals. At this time patient became agitated  and verbally confrontational with staff and subsequently left hospital AMA before further medical work-up, including echocardiogram to evaluate for possible endocarditis, could be completed, [facility] Medical team informed patient of risks of leaving hospital including worsening infection, sepsis and death and patient verbalized understanding.     #Arrhythmia, likely SVT with abberancy  While on cardiac monitors for sepsis one day prior to discharge, patient with noted 30 second run of arrhythmia. Initial concern for VT, but consulted cardiology who thought SVT with abberancy more likely. Of note, patient with noted HFrEF this admission (EF 38%). He refused subsequent echocardiogram and EKG prior to discharge. Cardiology recommended start coreg 3.125 mg BID for arrhythmia/ectopy suppression and management of depressed EF, however patient left AMA before this could be trialed.     #Superficial Thrombophlebitis  On hospital day #3, patient had sudden onset pain around his IV site while receiving Daptomycin. Over the next day, the skin became indurated, erythematous, and tender. No signs of compartment syndrome or infection. PICC was placed. Managed with warm compress and diclofenac cream with improvement in symptoms.    #RLE Discomfort  Described intermittent tingling, burning discomfort of RLE likely 2/2 herniated disc seen on imaging. Managed with gabapentin titrated to dose of 600 mg TID with some improvement in symptoms.     #Elevated Liver Enzymes, Chronic hepatitis C  Chronically elevated liver enzymes. Has history of outside lab work with positive hepatitis c ab and elevated viral load. Stable this admission. Outpatient hepatology follow-up.     Tests Outstanding at Discharge Requiring Follow Up:  PICC line culture, 07/23/19 - growing gram positive rods, gram positive cocci in pairs and chains  Peripheral blood cx, 07/23/19 - no growth after 48 hrs     Discharge Condition  (required):  Poor.    Key Physical Exam Findings at Discharge:  Mental Status Exam: Patient is alert and oriented to person, place, time, and situation.  Physical examination is significant for:  mild decrease in sensation to light touch in right leg which he says is normal since admission.    Discharge Diet:  Regular.    Discharge Medications:     What To Do With Your Medications      STOP taking these medications    acetaminophen 325 MG tablet  Commonly known as: TYLENOL     DAPTOmycin (CUBICIN) 600 mg in sodium chloride 100 mL IVPB     oxyCODONE 5 MG immediate release tablet  Commonly known as: ROXICODONE            Allergies:  Allergies   Allergen Reactions   . Wound Dressing Adhesive Hives       Discharge Disposition:  Left hospital against medical advice.    Discharge Code Status:  Full code / full care  This code status is not changed from the time of admission.    Follow Up Appointments:    Scheduled appointments:  No future appointments.    For appointments requested for after discharge that have not yet been scheduled, refer to the Post Discharge Referrals section of the After Visit Summary.    Discharging 58 Contact Information:  [facility] operator at [phone].    50 minutes spent on the patients unit discussing disposition with patienta nd family over the phone and arranging further care.

## 2019-07-04 NOTE — Plan of Care (Signed)
Problem: Promotion of Health and Safety  Goal: Promotion of Health and Safety  Description: The patient remains safe, receives appropriate treatment and achieves optimal outcomes (physically, psychosocially, and spiritually) within the limitations of the disease process by discharge.    Information below is the current care plan.  07/04/2019 1503 by Stefani Dama Dizon, RN  Flowsheets  Taken 07/04/2019 1501  Guidelines: Inpatient Nursing Guidelines  Individualized Interventions/Recommendations #2 (if applicable): Provide patient pre-meds before MRI to reduce anxiety and clautrophobia.  Outcome Evaluation (rationale for progressing/not progressing) every shift: Patient alert and oriented at this time. Pt lethargic previously. Patient resting comfortably in the morning, but now that patient is more alert, patient anxious. Patient given ativan prior to MRI, but then pt requesting morphine IV. Will assess if patient will be able to tolerate MRI. Will continue to monitor.  Taken 07/04/2019 1456  Individualized Interventions/Recommendations #1: Cluster care to promote rest and reduce agitation.  Taken 07/04/2019 0800  Patient /Family stated Goal: none stated  07/04/2019 1500 by Stefani Dama Dizon, RN  Flowsheets  Taken 07/04/2019 1456  Guidelines: Inpatient Nursing Guidelines  Individualized Interventions/Recommendations #1: Cluster care to promote rest and reduce agitation.  Taken 07/04/2019 0800  Patient /Family stated Goal: none stated

## 2019-07-05 ENCOUNTER — Ambulatory Visit (HOSPITAL_BASED_OUTPATIENT_CLINIC_OR_DEPARTMENT_OTHER): Payer: Medicaid Other

## 2019-07-05 DIAGNOSIS — R7881 Bacteremia: Secondary | ICD-10-CM

## 2019-07-05 DIAGNOSIS — M4635 Infection of intervertebral disc (pyogenic), thoracolumbar region: Secondary | ICD-10-CM

## 2019-07-05 DIAGNOSIS — B9562 Methicillin resistant Staphylococcus aureus infection as the cause of diseases classified elsewhere: Secondary | ICD-10-CM

## 2019-07-05 LAB — CBC WITH DIFF, BLOOD
ANC-Automated: 3.6 10*3/uL (ref 1.6–7.0)
Abs Basophils: 0 10*3/uL
Abs Eosinophils: 0.4 10*3/uL (ref 0.0–0.5)
Abs Lymphs: 1.9 10*3/uL (ref 0.8–3.1)
Abs Monos: 0.9 10*3/uL — ABNORMAL HIGH (ref 0.2–0.8)
Basophils: 0 %
Eosinophils: 5 %
Hct: 39.2 % — ABNORMAL LOW (ref 40.0–50.0)
Hgb: 12.5 gm/dL — ABNORMAL LOW (ref 13.7–17.5)
Lymphocytes: 28 %
MCH: 29.1 pg (ref 26.0–32.0)
MCHC: 31.9 g/dL — ABNORMAL LOW (ref 32.0–36.0)
MCV: 91.2 um3 (ref 79.0–95.0)
MPV: 9.2 fL — ABNORMAL LOW (ref 9.4–12.4)
Monocytes: 13 %
Plt Count: 252 10*3/uL (ref 140–370)
RBC: 4.3 10*6/uL — ABNORMAL LOW (ref 4.60–6.10)
RDW: 14.6 % — ABNORMAL HIGH (ref 12.0–14.0)
Segs: 53 %
WBC: 6.9 10*3/uL (ref 4.0–10.0)

## 2019-07-05 LAB — UR DRUGS OF ABUSE SCREEN
Barbiturates Screen: NEGATIVE
Benzodiazepine Screen: NEGATIVE
Cocaine Screen: NEGATIVE
Methadone Screen: NEGATIVE
Phencyclidine Screen: NEGATIVE
THC Screen: NEGATIVE

## 2019-07-05 LAB — BASIC METABOLIC PANEL, BLOOD
Anion Gap: 12 mmol/L (ref 7–15)
BUN: 16 mg/dL (ref 6–20)
Bicarbonate: 25 mmol/L (ref 22–29)
Calcium: 9.2 mg/dL (ref 8.5–10.6)
Chloride: 95 mmol/L — ABNORMAL LOW (ref 98–107)
Creatinine: 0.99 mg/dL (ref 0.67–1.17)
GFR: >60 mL/min
Glucose: 102 mg/dL — ABNORMAL HIGH (ref 70–99)
Potassium: 4.2 mmol/L (ref 3.5–5.1)
Sodium: 132 mmol/L — ABNORMAL LOW (ref 136–145)

## 2019-07-05 LAB — CPK-CREATINE PHOSPHOKINASE, BLOOD: CPK: 129 U/L (ref 0–175)

## 2019-07-05 LAB — MRSA SURVEILLANCE CULTURE

## 2019-07-05 LAB — PHOSPHORUS, BLOOD: Phosphorous: 4.6 mg/dL — ABNORMAL HIGH (ref 2.7–4.5)

## 2019-07-05 LAB — MAGNESIUM, BLOOD: Magnesium: 2 mg/dL (ref 1.6–2.6)

## 2019-07-05 MED ORDER — POLYETHYLENE GLYCOL 3350 OR PACK
17.0000 g | PACK | Freq: Every day | ORAL | Status: DC
Start: 2019-07-05 — End: 2019-07-25
  Administered 2019-07-07 (×2): 17 g via ORAL
  Filled 2019-07-05 (×12): qty 1

## 2019-07-05 MED ORDER — SODIUM CHLORIDE 0.9 % IV SOLN
8.0000 mg/kg | INTRAVENOUS | Status: DC
Start: 2019-07-05 — End: 2019-07-08
  Administered 2019-07-05 – 2019-07-07 (×4): 600 mg via INTRAVENOUS
  Filled 2019-07-05 (×3): qty 600

## 2019-07-05 NOTE — Plan of Care (Signed)
Problem: Promotion of Health and Safety  Goal: Promotion of Health and Safety  Description: The patient remains safe, receives appropriate treatment and achieves optimal outcomes (physically, psychosocially, and spiritually) within the limitations of the disease process by discharge.    Information below is the current care plan.  Outcome: Progressing  Flowsheets  Taken 07/05/2019 0139 by Atilano Ina, RN  Individualized Interventions/Recommendations #2 (if applicable): Remind pt to call before getting OOB d/t BLE weakness  Outcome Evaluation (rationale for progressing/not progressing) every shift: Pt alert and oriented. VSS. Pt given 1 time dose of Dapto IV per MD. Need to consult ID in AM for further ABX reccomendations. Continue to monitor.  Taken 07/04/2019 2000 by Atilano Ina, RN  Patient /Family stated Goal: None stated  Taken 07/04/2019 1501 by Stefani Dama Dizon, RN  Guidelines: Inpatient Nursing Guidelines  Taken 07/04/2019 1456 by Stefani Dama Dizon, RN  Individualized Interventions/Recommendations #1: Cluster care to promote rest and reduce agitation.

## 2019-07-05 NOTE — Consults (Signed)
Infectious Diseases New Consult Note    Date of service: 07/05/19  Hospital day#:   1 day - Admitted on: 07/03/2019  Provider requesting consultation: Talbert Forest*    Reason for consultation: Patient unable to get proper dosing of abx, returning with MRSA bacteremia and ongoing infection of spinal hardware.     HPI: Brian Ritter is a 43 year old male with history of Ehler's Danlos syndrome, ankylosing spondylitis and mutliple spinal surgeries, who presented after recent discharge from [facility] with worsening spinal pain concerning for postoperative infection.  Patient was discharged on 8/13 with infusion center administration of daptomycin for seroma infection with concern for involvement of underlying spinal hardware. However, due to transportation issues, patient was unable to go to infusion center for antibiotics. Per chart review, patient has missed at least a week and a half of infusion center appointments. Patient reports that his camper broke down outside of [facility] and was unable to make appointments. He has been having worsening back pain and bilateral lower extremity weakness/numbness for the past 5 days now.     ANTIMICROBIALS:  Current:  Daptomycin 8 mg/kg q24 8/10-present    Prior:  Daptomycin 8/3 to 8/7    ROS: Per my usual practice, all systems were reviewed.  Notable findings listed above in the HPI.     PMH and SHX:  Ehler's Danlos  Ankylosing spondylisis  Thoracolumbar spinal fusion as above    MEDS (all relevant medications reviewed in EPIC and notable for):  No current facility-administered medications on file prior to encounter.      Current Outpatient Medications on File Prior to Encounter   Medication Sig   . [EXPIRED] acetaminophen (TYLENOL) 325 MG tablet Take 2 tablets (650 mg) by mouth every 8 hours as needed for Mild Pain (Pain Score 1-3) for up to 10 days.   Marland Kitchen DAPTOmycin (CUBICIN) 600 mg in sodium chloride 100 mL IVPB Inject 600 mg into vein every 24 hours Indications: MRSA  seroma.         ALLERGIES  Allergies   Allergen Reactions   . Wound Dressing Adhesive Hives       SOCIAL HISTORY:  Occupation:  Clinical research associate  Tobacco:  Former, quit after high school  Alcohol:  Denies  Recreational drugs:  Denies IV drug use  Sexual history:  Sexually active with 1 male partner  Exposures:  None  Living situation:  Lives out of RV    FAMILY HISTORY:  No family history of immunodeficiency disorders.    PHYSICAL EXAM   Temperature:  [98.3 F (36.8 C)-98.7 F (37.1 C)] 98.3 F (36.8 C) (08/25 0705)  Blood pressure (BP): (102-129)/(57-65) 102/65 (08/25 0705)  Heart Rate:  [61-88] 84 (08/25 0705)  Respirations:  [16-18] 16 (08/25 0645)  Pain Score: 8 (08/25 1120)  O2 Device: None (Room air) (08/24 1447)  SpO2:  [96 %-97 %] 97 % (08/24 2312)  GEN: Alert and oriented x3, NAD  EYES: refused exam  HENT: NCAT, MMM  NODES: no palpable LAD  CV: RRR, no murmurs  PULM: CTAB  BACK: Mid thoracic surgical.scar, well approximated. Slight erythema around margins, sutures remain in place  ABD: soft, nontender  EXT: no edema  SKIN: pale  NEURO: CN II-XII grossly intact, muscle strength equal bilat LE  PSYCH: does not make eye contact, withdrawn.     LABS (All pertinent labs reviewed in EPIC and notable for):  Recent Labs     07/03/19  2331 07/05/19  0552   WBC  8.7 6.9   SEG 61 53   LYMPHS 24 28   HGB 13.0* 12.5*   HCT 40.1 39.2*   PLT 227 252     Recent Labs     07/03/19  2331 07/05/19  0552   NA 133* 132*   K 4.2 4.2   CL 97* 95*   BICARB 24 25   BUN 11 16   CREAT 0.92 0.99   GLU 111* 102*   [place] 8.9 9.2   PHOS  --  4.6*   MG  --  2.0       CRP (mg/dL)   Date Value   16/10/960408/23/2020 5.32 (H)   06/24/2019 3.38 (H)   06/23/2019 0.59 (H)   06/20/2019 0.21     Sed Rate (mm/hr)   Date Value   07/03/2019 44 (H)   06/24/2019 65 (H)   06/20/2019 51 (H)       MICRO:  BLOOD:  8/23 Blood cultures 1/4 MRSA    Prior:  8/10 blood cultures x2 negative    At OSH:  7/28 body fluid culture-MRSA (S:  Daptomycin, clindamycin, rifampin,  tetracycline, TMP-SMX, vancomycin)  8/3 body fluid-MRSA) clindamycin, rifampin, tetracycline, TMP-SMX, Vanco)    IMAGING (personally reviewed by me and notable for):  8/24 MRI T and L-spine  1. Enhancement and incomplete fat suppression within the dorsal epidural fat from T6-T8, is concerning for infection/phlegmon within the epidural space; no visible epidural abscess within the limits of hardware artifact. Continued paraspinal inflammation and bilateral pleural effusions, without visible posterior mediastinal/pleural/retroperitoneal abscess. Dr. Richard MiuSeymann was paged to alert him of findings at 1730 hours on 07/04/2019.    2. Persistent mild enhancement of the ventral cauda equina nerve roots as seen on prior MRI 06/21/2019.    3. Redemonstration of posterior decompression and instrumented fusion from T8-L1 with interval decrease in the size of a postoperative fluid collection in the midline subcutaneous tissues.    4. Multilevel degenerative changes without high-grade central canal stenosis in either the thoracic or lumbar spine, within the limitations of metallic artifact from the patient's spinal fusion hardware.      Assessment and Plan:  Brian Ritter is a 43 year old male with history of Ehler's Danlos syndrome, ankylosing spondylitis and mutliple spinal surgeries, who presents after recent discharge with concern for worsening spinal infection.  ID is consulted regarding antibiotic choice. Unfortunately, patient has had social issues that made visiting infusion center difficult, thereby missing over a week's worth of antibiotics. It does appear that he now has MRSA bacteremia in addition to known hardware infection. MRI is also concerning for epidural infection, which appears to be new finding when compared with MRI from 8/11.     #MRSA bacteremia  #Thoracolumbar spinal infection with hardware in place  -Agree with continuing daptomycin for now, 600mg  daily  -Check CPK and follow weekly while on  daptomycin  -Given patient's untreated MRSA infection, this positive blood culture is likely reflective of true bacteremia. Recommend obtaining TTE to evaluate for vegetation  -Repeat blood cultures daily until negative  -Recommend draining phlegmon/epidural abscess seen on MRI. Appreciate ortho/spine assistance with this  -Recommend suture removal when feasible.     #Hx Drug use  #Social instability  -Recommend SW consult to discuss issues with transportation and getting to infusion center etc.     Recommendations discussed with the primary team.    Orlean Bradfordachel Kunstadter Sigler, DO MPH  Infectious Disease Fellow, PGY-4    Patient discussed with Dr. Loma Newtonowell  Attending Note  I reviewed the chart, and saw and examined the patient.  I have reviewed Dr. Bethanne Ginger note and agree with the findings and plan, which reflects our discussion.  43 yo with a prior thoracolumbar fusion c/b an infected seroma with MRSA, on treatment with daptomycin x 6 weeks but unable to go to the infusion center d/t lack of transportation per report and off antibiotics for about 1 week with worsening pain, MRI spine with progression of infection with possible epidural phlegmon but without drainable collections at this time.  Has rising inflammatory markers and MRSA bacteremia which is likely d/t his progressive infection.  Ortho spine following.  Incision appears well healed without erythema though he still has suture in place from prior surgery.    Reino Kent, MD MPH  Infectious Diseases Attending

## 2019-07-05 NOTE — Interdisciplinary (Signed)
Spoke to phlebotomy regarding blood cx that were ordered this morning.  I saw Cori Razor, the phlebotomist, go into the room this morning. Followed up with micro and they have not received the samples. Per Amy, they were not drawn and there were no notes in the system that the patient refused.  Amy said she would come up to draw labs.

## 2019-07-05 NOTE — Progress Notes (Signed)
MEDICINE DAILY PROGRESS NOTE     Hospital day:   1 day - Admitted on: 07/03/2019    Brian Ritter is a43 year oldmale with hx ofankylosing spondylitis, Ehler's Danlos, autism, and recent spinal fusion surgery c/b postop seroma with MRSA s/p I&D 06/13/19 and initiation of daptomycin twice with readmissions for trouble adhering to outpatient infusion therapy, now with BCx positive for MRSA.    24 Hour - Interval Events   One BCx grew GP cocci in clusters, MRSA DNA detected    Subjective   - Endorses 8/10 pain, states that he is not able to have rescue doses of oxycodone frequently enough    Physical Exam   Temp  Min: 98.1 F (36.7 C)  Max: 98.7 F (37.1 C)  Pulse  Min: 61  Max: 93  BP  Min: 102/65  Max: 129/57  Resp  Min: 16  Max: 18  SpO2  Min: 96 %  Max: 99 %    BP 102/65 (BP Location: Left arm, BP Patient Position: Semi-Fowlers)   Pulse 84   Temp 98.3 F (36.8 C)   Resp 16   Ht 5\' 6"  (1.676 m)   Wt 72.6 kg (160 lb)   SpO2 97%   BMI 25.82 kg/m  O2 Device: None (Room air)      08/24 0600 - 08/25 0559  In: 340 [P.O.:240; I.V.:100]  Out: 250 [Urine:250]  Urine x 1 Stool x 0 Emesis x 0     GENERAL EXAM: NAD, well-developed, well-nourished  Extremities: Warm, no peripheral edema    NEUROLOGIC EXAM:  Mental Status: Awake but sleepy, falls asleep mid-conversation. Fluent, conversant but rapid speech, interactive with poor eye contact. Normal language output and comprehension    Motor  Normal bulk and tone  UPPER EXT  Deltoids  Infrasinatus  Biceps  Triceps  Wrist extensors  Wrist flexors  FDI  ADM  APB  Finger extensors  FDP median  FDP unlar R/L  *  *  *  *  *  *  *  *  *  *  *  * LOWER EXT  Iliopsoas  Hip abductors  Hip adductors  Quadriceps  Hamstrings  Tibialis anterior  Gastroc   Foot eversion  Foot inversion  EHL R/L  3/4  *  *  *  *  5/5  4/4  *  *  *       * Not tested    Somatosensory  Patchy areas of numbness over RLE, worse distally and over thigh    Pertinent Labs and Imaging and Meds     WBC 6.9  (08/25) HGB 12.5* (08/25) PLT 252 (08/25)    HCT 39.2* (08/25)      Na 132* (08/25) CL 95* (08/25) BUN 16 (08/25) GLU   102* (08/25)   K 4.2 (08/25) CO2 25 (08/25) Cr 0.99 (08/25)      BCx: GP cocci in clusters, presence of MRSA DNA    MRI thoracic and lumbar spine w/ and w/o contrast 07/04/19  1. Enhancement and incomplete fat suppression within the dorsal epidural fat from T6-T8, is concerning for infection/phlegmon within the epidural space; no visible epidural abscess within the limits of hardware artifact. Continued paraspinal inflammation and bilateral pleural effusions, without visible posterior mediastinal/pleural/retroperitoneal abscess. Dr. Richard MiuSeymann was paged to alert him of findings at 1730 hours on 07/04/2019.    2. Persistent mild enhancement of the ventral cauda equina nerve roots as seen on prior MRI 06/21/2019.  3. Redemonstration of posterior decompression and instrumented fusion from T8-L1 with interval decrease in the size of a postoperative fluid collection in the midline subcutaneous tissues.    4. Multilevel degenerative changes without high-grade central canal stenosis in either the thoracic or lumbar spine, within the limitations of metallic artifact from the patient's spinal fusion hardware.    Scheduled Medications  . DAPTOmycin (CUBICIN) IVPB  8 mg/kg Q24H NR   . sodium chloride  3 mL Q8H     Continuous Medications  . sodium chloride       PRN Medications  . nalOXone  0.1 mg Q2 Min PRN   . oxyCODONE  10 mg Q4H PRN   . oxyCODONE  5 mg Q4H PRN   . oxyCODONE  5 mg Q4H PRN   . sodium chloride  3 mL PRN   . sodium chloride   Continuous PRN     Assessment / Plan   Brian Ritter is a89 year oldmale with hx ofankylosing spondylitis, Ehler's Danlos, autism, and recent spinal fusion surgery c/b postop seroma with MRSA s/p I&D 06/13/19 and initiation of daptomycin twice with readmissions for trouble adhering to outpatient infusion therapy, now with BCx positive for MRSA.    #MRSA T5-L3 seroma  infection  Worsening back pain, subjective fevers, and RLE pain/weakness/numbness likely 2/2 seroma infection from prior spinal surgery, which has not been adequately treated with abx due to difficulty with transportation to infusion clinic. Now likely c/b MRSA bacteremia, which raises concern for hardware infection.  - Ortho consulted, appreciate recs  - ID consult, appreciate recs regarding choice/duration of abx considering recent nonadherance to daptomycin  - Continue daptomycin IV 600mg  qday  - TTE  - Neuro checks q4h  - F/u BCx  - Pain control: oxycodone 5-10mg  PO PRN  - SW, CM consulted for dispo plan    Discharge planning: placement and clinical improvement  Foley: No foley catheter  VTE prophylaxis: ambulatory, hold VTE ppx while considering procedure  Diet: Diet Regular  Last BM: yesterday  IV fluids: No IV fluids  Access: PIVs  Code status: Full Code    [name], MSIV  Patient care was discussed in detail with Hazle Coca*.

## 2019-07-05 NOTE — Interdisciplinary (Addendum)
07/05/19 1227   Initial Assessment   CM Initial Assessment * Completed   Patient Information   Where was the patient admitted from? * Other (Comment)  (His [name]   Prior to Level of Function * Ambulatory/Independent with ADL's   Assistive Device * Not applicable   Primary Caretaker(s) * Self;Spouse/Partner   Primary Contact Name, Number and Relationship * emergency contact confirmed per face sheet.   Permission to [place] Unemployed   Financial Resources Other (Comment)  (MEDI-CAL CAL OPTIMA)   Discharge Planning   Living Arrangements * Other  (Lives with his partner Quillian Quince in their [place].)   Available Assistance/Support System * Spouse / significant other   Type of Residence * Other (Comment)  [name].)   Anticipated Discharge Dispostion/Needs Other  (To be determined.)   Patient's Discharge Goal(s) Home   Barriers to Discharge * Awaiting clinical improvement   Do you have difficulty affording your medications No   Patient/Family/Other Engaged in Discharge Planning * Yes   Patient Has Decision Making Capacity * Yes   Patient/Family/Legal/Surrogate Decision Maker Has Been Given a List Options And Choice In The Selection of Post-Acute Care Providers * Not Applicable   Family/Caregiver's Assessed for * Readiness to provide care to the patient   Respite Care * Not Applicable   Patient/Family/Other Are In Agreement With Discharge Plan * To be determined   Public Health Clearance Needed * Not Applicable   Social Worker Consult   Do you need to see a Education officer, museum? * No   Readmission Risk Assessment   Readmission Within 30 Days of Discharge * Yes   Admission Was Unplanned   Patient Explanation  Got sicker   Family Explanation Got sicker   Additional Information On 30 Day Readmission Failed IVABX therapy at [facility] due to poor compliance and transport.   Recent Hospitalizations (Within Last 6 Months) * Yes   High Risk For Readmission * Yes   Action Taken To Prevent  Readmission After Discharge Not Applicable   Recommendations to the Physician Case management consult   MOON   MOON Provided to Patient Not Applicable       Met with patient, who was very sleepy during the visit and had to be wokern up several times and became more and more agitated.  Patient was discharged on 06/23/19 with the plan to go to [facility] infusion center for daily IV Daptomycin for a total of 6 weeks.  Per MD, patient has now failed 2 prior discharges with IV Daptomycin at [facility] in [place] Per patient its due to transport.He said he only made 2 visits.    Per MD, patient would not be a good candidate for another discharge due to prior failed attempts.  Patient would not be a good candidate for home health, due to non stationary van and poor compliance, as HH would teach the patient how to administer his own IVABX after a couple of teaching sessions.   Pt's tox screen on his previous admission was indicative of positive for meth.   Due to cost of IV Daptomycin, SNFs do not accept.  CM will still send SNF referral to see if any will accept with IV Daptomycin       CM confirmed that the address on the face sheet is only a possible mailing address and that patient and his partner live in their [name] which is the patient does not know where is  parked at this time. Thinks its in [place] but not sure.    PCP- Patient is from [place] and wants to move to [place]. Does not have a PCP at this time.  RX- [facility] in [place].

## 2019-07-05 NOTE — Interdisciplinary (Signed)
Acknowledge Nursing triggers for chewing:    Chew Trigger: Pt denied chewing difficulty; denied need for modified texture.     Diet: Diet Regular    PO Intake:   Tray Items Taken for the past 168 hrs:   Number of Items Taken Number of Items on Tray Diet Tolerance   07/04/19 1826 2 5 Tolerates     Food Allergies: NKFA    >Continue to provide regular texture per pt's request.    Relay pertinent information to RD. Proceed per plan of care.  Darrel Reach, DTR

## 2019-07-05 NOTE — Plan of Care (Signed)
Problem: Promotion of Health and Safety  Goal: Promotion of Health and Safety  Description: The patient remains safe, receives appropriate treatment and achieves optimal outcomes (physically, psychosocially, and spiritually) within the limitations of the disease process by discharge.    Information below is the current care plan.  Flowsheets  Taken 07/05/2019 1532  Guidelines: Inpatient Nursing Guidelines  Individualized Interventions/Recommendations #2 (if applicable): Provide patient with iPad for pt's entertainment.  Outcome Evaluation (rationale for progressing/not progressing) every shift:   Patient waxes and wanes between awake and lethargy. Although, pt more alert today. Patient oriented x 3-4. Patient resting comfortably in bed   however, has been agitated with MDs and some of ther nursing staff.  Pt has been pleasant with this Probation officer. Patient recieved iPad at bedside.  Will continue to monitor.  Taken 07/05/2019 0755  Patient /Family stated Goal: pain control  Taken 07/04/2019 1456  Individualized Interventions/Recommendations #1: Cluster care to promote rest and reduce agitation.

## 2019-07-05 NOTE — Consults (Signed)
Orthopaedic Surgery Consult Note - Spine    Current Hospital Stay:   1 day - Admitted on: 07/03/2019  07/05/19   11:11 AM    Requesting physician: Hazle Coca    History:  ID:  Brian Ritter   43 year old male  [id number]    Reason for Consult:     DOI: 09/15/2018    HPI:   43 year old male with a PMH of ankylosing spondylitis, autism, status post T8-L1 posterior decompression and fusion at [facility] in [place] with subsequent I+D on 8/3 for suspicious fluid collection.  Gross purulence found on I+D, prompting ID rec for 8wks IV Dapto which the patient never started due to living in a broken down [place] without transportation to infusion center. He re-presents with a history of multiple recurrent presentations to ED (8/10 and 8/18) for worsening pain and subjective weakness/fevers.Reports being able to ambulate without assistive devices. Endorses isolated bowel or bladder incontinence last week, as well as saddle anesthesia, fevers, and chills.    Reporting pain currently to back and bilateral legs    NPO status: this AM 11AM    PMH/PSH:   No past medical history on file.  No past surgical history on file.    Meds:   Prior to Admission Medications   Prescriptions Last Dose Informant Patient Reported? Taking?   DAPTOmycin (CUBICIN) 600 mg in sodium chloride 100 mL IVPB   No No   Sig: Inject 600 mg into vein every 24 hours Indications: MRSA seroma.   acetaminophen (TYLENOL) 325 MG tablet   No No   Sig: Take 2 tablets (650 mg) by mouth every 8 hours as needed for Mild Pain (Pain Score 1-3) for up to 10 days.   oxyCODONE (ROXICODONE) 5 MG immediate release tablet   No No   Sig: Take 1 tablet (5 mg) by mouth every 4 hours as needed for Moderate Pain (Pain Score 4-6) or Severe Pain (Pain Score 7-10) for up to 5 days.      Facility-Administered Medications: None       All: Wound dressing adhesive    FH: noncontributory    SH:    Social History     Socioeconomic History   . Marital status: Single     Spouse name: Not on file     . Number of children: Not on file   . Years of education: Not on file   . Highest education level: Not on file   Occupational History   . Not on file   Social Needs   . Financial resource strain: Not on file   . Food insecurity     Worry: Not on file     Inability: Not on file   . Transportation needs     Medical: Not on file     Non-medical: Not on file   Tobacco Use   . Smoking status: Not on file   Substance and Sexual Activity   . Alcohol use: Not on file   . Drug use: Not on file   . Sexual activity: Not on file   Lifestyle   . Physical activity     Days per week: Not on file     Minutes per session: Not on file   . Stress: Not on file   Relationships   . Social Product manager on phone: Not on file     Gets together: Not on file     Attends religious service: Not  on file     Active member of club or organization: Not on file     Attends meetings of clubs or organizations: Not on file     Relationship status: Not on file   . Intimate partner violence     Fear of current or ex partner: Not on file     Emotionally abused: Not on file     Physically abused: Not on file     Forced sexual activity: Not on file   Other Topics Concern   . Not on file   Social History Narrative   . Not on file     - Tobacco: Endorses daily pipe smoker  - EtOH: Endorses   - Illicits: Denies (despite + Utox)  - Lives in broken down [place] in [place] with boyfriend.  - Occupation: Unemloyed  - Prior level of function: Independent    Objective:    07/04/19  2330 07/05/19  0135 07/05/19  0645 07/05/19  0705   BP:    102/65   Pulse:    84   Resp: 18 16 16     Temp:    98.3 F (36.8 C)   SpO2:            Physical Exam:   Gen: NAD  HEENT: hearing and vision grossly intact, poor eye contact, rapid slurred speech  CV: RRR per peripheral pulses  Pulm: non labored breathing  Neuro: A&Ox3    Spine exam:  - Inspection: No gross deformities or obvious signs of trauma, skin with intact nylon sutures from early July procedure at [facility].  Significant post surgical erythema and swelling. No pustules, dehiscence, or drainage.   - Palpation: TTP of thoracic spinous processes with swelling but no fluctuance and no induration      Upper Extremity Motor Strength  Muscle Left/5 Right/5 Comment   Deltoid(C5) 5 5    Biceps (C5/6) 5 5    Wrist Ext (C6) 5 5    Triceps (C7) 5 5    Grip (C8) 5 5    Interossei (T1) 5 5      Upper Extremity Sensation to Light Touch  Nerve Left Right Comment   C5 Intact Intact    C6 Intact Intact    C7 Intact Intact    C8 Intact Intact    T1 Intact Intact      Upper Extremity Special Testing      Left   Right    Hoffman's:   Neg    Neg  Biceps Reflex:   2+   2+  Triceps Reflex   2+   2+      Lower Extremity Motor Strength  Muscle Left/5 Right/5 Comment   Hip flexion (L2) 5 4 Strength assessed with graded resistance    Knee extension (L3) 5 4.5    Tibialis anterior (L4) 5 5    Extensor hallucis (L5) 5 5    Gastrocsoleus (S1) 5 5      Lower Extremity Sensation to Light Touch  Nerve Left Right Comment   L2 Intact Absent    L3 Intact Absent    L4 Intact Absent    L5 Intact Absent    S1 Intact Absent      Lower Extremity Special Testing        Left   Right  Clonus    6 beats  6 beats   Babinski   Neg   Neg  Patellar Reflex:  2+   2+  Achilles Reflex:  2+   2+    Rectal tone: deferred  Perianal sensation: intact     Laboratory Data:  Lab Results   Component Value Date    WBC 6.9 07/05/2019    RBC 4.30 (L) 07/05/2019    HGB 12.5 (L) 07/05/2019    HCT 39.2 (L) 07/05/2019    MCV 91.2 07/05/2019    MCHC 31.9 (L) 07/05/2019    RDW 14.6 (H) 07/05/2019    PLT 252 07/05/2019    MPV 9.2 (L) 07/05/2019     Lab Results   Component Value Date    NA 132 (L) 07/05/2019    K 4.2 07/05/2019    CL 95 (L) 07/05/2019    BICARB 25 07/05/2019    BUN 16 07/05/2019    CREAT 0.99 07/05/2019    GLU 102 (H) 07/05/2019    [place] 9.2 07/05/2019     No results found for: INR, PTT      Imaging:  MRI T&L spine 07/04/19: on my read no significant canal narrowing or hardware  complication. Decreased size of prior fluid collection and no cord compression/edema. L5/S1 paracentral disk herniation.       Micro: x1 + BCX for MRSA    Assessment:  Brian Ritter is a 43 year old male who presents with new, acute onset bilateral lower extremity nonmyotomal weakness that is overcome with graded resistance on exam. He reports nondermatomal sensation loss in the setting of recent spine I+D on 8/3 after which he refused/was unable to continue with recommended outpatient IV abx. Area of peri-incisional swelling demonstrates no superficial gross evidence of infection, likely still suture granuloma/inflammatory reaction as he has nylon sutures in place from prior procedure. Repeat MRI demonstrates interval decrease in fluid collection size and again post surgical changes without hardware complication or stenosis/cord edema. He was reported to have epidural phlegmon vs thickened fat T6-T8 that is not obviously appreciated or correlated clinically. His + BCx is likely a contaminant, as he is not septic or objectively showing signs of blood born infection. Given his stable vitals, normal WBC, down trending ESR and non physiologic numbness without objective weakness, there is no urgent indication for repeat I&D.    Plan/Recs:  - Treatment Plan:  No urgent indication for surgical intervention.   - Antibiotics per primary team/ID  - Activity:  Activity as tolerated with assistance  - Immobilization:  Brace when out of bed.  - VTE ppx:  Per primary  - Antibiotics:  Per primary. It is critical the patient receives his IV dapto for a full course as previously recommended by ID.   - Pain control: multimodal pain control with PO meds, minimize IV   - Imaging:  no further  - Diet:  Per primary    Dispo/Follow up plan: Follow up with surgeon at outside hospital within 1-2 weeks for wound check, suture removal. May f/u with Ortho Spine  At [facility] if pain persists or weakness develops in the L5/S1 dermatome, or if  the patient were to clinically decompensate.     Patient discussed with on-call resident, Dr. Ilda Basset. The attending of record for this encounter is Dr. Zenia Resides.    Dorris Fetch, MD  [facility] PGY2    Please page the Orthopaedic Spine Surgery Team with questions or concerns based on patient location:     Established spine floor patients at [facility]: Ortho Spine 1 - [phone]      Established spine floor patients at [facility]: Ortho  Spine 2 - [phone]      New spine consultations not yet followed by spine team:    View [facility] on call for "SPINE CONSULT" call schedule (Orthopaedic Spine versus Neurosurgery)   If Orthopaedic Spine is on call please page the [facility] on call:   : ORTHOPEDICS/TH   [facility]: ORTHOPEDICS/HC

## 2019-07-06 ENCOUNTER — Ambulatory Visit (HOSPITAL_BASED_OUTPATIENT_CLINIC_OR_DEPARTMENT_OTHER): Payer: Medicaid Other

## 2019-07-06 DIAGNOSIS — I339 Acute and subacute endocarditis, unspecified: Secondary | ICD-10-CM

## 2019-07-06 LAB — BASIC METABOLIC PANEL, BLOOD
Anion Gap: 10 mmol/L (ref 7–15)
BUN: 19 mg/dL (ref 6–20)
Bicarbonate: 26 mmol/L (ref 22–29)
Calcium: 8.8 mg/dL (ref 8.5–10.6)
Chloride: 96 mmol/L — ABNORMAL LOW (ref 98–107)
Creatinine: 1.12 mg/dL (ref 0.67–1.17)
GFR: >60 mL/min
Glucose: 111 mg/dL — ABNORMAL HIGH (ref 70–99)
Potassium: 3.9 mmol/L (ref 3.5–5.1)
Sodium: 132 mmol/L — ABNORMAL LOW (ref 136–145)

## 2019-07-06 LAB — CBC WITH DIFF, BLOOD
ANC-Automated: 5.1 10*3/uL (ref 1.6–7.0)
Abs Basophils: 0 10*3/uL
Abs Eosinophils: 0.3 10*3/uL (ref 0.0–0.5)
Abs Lymphs: 1.6 10*3/uL (ref 0.8–3.1)
Abs Monos: 0.7 10*3/uL (ref 0.2–0.8)
Basophils: 0 %
Eosinophils: 4 %
Hct: 38.6 % — ABNORMAL LOW (ref 40.0–50.0)
Hgb: 12 gm/dL — ABNORMAL LOW (ref 13.7–17.5)
Lymphocytes: 20 %
MCH: 28.4 pg (ref 26.0–32.0)
MCHC: 31.1 g/dL — ABNORMAL LOW (ref 32.0–36.0)
MCV: 91.5 um3 (ref 79.0–95.0)
MPV: 8.7 fL — ABNORMAL LOW (ref 9.4–12.4)
Monocytes: 9 %
Plt Count: 233 10*3/uL (ref 140–370)
RBC: 4.22 10*6/uL — ABNORMAL LOW (ref 4.60–6.10)
RDW: 14.7 % — ABNORMAL HIGH (ref 12.0–14.0)
Segs: 66 %
WBC: 7.7 10*3/uL (ref 4.0–10.0)

## 2019-07-06 LAB — CONFIRM AMPHETAMINES-URINE
Conf. Amphetamine: POSITIVE ng/mL — AB
Conf. MDA: NEGATIVE ng/mL
Conf. MDMA: NEGATIVE ng/mL
Conf. Methamphetamine: POSITIVE ng/mL — AB

## 2019-07-06 LAB — 2D ECHO WITH IMAGE ENHANCEMENT AGENT IF NECESSARY: LV Ejection Fraction: 38 %

## 2019-07-06 LAB — PHOSPHORUS, BLOOD: Phosphorous: 3.1 mg/dL (ref 2.7–4.5)

## 2019-07-06 LAB — MAGNESIUM, BLOOD: Magnesium: 1.9 mg/dL (ref 1.6–2.6)

## 2019-07-06 MED ORDER — PERFLUTREN LIPID MICROSPHERE 0.55 MG/10 ML IV SUSP (DILUTION)
10.0000 mL | Freq: Once | INTRAVENOUS | Status: AC
Start: 2019-07-06 — End: 2019-07-06
  Administered 2019-07-06: 12:00:00 0.55 mg via INTRAVENOUS
  Filled 2019-07-06: qty 10

## 2019-07-06 MED ORDER — LIDOCAINE 4 % EX CREA
TOPICAL_CREAM | CUTANEOUS | Status: DC | PRN
Start: 2019-07-06 — End: 2019-07-25
  Administered 2019-07-06: via TOPICAL
  Filled 2019-07-06: qty 5

## 2019-07-06 NOTE — Progress Notes (Signed)
Infectious Diseases Progress Note    Date of service: 07/06/19    Events: No acute events overnight    Subjective: Feels "crappy", not much change from yesterday, ongoing back pain, no change in leg strength.  Previously was on vancomycin without issues.    ANTIMICROBIALS:  Current:  Daptomycin 8 mg/kg q24 8/10-present    Prior:  Daptomycin 8/3 to 8/7    PHYSICAL EXAM  Temperature:  [98 F (36.7 C)-99.1 F (37.3 C)] 98 F (36.7 C) (08/26 1549)  Blood pressure (BP): (99-118)/(55-67) 112/67 (08/26 1549)  Heart Rate:  [60-93] 93 (08/26 1549)  Respirations:  [16-18] 18 (08/26 1549)  Pain Score: 9 (08/26 1601)  O2 Device: None (Room air) (08/26 1204)  SpO2:  [94 %-100 %] 98 % (08/26 1549)  GEN: laying in bed, alert and conversant in NAD  HEENT: sclera anicteric, OP clear  Pulm: nl resp effort  Abd: soft, NT/ND  Back: incision site well-healed, no significant erythema  Ext: wwp  Neuro: moving all extremities without issues    All pertinent labs reviewed in EPIC and notable for:  WBC 7.7    CRP (mg/dL)   Date Value   37/10/626908/23/2020 5.32   06/24/2019 3.38   06/23/2019 0.59   06/20/2019 0.21     Sed Rate (mm/hr)   Date Value   07/03/2019 44   06/24/2019 65   06/20/2019 51     MICRO:  BLOOD:  8/23 Blood cultures 1/4 MRSA - sensis pending  8/25 Blood cultures in process    Prior:  8/10 blood cultures x2 negative    At OSH:  7/28 body fluid culture-MRSA (S: Daptomycin, clindamycin, rifampin, tetracycline, TMP-SMX, vancomycin)  8/3 body fluid-MRSA) clindamycin, rifampin, tetracycline, TMP-SMX, Vanco)    IMAGING (personally reviewed by me and notable for):  8/24 MRI T and L-spine  1. Enhancement and incomplete fat suppression within the dorsal epidural fat from T6-T8, is concerning for infection/phlegmon within the epidural space; no visible epidural abscess within the limits of hardware artifact. Continued paraspinal inflammation and bilateral pleural effusions, without visible posterior mediastinal/pleural/retroperitoneal  abscess. Dr. Richard MiuSeymann was paged to alert him of findings at 1730 hours on 07/04/2019.  2. Persistent mild enhancement of the ventral cauda equina nerve roots as seen on prior MRI 06/21/2019.  3. Redemonstration of posterior decompression and instrumented fusion from T8-L1 with interval decrease in the size of a postoperative fluid collection in the midline subcutaneous tissues.  4. Multilevel degenerative changes without high-grade central canal stenosis in either the thoracic or lumbar spine, within the limitations of metallic artifact from the patient's spinal fusion hardware.    07/06/19 TTE  Summary:   1. No clear vegetations appreciated on this study. If strong clinical suspicion for endocarditis consider transesophageal echocardiogram.   2. The left ventricular size is mildly increased. The left ventricular systolic function is moderately depressed.   3. The right ventricular size is normal and systolic function is normal.   4. No significant valvular abnormalities.   5. No previous study for comparison.   6. Primary team was notified of results.    Assessment and Plan:  43 yo with Ehler's Danlos syndrome, ankylosing spondylitis and a a prior thoracolumbar fusion c/b an infected seroma with MRSA, plans for treatment with daptomycin x 6 weeks but unable to go to the infusion center d/t lack of transportation per report and off antibiotics for about 1 week with worsening pain, MRI spine with progression of infection with possible epidural phlegmon but  without drainable collections at this time.      #Thoracolumbar osteomyelitis with hardware in place d/t MRSA with bacteremia - Has rising inflammatory markers and MRSA bacteremia which is likely d/t his progressive infection.  More prominent enhancement within the dorsal epidural fat from T6-T8 concerning for phlegmon, no drainable abscess at this time.    -Agree with continuing daptomycin for now, 600mg  daily  -Check CPK and follow weekly while on  daptomycin  -Vancomycin is another option if going to a SNF (dapto previously used for ease of dosing), however he does not think he could tolerate being at a SNF for a prolonged time  -TTE w/o vegetations, to f/u repeat blood cultures and if rapidly cleared then would hold on TEE  -Ortho spine following, no acute surgical intervention at this time  -Recommend suture removal when feasible.     #Hx Drug use  #Social instability - was unable to follow through with infusion center at discharge twice, discussed going to a SNF but   -Recommend SW consult, case management following     Recommendations discussed with the primary team.  Will follow, please page with questions.    Reino Kent, MD  ID Attending

## 2019-07-06 NOTE — Plan of Care (Signed)
Problem: Promotion of Health and Safety  Goal: Promotion of Health and Safety  Description: The patient remains safe, receives appropriate treatment and achieves optimal outcomes (physically, psychosocially, and spiritually) within the limitations of the disease process by discharge.    Information below is the current care plan.  Outcome: Progressing  Flowsheets  Taken 07/06/2019 0130 by Laurey Morale, RN  Guidelines: Inpatient Nursing Guidelines  Individualized Interventions/Recommendations #3 (if applicable): Moniotor for pain and adminster pain meds  Individualized Interventions/Recommendations #4 (if applicable): fall precautions maintained  Outcome Evaluation (rationale for progressing/not progressing) every shift: Patient is resting, calls frequently for snacks and pain meds. Patient had a visitor and stayed in the room for till 12MN and left. Patient was informed that no visitor allowed. patiemt ambulated to the  bath room .  Taken 07/05/2019 1532 by Stefani Dama Dizon, RN  Individualized Interventions/Recommendations #2 (if applicable): Provide patient with iPad for pt's entertainment.  Taken 07/05/2019 0755 by Stefani Dama Dizon, RN  Patient /Family stated Goal: pain control  Taken 07/04/2019 1456 by Stefani Dama Dizon, RN  Individualized Interventions/Recommendations #1: Cluster care to promote rest and reduce agitation.

## 2019-07-06 NOTE — Progress Notes (Signed)
[facility] day:   2 days - Admitted on: 07/03/2019    Dearius Hoffmann is a76 year oldmale with hx ofankylosing spondylitis, Ehler's Danlos, autism, and recent spinal fusion surgery c/b postop seroma with MRSA s/p I&D 06/13/19 and initiation of daptomycin twice with readmissions for trouble adhering to outpatient infusion therapy, now with BCx positive for MRSA.    24 Hour - Interval Events   PIV pulled due to infiltration    Subjective   - Endorses diffuse pain, especially in back and arm where abx infiltrated  - He and his partner are agreeable to him being admitted for 1-2 months    Physical Exam   Temp  Min: 98.7 F (37.1 C)  Max: 98.8 F (37.1 C)  Pulse  Min: 60  Max: 85  BP  Min: 99/55  Max: 118/55  Resp  Min: 18  Max: 18  SpO2  Min: 94 %  Max: 98 %    BP 99/55 (BP Location: Left arm, BP Patient Position: Semi-Fowlers)   Pulse 85   Temp 98.8 F (37.1 C)   Resp 18   Ht 5\' 6"  (1.676 m)   Wt 72.6 kg (160 lb)   SpO2 94%   BMI 25.82 kg/m  O2 Device: None (Room air)      08/25 0600 - 08/26 0559  In: 583 [P.O.:480; I.V.:103]  Out: 150 [Urine:150]  Urine x 1 Stool x 0 Emesis x 0     GENERAL EXAM: NAD, well-developed, well-nourished  SKIN: Erythema, edema, tenderness to palpation, and warmth over surgical incision in back, no purulent drainage, fluctuance, or dehiscence appreciated  EXTREMITIES: Warm, no peripheral edema    NEUROLOGIC EXAM:  Mental Status: Awake but sleepy, falls asleep mid-conversation. Fluent, conversant but rapid speech, interactive with poor eye contact. Normal language output and comprehension    Motor  Normal bulk and tone  UPPER EXT  Deltoids  Infrasinatus  Biceps  Triceps  Wrist extensors  Wrist flexors  FDI  ADM  APB  Finger extensors  FDP median  FDP unlar R/L  *  *  *  *  *  *  *  *  *  *  *  * LOWER EXT  Iliopsoas  Hip abductors  Hip adductors  Quadriceps  Hamstrings  Tibialis anterior  Gastroc   Foot eversion  Foot inversion  EHL  R/L  3/4  *  *  *  *  *  *  *  *  *       * Not tested    Somatosensory  Patchy areas of numbness over RLE, worse distally and over thigh    Pertinent Labs and Imaging and Meds     WBC 7.7 (08/26) HGB 12.0* (08/26) PLT 233 (08/26)    HCT 38.6* (08/26)      Na 132* (08/26) CL 96* (08/26) BUN 19 (08/26) GLU   111* (08/26)   K 3.9 (08/26) CO2 26 (08/26) Cr 1.12 (08/26)      BCx: GP cocci in clusters, presence of MRSA DNA    MRI thoracic and lumbar spine w/ and w/o contrast 07/04/19  1. Enhancement and incomplete fat suppression within the dorsal epidural fat from T6-T8, is concerning for infection/phlegmon within the epidural space; no visible epidural abscess within the limits of hardware artifact. Continued paraspinal inflammation and bilateral pleural effusions, without visible posterior mediastinal/pleural/retroperitoneal abscess. Dr. Dionisio Paschal was paged to alert him of findings at  1730 hours on 07/04/2019.    2. Persistent mild enhancement of the ventral cauda equina nerve roots as seen on prior MRI 06/21/2019.    3. Redemonstration of posterior decompression and instrumented fusion from T8-L1 with interval decrease in the size of a postoperative fluid collection in the midline subcutaneous tissues.    4. Multilevel degenerative changes without high-grade central canal stenosis in either the thoracic or lumbar spine, within the limitations of metallic artifact from the patient's spinal fusion hardware.    Scheduled Medications  . DAPTOmycin (CUBICIN) IVPB  8 mg/kg Q24H NR   . perflutren lipid microspheres in NS  10 mL Once   . polyethylene glycol  17 g Daily   . sodium chloride  3 mL Q8H     Continuous Medications  . sodium chloride       PRN Medications  . nalOXone  0.1 mg Q2 Min PRN   . oxyCODONE  10 mg Q4H PRN   . oxyCODONE  5 mg Q4H PRN   . oxyCODONE  5 mg Q4H PRN   . sodium chloride  3 mL PRN   . sodium chloride   Continuous PRN     Assessment / Plan   Drema Dallasravis Eunice is a43 year oldmale with hx ofankylosing  spondylitis, Ehler's Danlos, autism, and recent spinal fusion surgery c/b postop seroma with MRSA s/p I&D 06/13/19 and initiation of daptomycin twice with readmissions for trouble adhering to outpatient infusion therapy, now with BCx positive for MRSA.    #MRSA T5-L3 seroma infection  Worsening back pain, subjective fevers, and RLE pain/weakness/numbness likely 2/2 seroma infection from prior spinal surgery, which has not been adequately treated with abx due to difficulty with transportation to infusion clinic. Now likely c/b MRSA bacteremia, which raises concern for hardware infection.  - ID consulted  - Continue daptomycin IV 600mg  qday, per ID recs, weekly CPK  - Replace PIV  - Consult IR for phlegmon/abscess drainage  - TTE  - Ortho consulted, do not recommend surgical intervention because disagree with MRSA BCx interpretation and MRI read  - Neuro checks q4h  - Daily BCx  - Pain control: oxycodone 5-10mg  PO PRN  - SW, CM consulted for dispo plan    Discharge planning: placement and clinical improvement  Foley: No foley catheter  VTE prophylaxis: ambulatory, hold VTE ppx while considering procedure  Diet: Diet Regular  Last BM: unknown  IV fluids: No IV fluids  Access: PIVs  Code status: Full Code    Brayton LaymanSonya Maze Corniel, MSIV  Patient care was discussed in detail with Talbert ForestMcIntyre, Jonathan Stewa*.

## 2019-07-06 NOTE — Consults (Signed)
Neurosurgery Consultation    Referring Attending MD:  Talbert ForestMcIntyre, Jonathan Stewa*    Reason for consultation: Progressive weakness right lower extremity    History of Present Illness:     Brian Ritter is a 43 year old male with a PMH of ankylosing spondylitis, autism, status post T8-L1 posterior decompression and [facility] with subsequent I+D on 8/3 for suspicious fluid collection. He re-presents with a history of multiple recurrent presentations to ED (8/10 and 8/18) for worsening pain and subjective weakness/fevers. NSG was consulted for progressive weakness of right lower extremity. Pt is currently s/p I&D and has recs for treatment w/ IV Dapto after dx of MRSA bacteremia with epidural infection but is not receiving treatment due to inability to get to infusion center. Patient reports progressive weakness and loss of sensation in his right leg over the last several days. He is able to ambulate without assistive devices.Endorses isolated bowel or bladder incontinence last week but not symptoms currently. He additionally reports ongoing fevers and chills.    Past Medical and Surgical History:  Per patient   Discectomy 10 yrs ago- disc herniation  T7-10 (self-reported) laminectomy 10.2019- benign mass  T8-L1 decompression and fusion 8.3.2020, complicated by infection requiring I and D    Allergies:  Allergies   Allergen Reactions   . Wound Dressing Adhesive Hives       Medications:  Current Facility-Administered Medications   Medication   . DAPTOmycin (CUBICIN) 600 mg in sodium chloride 0.9 % 100 mL IVPB   . lidocaine (LMX 4) 4 % cream   . nalOXone (NARCAN) injection 0.1 mg   . oxyCODONE (ROXICODONE) tablet 10 mg   . oxyCODONE (ROXICODONE) tablet 5 mg   . oxyCODONE (ROXICODONE) tablet 5 mg   . perflutren lipid microspheres in NS (DEFINITY) injection 0.55 mg   . polyethylene glycol (MIRALAX) packet 17 g   . sodium chloride 0.9 % flush 3 mL   . sodium chloride 0.9 % flush 3 mL   . sodium chloride 0.9 % TKO  infusion       Social History:  Social History     Socioeconomic History   . Marital status: Single     Spouse name: Not on file   . Number of children: Not on file   . Years of education: Not on file   . Highest education level: Not on file   Occupational History   . Not on file   Tobacco Use   . Smoking status: Not on file   Substance and Sexual Activity   . Alcohol use: Not on file   . Drug use: Not on file   . Sexual activity: Not on file   Social Activities of Daily Living Present   . Not on file   Social History Narrative   . Not on file       Review of Systems:  A 10 point review of systems was performed and was negative, except as listed in HPI     Physical Exam:  BP 112/67 (BP Location: Left arm, BP Patient Position: Semi-Fowlers)   Pulse 93   Temp 98 F (36.7 C)   Resp 18   Ht 5\' 6"  (1.676 m)   Wt 72.6 kg (160 lb)   SpO2 98%   BMI 25.82 kg/m   A&Ox4 FCx4  CNII-XII intact  5/5 BUE  5/5 LLE, 5/5 hip flexion/knee extention, 4/5 R dorsiflexion/plantar flexion  decreased sensation to light touch in RLE  No Hoffmans  + Clonus: 3 beats RLE, 2 beats LLE  No Babinski  Rectal exam intact 8.26  PVR pending     Labs and Other Data:  Recent Labs     07/03/19  2331 07/05/19  0552 07/06/19  0741   NA 133* 132* 132*   K 4.2 4.2 3.9   CL 97* 95* 96*   BICARB 24 25 26    BUN 11 16 19    CREAT 0.92 0.99 1.12   GLU 111* 102* 111*   [place] 8.9 9.2 8.8   MG  --  2.0 1.9   PHOS  --  4.6* 3.1     Recent Labs     07/03/19  2331 07/05/19  0552 07/06/19  0741   WBC 8.7 6.9 7.7   HGB 13.0* 12.5* 12.0*   HCT 40.1 39.2* 38.6*   PLT 227 252 233   SEG 61 53 66     No results for input(s): PT, INR, PTT in the last 72 hours.    Imaging:   8.10.20 CT L Spine  IMPRESSION:  Status post T8-L1 laminectomy. The at the right T11 pedicle screws extend medial to the pedicle into the spinal canal. Evaluation of the soft tissues is limited due to artifact from hardware.  Multilevel degenerative changes are present.    8.10.20 CT T  Spine  IMPRESSION:  Status post T8-L1 laminectomy. The at the right T11 pedicle screws extend medial to the pedicle into the spinal canal. Evaluation of the soft tissues is limited due to artifact from hardware.  Multilevel degenerative changes are present.    Assessment/Recommendations:  43 year old male with PMH of ankylosing spondylitis, autism, prior discectomy and multi-level laminectomy now status post T8-L1 posterior decompression and fusion at [facility] in [facility] with subsequent I+D on 8/3 presenting to ED for progressive weakness in RLE found to have MRSA bacteremia and MRI concerning for epidural infection . NSG consulted for progressive weakness. Patient is improving on current course of antibiotics.     Recommendations:  - No need for neurosurgical intervention at this time  - Activity as tolerated with assistance  - VTE ppx: Per primary  - Antibiotics:Per primary. It is critical the patient receives his IV dapto for a full course as previously recommended by ID.   - Pain control:multimodal pain control with PO meds, minimize IV   - Imaging: no further  - Diet:Per primary  - Please contact NSG for any change in neuro exam    The above plan was discussed with the chief resident Dr. Hassell Done and attending Dr Ria Comment.     Horace Porteous, MD   Neurosurgery Resident  [facility]  [place], [facility] Pager [phone]

## 2019-07-07 ENCOUNTER — Ambulatory Visit (HOSPITAL_BASED_OUTPATIENT_CLINIC_OR_DEPARTMENT_OTHER): Payer: Medicaid Other

## 2019-07-07 LAB — CBC WITH DIFF, BLOOD
ANC-Manual Mode: 5.5 10*3/uL (ref 1.6–7.0)
Abs Basophils: 0.1 10*3/uL
Abs Eosinophils: 0 10*3/uL (ref 0.0–0.5)
Abs Lymphs: 2 10*3/uL (ref 0.8–3.1)
Abs Monos: 0.3 10*3/uL (ref 0.2–0.8)
Basophils: 1 %
Eosinophils: 0 %
Hct: 39.6 % — ABNORMAL LOW (ref 40.0–50.0)
Hgb: 13.1 gm/dL — ABNORMAL LOW (ref 13.7–17.5)
Lymphocytes: 25 %
MCH: 30 pg (ref 26.0–32.0)
MCHC: 33.1 g/dL (ref 32.0–36.0)
MCV: 90.8 um3 (ref 79.0–95.0)
MPV: 10.5 fL (ref 9.4–12.4)
Monocytes: 4 %
Plt Count: 186 10*3/uL (ref 140–370)
RBC: 4.36 10*6/uL — ABNORMAL LOW (ref 4.60–6.10)
RDW: 14.4 % — ABNORMAL HIGH (ref 12.0–14.0)
Segs: 70 %
WBC: 7.8 10*3/uL (ref 4.0–10.0)

## 2019-07-07 LAB — BASIC METABOLIC PANEL, BLOOD
Anion Gap: 14 mmol/L (ref 7–15)
BUN: 15 mg/dL (ref 6–20)
Bicarbonate: 20 mmol/L — ABNORMAL LOW (ref 22–29)
Calcium: 9.1 mg/dL (ref 8.5–10.6)
Chloride: 97 mmol/L — ABNORMAL LOW (ref 98–107)
Creatinine: 0.87 mg/dL (ref 0.67–1.17)
GFR: >60 mL/min
Glucose: 100 mg/dL — ABNORMAL HIGH (ref 70–99)
Potassium: 4.7 mmol/L (ref 3.5–5.1)
Sodium: 131 mmol/L — ABNORMAL LOW (ref 136–145)

## 2019-07-07 LAB — MDIFF
Number of Cells Counted: 118
Plt Est: ADEQUATE
RBC Comment: NORMAL

## 2019-07-07 LAB — PHOSPHORUS, BLOOD: Phosphorous: 3.6 mg/dL (ref 2.7–4.5)

## 2019-07-07 LAB — MAGNESIUM, BLOOD: Magnesium: 1.9 mg/dL (ref 1.6–2.6)

## 2019-07-07 MED ORDER — CYCLOBENZAPRINE HCL 10 MG OR TABS
5.0000 mg | ORAL_TABLET | Freq: Three times a day (TID) | ORAL | Status: DC | PRN
Start: 2019-07-07 — End: 2019-07-07
  Filled 2019-07-07: qty 1

## 2019-07-07 MED ORDER — TIZANIDINE HCL 4 MG OR TABS
4.0000 mg | ORAL_TABLET | Freq: Four times a day (QID) | ORAL | Status: DC | PRN
Start: 2019-07-07 — End: 2019-07-25
  Administered 2019-07-24 – 2019-07-25 (×2): 4 mg via ORAL
  Filled 2019-07-07 (×3): qty 1

## 2019-07-07 MED ORDER — ENOXAPARIN SODIUM 40 MG/0.4ML SC SOLN
40.0000 mg | Freq: Every day | SUBCUTANEOUS | Status: DC
Start: 2019-07-07 — End: 2019-07-25
  Administered 2019-07-07 – 2019-07-25 (×16): 40 mg via SUBCUTANEOUS
  Filled 2019-07-07 (×18): qty 1

## 2019-07-07 MED ORDER — ACETAMINOPHEN 325 MG PO TABS
975.0000 mg | ORAL_TABLET | Freq: Three times a day (TID) | ORAL | Status: DC
Start: 2019-07-07 — End: 2019-07-14
  Administered 2019-07-07 (×2): 975 mg via ORAL
  Filled 2019-07-07 (×7): qty 3

## 2019-07-07 MED ORDER — CYCLOBENZAPRINE HCL 10 MG OR TABS
5.0000 mg | ORAL_TABLET | Freq: Three times a day (TID) | ORAL | Status: DC | PRN
Start: 2019-07-07 — End: 2019-07-07

## 2019-07-07 MED ORDER — POTASSIUM CHLORIDE CRYS CR 20 MEQ OR TBCR
40.0000 meq | EXTENDED_RELEASE_TABLET | Freq: Once | ORAL | Status: AC
Start: 2019-07-07 — End: 2019-07-07
  Administered 2019-07-07: 02:00:00 40 meq via ORAL
  Filled 2019-07-07: qty 2

## 2019-07-07 NOTE — Interdisciplinary (Signed)
Paged 1st call [phone] Brian Ritter re: Just fyi, this RN caught patient pretending to take Oxycodone 10 mg and hid it between fingers; patient then took med immediately when RN ask him to show hand.

## 2019-07-07 NOTE — Plan of Care (Signed)
Problem: Promotion of Health and Safety  Goal: Promotion of Health and Safety  Description: The patient remains safe, receives appropriate treatment and achieves optimal outcomes (physically, psychosocially, and spiritually) within the limitations of the disease process by discharge.    Information below is the current care plan.  Outcome: Progressing  Flowsheets (Taken 07/07/2019 0006)  Patient /Family stated Goal: pain control  Guidelines: Inpatient Nursing Guidelines  Individualized Interventions/Recommendations #1: promote rest: cluster care and minimize noise  Individualized Interventions/Recommendations #2 (if applicable): assess pain and provide pain regimen as needed  Individualized Interventions/Recommendations #3 (if applicable): monitor for signs and symptoms of infection  Outcome Evaluation (rationale for progressing/not progressing) every shift: Patient involve in plan of care, no acute distress. Patient c/o 8/10 back pain and spasms. Oxycodone 10 mg given Q4hrs prn and lidocaine cream for back. IV Daptomycin given. Will continue to monitor.

## 2019-07-07 NOTE — Interdisciplinary (Signed)
Paged 1st call re: patient c/o really back back muscle  Spasms. Oxycodone 10 mg given and Lidocaine cream applied. Is there anything else he could have. Also, temperature 100.3, no order to give PRN Tylenol.

## 2019-07-07 NOTE — Progress Notes (Signed)
[facility] day:   3 days - Admitted on: 07/03/2019    Brian Ritter is a58 year oldmale with hx ofankylosing spondylitis, Ehler's Danlos, autism, and recent spinal fusion surgery c/b postop seroma with MRSA s/p I&D 06/13/19 and initiation of daptomycin twice with readmissions for trouble adhering to outpatient infusion therapy, now with BCx positive for MRSA.    24 Hour - Interval Events   Nurse noted that Brian Ritter appeared to hide his oxycodone in his palm. She asked him to open his hand, and he agreed to take the medication    Subjective   - Brian Ritter stated that he temporarily held his oxycodone in his hand while readjusting himself, and was not trying to hide his medication  - Continues to endorse pain, muscle spasms. States Flexeril gives him restless legs syndrome    Physical Exam   Temp  Min: 98 F (36.7 C)  Max: 100.3 F (37.9 C)  Pulse  Min: 72  Max: 97  BP  Min: 105/63  Max: 125/77  Resp  Min: 16  Max: 18  SpO2  Min: 95 %  Max: 100 %    BP 110/66 (BP Location: Left arm, BP Patient Position: Semi-Fowlers)   Pulse 79   Temp 99.1 F (37.3 C)   Resp 16   Ht 5\' 6"  (1.676 m)   Wt 72.6 kg (160 lb)   SpO2 98%   BMI 25.82 kg/m  O2 Device: None (Room air)      08/26 0600 - 08/27 0559  In: 1060 [P.O.:1060]  Out: -   Urine x 5 Stool x 1 Emesis x 0     GENERAL EXAM: NAD, well-developed, well-nourished  SKIN: Erythema, edema, tenderness to palpation, and warmth over surgical incision in back, no purulent drainage, fluctuance, or dehiscence appreciated. Also has TTP, edema, and induration on L arm at site of former IV infiltration which are improving. Area of erythema previously marked with pen, now smaller and less erythematous.   EXTREMITIES: WWP, no peripheral edema    NEUROLOGIC EXAM:  Mental Status: Awake but sleepy, irritable. Fluent, conversant but rapid speech, interactive with poor eye contact. Normal language output and comprehension    Motor  Normal bulk and  tone  UPPER EXT  Deltoids  Infrasinatus  Biceps  Triceps  Wrist extensors  Wrist flexors  FDI  ADM  APB  Finger extensors  FDP median  FDP unlar R/L  *  *  *  *  *  *  *  *  *  *  *  * LOWER EXT  Iliopsoas  Hip abductors  Hip adductors  Quadriceps  Hamstrings  Tibialis anterior  Gastroc   Foot eversion  Foot inversion  EHL R/L  3/4  *  *  *  *  *  *  *  *  *       * Not tested    Somatosensory  Patchy areas of numbness over RLE, worse distally and over thigh    Pertinent Labs and Imaging and Meds     WBC 7.8 (08/27) HGB 13.1* (08/27) PLT 186 (08/27)    HCT 39.6* (08/27)      Na 131* (08/27) CL 97* (08/27) BUN 15 (08/27) GLU   100* (08/27)   K 4.7 (08/27) CO2 20* (08/27) Cr 0.87 (08/27)      BCx 8/23: GP cocci in clusters, presence of MRSA DNA  BCx 8/24-8/26 NGTD  MRI thoracic and lumbar spine w/ and w/o contrast 07/04/19  1. Enhancement and incomplete fat suppression within the dorsal epidural fat from T6-T8, is concerning for infection/phlegmon within the epidural space; no visible epidural abscess within the limits of hardware artifact. Continued paraspinal inflammation and bilateral pleural effusions, without visible posterior mediastinal/pleural/retroperitoneal abscess. Brian Ritter was paged to alert him of findings at 1730 hours on 07/04/2019.    2. Persistent mild enhancement of the ventral cauda equina nerve roots as seen on prior MRI 06/21/2019.    3. Redemonstration of posterior decompression and instrumented fusion from T8-L1 with interval decrease in the size of a postoperative fluid collection in the midline subcutaneous tissues.    4. Multilevel degenerative changes without high-grade central canal stenosis in either the thoracic or lumbar spine, within the limitations of metallic artifact from the patient's spinal fusion hardware.    Scheduled Medications  . DAPTOmycin (CUBICIN) IVPB  8 mg/kg Q24H NR   . perflutren lipid microspheres in NS  10 mL Once   . polyethylene glycol  17 g Daily   . sodium  chloride  3 mL Q8H     Continuous Medications  . sodium chloride       PRN Medications  . cyclobenzaprine  5 mg Q8H PRN   . lidocaine   Q4H PRN   . nalOXone  0.1 mg Q2 Min PRN   . oxyCODONE  10 mg Q4H PRN   . oxyCODONE  5 mg Q4H PRN   . oxyCODONE  5 mg Q4H PRN   . sodium chloride  3 mL PRN   . sodium chloride   Continuous PRN     Assessment / Plan   Brian Ritter is a43 year oldmale with hx ofankylosing spondylitis, Ehler's Danlos, autism, and recent spinal fusion surgery c/b postop seroma with MRSA s/p I&D 06/13/19 and initiation of daptomycin twice with readmissions for trouble adhering to outpatient infusion therapy, now with BCx positive for MRSA.    #MRSA T5-L3 seroma infection  Worsening back pain and RLE pain/weakness/numbness concerning for persistent 2/2 seroma infection from prior spinal surgery, which has not been adequately treated with abx due to difficulty with transportation to infusion clinic. Presence of MRSA in blood cx on admission suggestive of bacteremia and hardware infection, but WBC WNL and febrile only once to 100.3. Negative repeat BCx suggests that infection may have cleared from blood. Exam and MRI reassuring for cauda equina syndrome. TTE negative for endocarditis.  - ID consulted  - Continue daptomycin IV 600mg  qday, per ID recs, weekly CPK  - Ortho and neurosurgery consulted, do not recommend surgical intervention at this time  - Neuro checks q4h  - Daily BCx  - Pain control: oxycodone 5-10mg  PO PRN, tylenol 975mg  q8h  - CM consulted for dispo plan: will likely require LTAC vs SNF given poor adherence to outpatient IV abx    Discharge planning: placement and clinical improvement  Foley: No foley catheter  VTE prophylaxis: LMWH  Diet: Diet Regular  Last BM: 8/26  IV fluids: No IV fluids  Access: PIVs  Code status: Full Code    Brian Ritter, MSIV  Patient care was discussed in detail with Brian Ritter, Brian Ritter*.

## 2019-07-08 ENCOUNTER — Ambulatory Visit (HOSPITAL_BASED_OUTPATIENT_CLINIC_OR_DEPARTMENT_OTHER): Payer: Medicaid Other

## 2019-07-08 DIAGNOSIS — M4620 Osteomyelitis of vertebra, site unspecified: Secondary | ICD-10-CM

## 2019-07-08 LAB — BASIC METABOLIC PANEL, BLOOD
Anion Gap: 13 mmol/L (ref 7–15)
Anion Gap: 11 mmol/L (ref 7–15)
BUN: 13 mg/dL (ref 6–20)
BUN: 11 mg/dL (ref 6–20)
Bicarbonate: 26 mmol/L (ref 22–29)
Bicarbonate: 25 mmol/L (ref 22–29)
Calcium: 9.2 mg/dL (ref 8.5–10.6)
Calcium: 8.9 mg/dL (ref 8.5–10.6)
Chloride: 94 mmol/L — ABNORMAL LOW (ref 98–107)
Chloride: 97 mmol/L — ABNORMAL LOW (ref 98–107)
Creatinine: 0.82 mg/dL (ref 0.67–1.17)
Creatinine: 0.86 mg/dL (ref 0.67–1.17)
GFR: >60 mL/min
GFR: >60 mL/min
Glucose: 117 mg/dL — ABNORMAL HIGH (ref 70–99)
Glucose: 100 mg/dL — ABNORMAL HIGH (ref 70–99)
Potassium: 4.2 mmol/L (ref 3.5–5.1)
Potassium: 3.9 mmol/L (ref 3.5–5.1)
Sodium: 133 mmol/L — ABNORMAL LOW (ref 136–145)
Sodium: 133 mmol/L — ABNORMAL LOW (ref 136–145)

## 2019-07-08 LAB — CBC WITH DIFF, BLOOD
ANC-Automated: 4.5 10*3/uL (ref 1.6–7.0)
Abs Basophils: 0 10*3/uL
Abs Eosinophils: 0 10*3/uL (ref 0.0–0.5)
Abs Lymphs: 0.6 10*3/uL — ABNORMAL LOW (ref 0.8–3.1)
Abs Monos: 0.6 10*3/uL (ref 0.2–0.8)
Basophils: 0 %
Eosinophils: 1 %
Hct: 40.8 % (ref 40.0–50.0)
Hgb: 13 gm/dL — ABNORMAL LOW (ref 13.7–17.5)
Lymphocytes: 11 %
MCH: 28.7 pg (ref 26.0–32.0)
MCHC: 31.9 g/dL — ABNORMAL LOW (ref 32.0–36.0)
MCV: 90.1 um3 (ref 79.0–95.0)
MPV: 8.7 fL — ABNORMAL LOW (ref 9.4–12.4)
Monocytes: 10 %
Plt Count: 205 10*3/uL (ref 140–370)
RBC: 4.53 10*6/uL — ABNORMAL LOW (ref 4.60–6.10)
RDW: 14.5 % — ABNORMAL HIGH (ref 12.0–14.0)
Segs: 79 %
WBC: 5.7 10*3/uL (ref 4.0–10.0)

## 2019-07-08 LAB — CONFIRM OPIATES, URINE
Conf. 6 Acetylmorphine: NEGATIVE ng/mL (ref 0–9)
Conf. Codeine: NEGATIVE ng/mL (ref 0–99)
Conf. EDDP: NEGATIVE ng/mL (ref 0–99)
Conf. Fentanyl: NEGATIVE ng/mL (ref 0–4)
Conf. Hydrocodone: NEGATIVE ng/mL (ref 0–99)
Conf. Hydromorphone: NEGATIVE ng/mL (ref 0–99)
Conf. Methadone: NEGATIVE ng/mL (ref 0–99)
Conf. Morphine: POSITIVE ng/mL — AB (ref 0–99)
Conf. Norcodeine: NEGATIVE ng/mL (ref 0–99)
Conf. Norfentanyl: NEGATIVE ng/mL (ref 0–4)
Conf. Norhydrocodone: NEGATIVE ng/mL (ref 0–99)
Conf. Noroxycodone: POSITIVE ng/mL — AB (ref 0–49)
Conf. Oxycodone: POSITIVE ng/mL — AB (ref 0–49)
Conf. Oxymorphone: POSITIVE ng/mL — AB (ref 0–49)

## 2019-07-08 LAB — MAGNESIUM, BLOOD: Magnesium: 1.9 mg/dL (ref 1.6–2.6)

## 2019-07-08 LAB — PHOSPHORUS, BLOOD: Phosphorous: 2.7 mg/dL (ref 2.7–4.5)

## 2019-07-08 MED ORDER — SODIUM CHLORIDE 0.9 % IJ SOLN (CUSTOM)
10.0000 mL | INTRAMUSCULAR | Status: DC | PRN
Start: 2019-07-08 — End: 2019-07-08

## 2019-07-08 MED ORDER — SODIUM CHLORIDE 0.9 % IV SOLN
Freq: Once | INTRAVENOUS | Status: AC
Start: 2019-07-08 — End: 2019-07-08
  Administered 2019-07-08: 14:00:00 via INTRAVENOUS

## 2019-07-08 MED ORDER — VANCOMYCIN HCL 750 MG IV SOLR
750.0000 mg | Freq: Four times a day (QID) | INTRAVENOUS | Status: DC
Start: 2019-07-08 — End: 2019-07-10
  Administered 2019-07-08 – 2019-07-10 (×6): 750 mg via INTRAVENOUS
  Filled 2019-07-08 (×6): qty 750

## 2019-07-08 MED ORDER — VANCOMYCIN PER PHARMACY
INTRAVENOUS | Status: DC
Start: 2019-07-08 — End: 2019-07-08

## 2019-07-08 MED ORDER — SODIUM CHLORIDE 0.9 % IJ SOLN (CUSTOM)
10.0000 mL | Freq: Three times a day (TID) | INTRAMUSCULAR | Status: DC
Start: 2019-07-08 — End: 2019-07-08

## 2019-07-08 MED ORDER — LIDOCAINE HCL 1 % IJ SOLN
5.0000 mL | Freq: Once | INTRAMUSCULAR | Status: DC | PRN
Start: 2019-07-08 — End: 2019-07-08

## 2019-07-08 MED ORDER — HEPARIN SODIUM LOCK FLUSH 100 UNIT/ML IJ SOLN CUSTOM
100.0000 [IU] | Freq: Once | INTRAVENOUS | Status: DC | PRN
Start: 2019-07-08 — End: 2019-07-08

## 2019-07-08 NOTE — Interdisciplinary (Signed)
07/08/19 1159   Follow Up/Progress   Is the Patient Ready for Discharge * Yes   Barriers to Discharge * Placement   Patient/Family/Legal/Surrogate Decision Maker Has Been Given a List Options And Choice In The Selection of Post-Acute Care Providers * Yes   Patient/Family/Other Are In Agreement With Discharge Plan * To be determined       .07/08/19  12:00 PM    Medical Intervention(s) requiring continued Hospital Stay:  #MRSA T5-L3 seroma infection-Per ID switch to IV Vancomycin for 6 weeks.    Anticipated dispo plan:-   Patient lives in a [place] with his partner. Hx of U-tox + for Meth on prior admission. Poor compliance.  Has failed out-patient infusion center twice due to no show.  [facility] is looking into getting LOA for IV Daptomcyin but since now switched to IV Vanco SNF referral initiated.  Patient has [employer] form [place] not contract with [place] SN[place]ACHs. If any accepting SNF will have to do an LOA.      Barriers to Discharge:  Placement for IV Vancomycin for 6 weeks.      Delman Cheadle, RN  Care Manager

## 2019-07-08 NOTE — Progress Notes (Signed)
Infectious Diseases Progress Note    Date of service: 07/08/19    Events:   - No acute events    Subjective:   States that he feels the same. Back pain is the same. Has trouble sleeping.    ANTIMICROBIALS:  Current:  Daptomycin8 mg/kg q248/10-present    Prior:  Daptomycin 8/3 to 8/7    PHYSICAL EXAM   Temperature:  [97.9 F (36.6 C)-99.4 F (37.4 C)] 98.5 F (36.9 C) (08/28 0200)  Blood pressure (BP): (103-115)/(64-71) 105/71 (08/28 0200)  Heart Rate:  [79-110] 97 (08/28 0200)  Respirations:  [16-18] 17 (08/28 0300)  Pain Score: 8 (08/28 0625)  O2 Device: None (Room air) (08/28 0200)  SpO2:  [98 %-100 %] 99 % (08/28 0200)  GEN: laying in bed, alert and conversant in NAD  HEENT: sclera anicteric, OP clear  Pulm: nl resp effort  Abd: soft, NT/ND  Back: incision site well-healed, no significant erythema  Ext: wwp  Neuro: moving all extremities without issues    LABS (All pertinent labs reviewed in EPIC and notable for):  Recent Labs     07/06/19  0741 07/07/19  0635 07/08/19  0620   WBC 7.7 7.8 5.7   SEG 66 70 79   LYMPHS 20 25 11    HGB 12.0* 13.1* 13.0*   HCT 38.6* 39.6* 40.8   PLT 233 186 205     Recent Labs     07/06/19  0741 07/07/19  0635   NA 132* 131*   K 3.9 4.7   CL 96* 97*   BICARB 26 20*   BUN 19 15   CREAT 1.12 0.87   GLU 111* 100*   [place] 8.8 9.1   PHOS 3.1 3.6   MG 1.9 1.9       CRP (mg/dL)   Date Value   07/03/2019 5.32 (H)   06/24/2019 3.38 (H)   06/23/2019 0.59 (H)   06/20/2019 0.21     Sed Rate (mm/hr)   Date Value   07/03/2019 44 (H)   06/24/2019 65 (H)   06/20/2019 51 (H)       MICRO:  BLOOD:  8/23 Blood cultures 1/4 MRSA - S to Vanco  8/25 Blood cultures: NGTD  8/26: Blood CX: NGTD  8/27: Blood Cx: NGTD  8/28 in progress    Prior:  8/10 blood cultures x2negative    At OSH:  7/28 body fluid culture-MRSA (S: Daptomycin, clindamycin, rifampin, tetracycline, TMP-SMX, vancomycin)  8/3 body fluid-MRSA) clindamycin, rifampin, tetracycline, TMP-SMX, Vanco)    IMAGING (personally reviewed by me and  notable for):  8/24 MRI T and L-spine  1. Enhancement and incomplete fat suppression within the dorsal epidural fat from T6-T8, is concerning for infection/phlegmon within the epidural space; no visible epidural abscess within the limits of hardware artifact. Continued paraspinal inflammation and bilateral pleural effusions, without visible posterior mediastinal/pleural/retroperitoneal abscess. Dr. Dionisio Paschal was paged to alert him of findings at 1730 hours on 07/04/2019.  2. Persistent mild enhancement of the ventral cauda equina nerve roots as seen on prior MRI 06/21/2019.  3. Redemonstration of posterior decompression and instrumented fusion from T8-L1 with interval decrease in the size of a postoperative fluid collection in the midline subcutaneous tissues.  4. Multilevel degenerative changes without high-grade central canal stenosis in either the thoracic or lumbar spine, within the limitations of metallic artifact from the patient's spinal fusion hardware.    07/06/19 TTE  Summary:  1. No clear vegetations appreciated on this study. If  strong clinical suspicion for endocarditis consider transesophageal echocardiogram.  2. The left ventricular size is mildly increased. The left ventricular systolic function is moderately depressed.  3. The right ventricular size is normal and systolic function is normal.  4. No significant valvular abnormalities.  5. No previous study for comparison.  6. Primary team was notified of results    Assessment and Plan:  43 yo with Ehler's Danlos syndrome, ankylosing spondylitis and a a prior thoracolumbar fusion c/b an infected seroma with MRSA, plans for treatment with daptomycin x 6 weeks but unable to go to the infusion center d/t lack of transportation per report and off antibiotics for about 1 week with worsening pain, MRI spine with progression of infection with possible epidural phlegmon but without drainable collections at this time.     #Thoracolumbar osteomyelitis with  hardware in place d/t MRSA with bacteremia - Has rising inflammatory markers and MRSA bacteremia which is likely d/t his progressive infection.  More prominent enhancement within the dorsal epidural fat from T6-T8 concerning for phlegmon, no drainable abscess at this time. Given that patient will need 6 weeks of IV antibiotic therapy and the infusion center does not appear to be a reasonable option, will switch from IV Dapto to IV vancomycin.    - STOP IV Daptomycin  - Start IV Vancomycin per pharmacy today, goal trough 15-20  -TTE w/o vegetations, given cultures cleared rapidly, low concern of IE at this time.  -Can stop daily cultures  -Ortho spine and neurosurgery following, no acute surgical intervention at this time  -Recommend suture removal when feasible.    This patient requires outpatient IV antibiotics.  Continue the patient's IV Vancomycin  through at least 08/15/2019 for a total of 6 weeks from first negative blood culture on 07/05/2019 (but final duration TBD in follow up) for the patient's ID diagnosis of Thoracolumbar OM and MRSA bactermia.        Please complete the following prior to discharge:  1. Enroll the patient in the OPAT (Outpatient Parenteral Antibiotic Therapy) program through the EPIC inpatient OPAT orderset (enter "OPAT" in ordersets)  2. Please order weekly labs (CBC w/ diff, CMP, ESR, CRP, Vanc trough) to be faxed to the patient's OPAT ID physician at [phone]      #Hx Drug use  #Social instability - was unable to follow through with infusion center at discharge twice, discussed going to a SNF but he is not interested in this at this time and states he has a high likelihood of eloping from a SNF.    -Recommend SW consult, case management following    If will sign off, please page if further concerns or questions arise.    Recommendations discussed with the primary team.    Dominic Arabella Merles, MD  [facility] PGY-3  Pager [phone]    Patient discussed with Dr.  Luana Shu    Attending Note  I reviewed the chart, and saw and examined the patient.  I have reviewed Dr. Barnie Mort note and agree with the findings and plan, which reflects our discussion.  We discussed potential discharge options again today and he brought up the infusion center.  This has previously failed twice due to transportation and other unknown issues.  The only way this could possibly work is if he has definitive transportation to actually follow through with this plan.  To switch to vancomycin for now as daptomycin was used for ease of dosing at the infusion center.  Do not favor PICC  line placement given h/o polysubstance abuse and lack of a home to stay at.     Reino Kent, MD MPH  Infectious Diseases Attending

## 2019-07-08 NOTE — Plan of Care (Signed)
Problem: Promotion of Health and Safety  Goal: Promotion of Health and Safety  Description: The patient remains safe, receives appropriate treatment and achieves optimal outcomes (physically, psychosocially, and spiritually) within the limitations of the disease process by discharge.    Information below is the current care plan.  Outcome: Progressing  Flowsheets  Taken 07/08/2019 1708  Individualized Interventions/Recommendations #1: Updated pt w/ poc prn.  Individualized Interventions/Recommendations #2 (if applicable): Reminded pt to call for help as needed/ when in pain.  Individualized Interventions/Recommendations #3 (if applicable): Medicated pt w/ scheduled Vanco iv abx as ordered.  Outcome Evaluation (rationale for progressing/not progressing) every shift: Pt varbalized understanding of the plan, aware to call when help is needed. Pt was ordered for PICC, but pt refused.  PICC RN inserted piv instead.  Pt was bolused w/ 512ml NS d/t low Na level, blood sample to be collected to re-check current Na level.  Goal ongoing.  Taken 07/08/2019 0830  Patient /Family stated Goal: "None."

## 2019-07-08 NOTE — Interdisciplinary (Signed)
Paged first to call MD and PICC RN:  Brian Ritter. FYI: Pt refusing PICC line placement.

## 2019-07-08 NOTE — Interdisciplinary (Signed)
Pharmacist was text paged: can you pls re-time Vanco of rm [address] [place], [place] since I just hang the previous dose at 1353? Thanks

## 2019-07-08 NOTE — Interdisciplinary (Signed)
PICC Team - pt refusing PICC.  MD and RN aware.  Will place when pt is agreeable

## 2019-07-08 NOTE — Plan of Care (Signed)
Problem: Promotion of Health and Safety  Goal: Promotion of Health and Safety  Description: The patient remains safe, receives appropriate treatment and achieves optimal outcomes (physically, psychosocially, and spiritually) within the limitations of the disease process by discharge.    Information below is the current care plan.  Outcome: Progressing  Flowsheets  Taken 07/08/2019 0048  Guidelines: Inpatient Nursing Guidelines  Individualized Interventions/Recommendations #1: Cluster care to promote rest  Individualized Interventions/Recommendations #2 (if applicable): Pain management using current pain regimen  Outcome Evaluation (rationale for progressing/not progressing) every shift: Patient not in distress form, tolerating RA. Resting at this time, denies discomfort. Pain medication given effective, with no adverse reaction noted. Will continue to monitor patient. Call light within reach at all times.  Taken 07/07/2019 1945  Patient /Family stated Goal: pain control

## 2019-07-08 NOTE — Progress Notes (Signed)
[facility] day:   4 days - Admitted on: 07/03/2019    Brian Ritter is a15 year oldmale with hx ofankylosing spondylitis, Ehler's Danlos, autism, and recent spinal fusion surgery c/b postop seroma with MRSA s/p I&D 06/13/19 and initiation of daptomycin twice with readmissions for trouble adhering to outpatient infusion therapy, who was admitted with MRSA bacteremia with negative repeat BCx.    24 Hour - Interval Events   None    Subjective   - Less pain than yesterday, no new numbness, weakness, or pain    Physical Exam   Temp  Min: 97.9 F (36.6 C)  Max: 99.4 F (37.4 C)  Pulse  Min: 86  Max: 110  BP  Min: 103/69  Max: 123/81  Resp  Min: 16  Max: 18  SpO2  Min: 95 %  Max: 100 %    BP 118/80 (BP Location: Right arm, BP Patient Position: Semi-Fowlers)   Pulse 100   Temp 98.7 F (37.1 C)   Resp 18   Ht 5\' 6"  (1.676 m)   Wt 72.6 kg (160 lb)   SpO2 96%   BMI 25.82 kg/m  O2 Device: None (Room air)      08/27 0600 - 08/28 0559  In: 553 [P.O.:450; I.V.:103]  Out: -   Urine x 2 Stool x 0 Emesis x 0     GENERAL EXAM: NAD, well-developed, well-nourished  SKIN: Erythema, edema, tenderness to palpation, and warmth over surgical incision in back, no purulent drainage, fluctuance, or dehiscence appreciated. Also has TTP, edema, and induration on L arm at site of former IV infiltration which continues to improve. Area of erythema previously marked with pen, now smaller and less erythematous.   CARDIAC: RRR. Normal S1, S2, no m/r/g  EXTREMITIES: WWP, no peripheral edema    NEUROLOGIC EXAM:  Mental Status: Awake, fluent, conversant. Normal language output and comprehension    Motor  Normal bulk and tone  UPPER EXT  Deltoids  Infrasinatus  Biceps  Triceps  Wrist extensors  Wrist flexors  FDI  ADM  APB  Finger extensors  FDP median  FDP unlar R/L  *  *  *  *  *  *  *  *  *  *  *  * LOWER EXT  Iliopsoas  Hip abductors  Hip adductors  Quadriceps  Hamstrings  Tibialis anterior  Gastroc   Foot  eversion  Foot inversion  EHL R/L  3/4  *  *  *  *  *  *  *  *  *       * Not tested    Somatosensory  Patchy areas of numbness over RLE, worse distally and over thigh    Pertinent Labs and Imaging and Meds     WBC 5.7 (08/28) HGB 13.0* (08/28) PLT 205 (08/28)    HCT 40.8 (08/28)      Na 133* (08/28) CL 94* (08/28) BUN 13 (08/28) GLU   117* (08/28)   K 4.2 (08/28) CO2 26 (08/28) Cr 0.82 (08/28)      BCx 8/23: GP cocci in clusters, presence of MRSA DNA  BCx 8/24-8/28 NGTD    MRI thoracic and lumbar spine w/ and w/o contrast 07/04/19  1. Enhancement and incomplete fat suppression within the dorsal epidural fat from T6-T8, is concerning for infection/phlegmon within the epidural space; no visible epidural abscess within the limits of hardware artifact. Continued paraspinal inflammation and bilateral pleural  effusions, without visible posterior mediastinal/pleural/retroperitoneal abscess. Dr. Richard MiuSeymann was paged to alert him of findings at 1730 hours on 07/04/2019.    2. Persistent mild enhancement of the ventral cauda equina nerve roots as seen on prior MRI 06/21/2019.    3. Redemonstration of posterior decompression and instrumented fusion from T8-L1 with interval decrease in the size of a postoperative fluid collection in the midline subcutaneous tissues.    4. Multilevel degenerative changes without high-grade central canal stenosis in either the thoracic or lumbar spine, within the limitations of metallic artifact from the patient's spinal fusion hardware.    Scheduled Medications  . acetaminophen  975 mg Q8H   . DAPTOmycin (CUBICIN) IVPB  8 mg/kg Q24H NR   . enoxaparin  40 mg Daily   . polyethylene glycol  17 g Daily   . sodium chloride  3 mL Q8H     Continuous Medications  . sodium chloride       PRN Medications  . lidocaine   Q4H PRN   . nalOXone  0.1 mg Q2 Min PRN   . oxyCODONE  10 mg Q4H PRN   . oxyCODONE  5 mg Q4H PRN   . oxyCODONE  5 mg Q4H PRN   . sodium chloride  3 mL PRN   . sodium chloride   Continuous PRN    . tiZANidine  4 mg Q6H PRN     Assessment / Plan   Brian Ritter is a43 year oldmale with hx ofankylosing spondylitis, Ehler's Danlos, autism, and recent spinal fusion surgery c/b postop seroma with MRSA s/p I&D 06/13/19 and initiation of daptomycin twice with readmissions for trouble adhering to outpatient infusion therapy, who was admitted with MRSA bacteremia with negative repeat BCx.    #MRSA T5-L3 seroma infection  MRSA seroma infection from prior spinal surgery has not been adequately treated with abx due to difficulty with f/u. Has failed to complete IV abx therapy as outpatient, so will refer to SNF or LTAC or keep patient in hospital. Presence of MRSA in blood cx on admission suggestive of bacteremia and hardware infection, but WBC WNL and febrile only once to 100.3. Multiple negative repeat BCx suggests that infection cleared from blood. Exam and MRI reassuring for cauda equina syndrome. TTE negative for endocarditis. He was initially treated with IV daptomycin due to ease of qday dosing as outpatient, but no indication for qday dosing if he can be treated in a facility.  - ID consulted, now signing off  - Transition to IV vancomycin, per ID  - Discontinue daily BCx, per ID  - Ortho and neurosurgery consulted, do not recommend surgical intervention at this time  - Neuro checks q8h  - Pain control: oxycodone 5-10mg  PO PRN, tylenol 975mg  q8h, topical lidocaine  - CM consulted for dispo plan: will likely require inpatient IV abx given poor adherence as outpatient    #Hyponatremia  Low Na with low Cl and bicarb suggestive of dilution, possibly 2/2 SIADH. Cannot rule out other causes of SIADH such as CNS infection, so will evaluate with fluid bolus to assess renal response.  - 500 cc bolus NS, recheck BMP    Discharge planning: placement and clinical improvement  Foley: No foley catheter  VTE prophylaxis: LMWH  Diet: Diet Regular  Last BM: 8/26  IV fluids: No IV fluids  Access: PIVs  Code status: Full  Code    [name], MSIV  Patient care was discussed in detail with Talbert ForestMcIntyre, Jonathan Stewa*.

## 2019-07-08 NOTE — Progress Notes (Addendum)
Vancomycin Initiation Note  Vancomycin Indication: Bone and Joint Infection - thoracolumbar osteomyelitis, MRSA bacteremia (Goal: Trough 15 - 39 mg/L)  43 year old male, Height: 5\' 6"  (167.6 cm), Weight: 72.6 kg (160 lb), Kidney Function: SCr: 0.82 mg/dL, at baseline.  Culture results: 8/23 BCx - MRSA (MIC of 1 mcg/mL)    Assessment / Plan:    Initial Pharmacokinetic Parameters: Volume of distribution: 47.1 L, Clearance: 5.2 L/h, Half-life: 6.2h   Pt is being transitioned from daptomycin to vancomycin. Previous daptomycin dose was administered on 8/27 @2000 .   Will start vancomycin 750mg  x1 @1330  and then start vancomycin 750mg  IV Q6H at @1800  (standard dosing times) afterwards for an estimated steady state trough of 14.7 mg/L which is subtherapeutic, but AUIC of 571 which is therapeutic (8/23 BCx has grown MRSA with MIC of 1 mcg/mL).   Monitor kidney function and plan trough level on 8/30 or sooner if clinically indicated.    Pharmacist will continue to monitor and make adjustments as needed.    Sharlot Gowda, PharmD  PGY1 Acute Care Pharmacy Resident  [facility]

## 2019-07-09 ENCOUNTER — Ambulatory Visit (HOSPITAL_BASED_OUTPATIENT_CLINIC_OR_DEPARTMENT_OTHER): Payer: Medicaid Other

## 2019-07-09 LAB — BASIC METABOLIC PANEL, BLOOD
Anion Gap: 12 mmol/L (ref 7–15)
BUN: 12 mg/dL (ref 6–20)
Bicarbonate: 25 mmol/L (ref 22–29)
Calcium: 9.3 mg/dL (ref 8.5–10.6)
Chloride: 93 mmol/L — ABNORMAL LOW (ref 98–107)
Creatinine: 0.78 mg/dL (ref 0.67–1.17)
GFR: >60 mL/min
Glucose: 91 mg/dL (ref 70–99)
Potassium: 3.8 mmol/L (ref 3.5–5.1)
Sodium: 130 mmol/L — ABNORMAL LOW (ref 136–145)

## 2019-07-09 LAB — CBC WITH DIFF, BLOOD
ANC-Automated: 3.4 10*3/uL (ref 1.6–7.0)
Abs Basophils: 0 10*3/uL
Abs Eosinophils: 0.1 10*3/uL (ref 0.0–0.5)
Abs Lymphs: 0.8 10*3/uL (ref 0.8–3.1)
Abs Monos: 0.5 10*3/uL (ref 0.2–0.8)
Basophils: 0 %
Eosinophils: 2 %
Hct: 41.2 % (ref 40.0–50.0)
Hgb: 13.4 gm/dL — ABNORMAL LOW (ref 13.7–17.5)
Lymphocytes: 17 %
MCH: 28.9 pg (ref 26.0–32.0)
MCHC: 32.5 g/dL (ref 32.0–36.0)
MCV: 88.8 um3 (ref 79.0–95.0)
MPV: 8.4 fL — ABNORMAL LOW (ref 9.4–12.4)
Monocytes: 10 %
Plt Count: 213 10*3/uL (ref 140–370)
RBC: 4.64 10*6/uL (ref 4.60–6.10)
RDW: 14.3 % — ABNORMAL HIGH (ref 12.0–14.0)
Segs: 71 %
WBC: 4.9 10*3/uL (ref 4.0–10.0)

## 2019-07-09 LAB — BLOOD CULTURE
Blood Culture Result: NO GROWTH
Blood Culture: DETECTED — AB

## 2019-07-09 LAB — MAGNESIUM, BLOOD: Magnesium: 1.8 mg/dL (ref 1.6–2.6)

## 2019-07-09 LAB — PHOSPHORUS, BLOOD: Phosphorous: 3.3 mg/dL (ref 2.7–4.5)

## 2019-07-09 MED ORDER — SODIUM CHLORIDE 0.9 % IJ SOLN (CUSTOM)
10.0000 mL | INTRAMUSCULAR | Status: DC | PRN
Start: 2019-07-09 — End: 2019-07-25
  Administered 2019-07-09 – 2019-07-19 (×2): 10 mL via INTRAVENOUS

## 2019-07-09 MED ORDER — LIDOCAINE HCL 1 % IJ SOLN
5.0000 mL | Freq: Once | INTRAMUSCULAR | Status: AC | PRN
Start: 2019-07-09 — End: 2019-07-10

## 2019-07-09 MED ORDER — DIPHENHYDRAMINE HCL 25 MG OR TABS OR CAPS CUSTOM
25.0000 mg | ORAL_CAPSULE | Freq: Once | ORAL | Status: AC | PRN
Start: 2019-07-09 — End: 2019-07-09
  Administered 2019-07-09: 06:00:00 25 mg via ORAL
  Filled 2019-07-09: qty 1

## 2019-07-09 MED ORDER — HEPARIN SODIUM LOCK FLUSH 100 UNIT/ML IJ SOLN CUSTOM
100.0000 [IU] | Freq: Once | INTRAVENOUS | Status: DC | PRN
Start: 2019-07-09 — End: 2019-07-25

## 2019-07-09 MED ORDER — SODIUM CHLORIDE 0.9 % IJ SOLN (CUSTOM)
10.0000 mL | Freq: Three times a day (TID) | INTRAMUSCULAR | Status: DC
Start: 2019-07-09 — End: 2019-07-25
  Administered 2019-07-11 – 2019-07-24 (×37): 10 mL

## 2019-07-09 NOTE — Plan of Care (Signed)
Problem: Promotion of Health and Safety  Goal: Promotion of Health and Safety  Description: The patient remains safe, receives appropriate treatment and achieves optimal outcomes (physically, psychosocially, and spiritually) within the limitations of the disease process by discharge.    Information below is the current care plan.  Outcome: Progressing  Flowsheets  Taken 07/09/2019 1022 by Joanna Puff, RN  Patient /Family stated Goal: "I dont like having to get Vanco so many times a day and I need a new PIV and I hate being poked so many times"  Individualized Interventions/Recommendations #2 (if applicable): Pt emotional and frustrated. Listened to Patients concerned. Notified MD.  Individualized Interventions/Recommendations #3 (if applicable): FallPrecautions in place. Call light within reach. Encouraged to call RN for assistance.  Outcome Evaluation (rationale for progressing/not progressing) every shift: Progressing. VSS. No signs of a cute distress. Pt frustrated with PIVs and IV ABX. MD aware. Educated Pt on importance of IX ABX.  Taken 07/09/2019 0345 by Alda Berthold, RN  Individualized Interventions/Recommendations #1: Encourage patient to call for assistance as needed.

## 2019-07-09 NOTE — Interdisciplinary (Signed)
Patient upset with how many times he needs to have vancomycin. Complains his PIV is bothering him. No phlebitis at site. Pt states "Im going to need another PIV" and numerous complaints about being "sticked" numerous times and how he doesn't like vancomycin. Pt requesting to speak to MD to voice concerns.     Notified MD by secure chat.

## 2019-07-09 NOTE — Interdisciplinary (Addendum)
Notified MD via secure chat: Pt now agrees to have PICC placed if possible. Pt has IV Vanco  scheduled at 1200 and no PIV.     Paged first to call MD: Dulcy Fanny. Messaged earlier via secure chat. Pt now agrees to have PICC placed if possible. Pt has IV Vanco  scheduled at 1200 and no PIV.     Paged PICC team: Dulcy Fanny. Pt has order for PICC line placement and past due on IV ABX. Pls let RN know if possible today.Thank You.     Paged on-call Vascular team [phone]) : Dulcy Fanny. Pt has order for PICC line placement and past due on IV ABX. Pls let RN know if possible today.Thank You. MRN# [id number]. [facility] 6E     Paged first to call MD; Clarisa Fling. FYI: Paged PICC team and on-call Vascular team for PICC placement numerous times. No response. Pt hardstick and refusing RN to place PIV. Missed 1200 dose Vanco.     Pt agreed to have RN from ICU to attempt PIV. Cytogeneticist to find assistance with PIV.     Santee notified Charge RN that ICU RN should be up to attempt PIV shortly.

## 2019-07-09 NOTE — Progress Notes (Signed)
Hospital Medicine Daily Progress Note    ID:  Brian Ritter is a75 year oldmale with hx ofankylosing spondylitis, Ehler's Danlos, autism, and recent spinal fusion surgery c/b postop seroma with MRSA s/p I&D 06/13/19 and initiation of daptomycin twice with readmissions for trouble adhering to outpatient infusion therapy, who was admitted with MRSA bacteremia with negative repeat BCx.    INTERVAL EVENTS: no acute overnight events    SUBJECTIVE:  Patient tired this morning. Not tolerating PIV vancomycin infusion, now amenable to PICC placement for continued antibiotic infusions. Denies fevers, chills, SOB,  nausea, vomiting.    OBJECTIVE:  BP  Min: 102/65  Max: 115/79  Temp  Min: 98 F (36.7 C)  Max: 98.6 F (37 C)  Pulse  Min: 92  Max: 106  Resp  Min: 16  Max: 18  SpO2  Min: 95 %  Max: 100 %    BP 111/70 (BP Location: Left arm, BP Patient Position: Semi-Fowlers)   Pulse 92   Temp 98.2 F (36.8 C)   Resp 18   Ht 5\' 6"  (1.676 m)   Wt 72.6 kg (160 lb)   SpO2 98%   BMI 25.82 kg/m        Physical Examination:  General: resting comfortably in bed, initially sleeping, answering questions appropriately  HEENT: NCAT, moist mucous membranes, oropharynx without erythema or exudates  Cardiovascular: Regular rate and rhythm. No murmurs appreciated. Extremities warm and well-perfused. No lower extremity edema.  Respiratory: Good air entry bilaterally. No prolonged expiratory phase. No wheezes appreciated. No crackles appreciated.  Gastrointestinal: + bowel sounds. Soft, non-distended. No tenderness to palpation. No palpable masses appreciated.  Skin: no lesions or rashes    Glucose:  No results for input(s): GLUCPOCT in the last 72 hours.    Pertinent laboratory, imaging, and procedure data: recent results reviewed in chart.    Na 130* (08/29) CL 93* (08/29) BUN 12 (08/29) GLU   91 (08/29)   K 3.8 (08/29) CO2 25 (08/29) Cr 0.78 (08/29)      WBC 4.9 (08/29) HGB 13.4* (08/29) PLT 213 (08/29)    HCT 41.2 (08/29)      BCx  8/23: GP cocci in clusters, presence of MRSA DNA  BCx 8/24-8/28 NGTD      ASSESSMENT AND PLAN:  Brian Ritter is a4 year oldmale with hx ofankylosing spondylitis, Ehler's Danlos, autism, and recent spinal fusion surgery c/b postop seroma with MRSA s/p I&D 06/13/19 and initiation of daptomycin twice with readmissions for trouble adhering to outpatient infusion therapy, who was admitted with MRSA bacteremia with negative repeat BCx.    #MRSA T5-L3 seroma infection  MRSA seroma infection from prior spinal surgery has not been adequately treated with abx due to difficulty with f/u. Has failed to complete IV abx therapy as outpatient, so will refer to SNF or LTAC or keep patient in hospital. He was initially treated with IV daptomycin due to ease of qday dosing as outpatient, but no indication for qday dosing if he can be treated in a facility.  - ID consulted, now signing off  - Continue IV vancomycin, per ID  - Ortho and neurosurgery consulted, do not recommend surgical intervention at this time  - Neuro checks q8h  - Pain control: oxycodone 5-10mg  PO PRN, tylenol 975mg  q8h, topical lidocaine  - CM consulted for dispo plan: will likely require inpatient IV abx given poor adherence as outpatient  - PICC order placed    #Hyponatremia  Low Na with low Cl and  bicarb suggestive of dilution, possibly 2/2 SIADH.   - CTM  - Encourage PO intake  - Consider urine lytes if persistenting    # Discharge planning: pending placement for IV abx infusion  # VTE prophylaxis: Lovenox 40mg  SQ daily  # Diet: Regular  # Foley: none  # Code status: FC/FC    Pricilla HandlerArmando R. Ritter Min, MD  Internal Medicine, PGY3

## 2019-07-09 NOTE — Plan of Care (Signed)
Problem: Promotion of Health and Safety  Goal: Promotion of Health and Safety  Description: The patient remains safe, receives appropriate treatment and achieves optimal outcomes (physically, psychosocially, and spiritually) within the limitations of the disease process by discharge.    Information below is the current care plan.  Outcome: Progressing  Flowsheets  Taken 07/09/2019 0345  Guidelines: Inpatient Nursing Guidelines  Individualized Interventions/Recommendations #1: Encourage patient to call for assistance as needed.  Individualized Interventions/Recommendations #2 (if applicable): Remind patient when next prn pain medicine is due.  Individualized Interventions/Recommendations #3 (if applicable): Educate patient on importance of getting antibiotics on time and not skipping doses.  Outcome Evaluation (rationale for progressing/not progressing) every shift: Progressing. Pt almost refused midnight dose of IV antibiotics. With education, patient compliant with meds. Pt has been awake all night using iPad and phone. Patient able to make needs known.  Taken 07/08/2019 2015  Patient /Family stated Goal: "I don't take tylenol. I'll take the rescue if you have that"

## 2019-07-10 ENCOUNTER — Ambulatory Visit (HOSPITAL_BASED_OUTPATIENT_CLINIC_OR_DEPARTMENT_OTHER): Payer: Medicaid Other

## 2019-07-10 LAB — MAGNESIUM, BLOOD: Magnesium: 1.9 mg/dL (ref 1.6–2.6)

## 2019-07-10 LAB — BASIC METABOLIC PANEL, BLOOD
Anion Gap: 14 mmol/L (ref 7–15)
BUN: 14 mg/dL (ref 6–20)
Bicarbonate: 19 mmol/L — ABNORMAL LOW (ref 22–29)
Calcium: 9.3 mg/dL (ref 8.5–10.6)
Chloride: 97 mmol/L — ABNORMAL LOW (ref 98–107)
Creatinine: 0.91 mg/dL (ref 0.67–1.17)
GFR: >60 mL/min
Glucose: 87 mg/dL (ref 70–99)
Potassium: 4.4 mmol/L (ref 3.5–5.1)
Sodium: 130 mmol/L — ABNORMAL LOW (ref 136–145)

## 2019-07-10 LAB — BLOOD CULTURE
Blood Culture Result: NO GROWTH
Blood Culture Result: NO GROWTH

## 2019-07-10 LAB — PHOSPHORUS, BLOOD: Phosphorous: 3.6 mg/dL (ref 2.7–4.5)

## 2019-07-10 MED ORDER — VANCOMYCIN HCL 750 MG IV SOLR
750.0000 mg | Freq: Four times a day (QID) | INTRAVENOUS | Status: DC
Start: 2019-07-10 — End: 2019-07-18
  Administered 2019-07-10 – 2019-07-18 (×33): 750 mg via INTRAVENOUS
  Filled 2019-07-10 (×31): qty 750

## 2019-07-10 NOTE — Progress Notes (Cosign Needed)
MEDICINE DAILY PROGRESS NOTE     Hospital day:   6 days - Admitted on: 07/03/2019    Brian Ritter is a43 year oldmale with hx ofankylosing spondylitis, Ehler's Danlos, autism, and recent spinal fusion surgery c/b postop seroma with MRSA s/p I&D 06/13/19 and initiation of daptomycin twice with readmissions for trouble adhering to outpatient infusion therapy, who was admitted with MRSA bacteremia with negative repeat BCx.    24 Hour - Interval Events   None    Subjective   - Less pain than yesterday, no new numbness, weakness, or pain    Physical Exam   Temp  Min: 98 F (36.7 C)  Max: 98.8 F (37.1 C)  Pulse  Min: 52  Max: 94  BP  Min: 109/61  Max: 126/64  Resp  Min: 18  Max: 20  SpO2  Min: 98 %  Max: 100 %    BP 110/69 (BP Location: Right arm, BP Patient Position: Semi-Fowlers)   Pulse 89   Temp 98.3 F (36.8 C)   Resp 18   Ht 5\' 6"  (1.676 m)   Wt 72.6 kg (160 lb)   SpO2 99%   BMI 25.82 kg/m  O2 Device: None (Room air)      08/29 0600 - 08/30 0559  In: 553 [P.O.:300; I.V.:253]  Out: -   Urine x 0 Stool x 0 Emesis x 0     GENERAL EXAM: NAD, well-developed, well-nourished  SKIN: Tenderness to palpation and warmth over surgical incision in back, no purulent drainage, fluctuance, or dehiscence appreciated. Erythema and edema improved. Also has TTP, edema, and induration on L arm at 2 sites of former PIV sites. Induration appears to follow vein with cord-like consistency.  CARDIAC: RRR. Normal S1, S2, no m/r/g  EXTREMITIES: WWP, no peripheral edema    NEUROLOGIC EXAM:  Mental Status: Awake, fluent, conversant. Normal language output and comprehension    Motor  Normal bulk and tone  UPPER EXT  Deltoids  Infrasinatus  Biceps  Triceps  Wrist extensors  Wrist flexors  FDI  ADM  APB  Finger extensors  FDP median  FDP unlar R/L  *  *  *  *  *  *  *  *  *  *  *  * LOWER EXT  Iliopsoas  Hip abductors  Hip adductors  Quadriceps  Hamstrings  Tibialis anterior  Gastroc   Foot eversion  Foot inversion  EHL  R/L  3/4  *  *  *  *  *  *  *  *  *       * Not tested    Somatosensory  Patchy areas of numbness over RLE, worse distally and over thigh    Pertinent Labs and Imaging and Meds     WBC 4.9 (08/29) HGB 13.4* (08/29) PLT 213 (08/29)    HCT 41.2 (08/29)      Na 130* (08/30) CL 97* (08/30) BUN 14 (08/30) GLU   87 (08/30)   K 4.4 (08/30) CO2 19* (08/30) Cr 0.91 (08/30)      BCx 8/23: GP cocci in clusters, presence of MRSA DNA  BCx 8/24-8/28 NGTD    MRI thoracic and lumbar spine w/ and w/o contrast 07/04/19  1. Enhancement and incomplete fat suppression within the dorsal epidural fat from T6-T8, is concerning for infection/phlegmon within the epidural space; no visible epidural abscess within the limits of hardware artifact. Continued paraspinal inflammation and bilateral pleural effusions, without visible posterior mediastinal/pleural/retroperitoneal abscess.  Dr. Dionisio Paschal was paged to alert him of findings at 1730 hours on 07/04/2019.    2. Persistent mild enhancement of the ventral cauda equina nerve roots as seen on prior MRI 06/21/2019.    3. Redemonstration of posterior decompression and instrumented fusion from T8-L1 with interval decrease in the size of a postoperative fluid collection in the midline subcutaneous tissues.    4. Multilevel degenerative changes without high-grade central canal stenosis in either the thoracic or lumbar spine, within the limitations of metallic artifact from the patient's spinal fusion hardware.    Scheduled Medications  . acetaminophen  975 mg Q8H   . enoxaparin  40 mg Daily   . polyethylene glycol  17 g Daily   . sodium chloride  10 mL Q8H   . sodium chloride  3 mL Q8H   . vancomycin (VANCOCIN) IVPB  750 mg Q6H     Continuous Medications  . sodium chloride       PRN Medications  . heparin  100 Units Once PRN   . heparin  100 Units Once PRN   . lidocaine   Q4H PRN   . lidocaine  5 mL Once PRN   . nalOXone  0.1 mg Q2 Min PRN   . oxyCODONE  10 mg Q4H PRN   . oxyCODONE  5 mg Q4H PRN   .  oxyCODONE  5 mg Q4H PRN   . sodium chloride  10 mL PRN   . sodium chloride  3 mL PRN   . sodium chloride   Continuous PRN   . tiZANidine  4 mg Q6H PRN     Assessment / Plan   Brian Ritter is a79 year oldmale with hx ofankylosing spondylitis, Ehler's Danlos, autism, and recent spinal fusion surgery c/b postop seroma with MRSA s/p I&D 06/13/19 and initiation of daptomycin twice with readmissions for trouble adhering to outpatient infusion therapy, who was admitted with MRSA bacteremia with negative repeat BCx.    #MRSA T5-L3 seroma infection  Presence of MRSA in blood cx on admission suggestive of bacteremia and hardware infection, but WBC WNL and febrile only once to 100.3. Multiple negative repeat BCx suggests that infection cleared from blood. Exam and MRI reassuring for cauda equina syndrome. Ortho and neurosurgery consulted, do not recommend surgical intervention at this time. TTE negative for endocarditis.   - IV vancomycin x 6 weeks, per ID (IV abx restarted 8/24)  - Pain control: oxycodone 5-10mg  PO PRN, tylenol 975mg  q8h, topical lidocaine  - Place PICC tomorrow  - CM consulted for dispo plan    #IV site reaction  Erythema, tenderness, and induration in cord-like pattern near sites of IV abx administration suggestive of thrombophlebitis. Differential also includes contact dermatitis from tape.  - Place PICC tomorrow  - If problem continues, consider using alternatives to medical tape    #Hyponatremia  Low Na with low Cl and bicarb suggestive of dilution, but Na did not change after administration of NS fluid bolus. Most likely etiology SIADH due to infection and poor PO intake. Low concern for concerning causes of SIADH (no meningismus or cranial nerve deficits, HIV negative 05/2019). No symptoms of hyponatremia, so will CTM.  - CTM with daily BMP  - Education regarding free water intake    Discharge planning: placement and clinical improvement  Foley: No foley catheter  VTE prophylaxis: LMWH  Diet: Diet  Regular  Last BM: 8/29  IV fluids: No IV fluids  Access: PIVs  Code status: Full Code  Brayton LaymanSonya Daryl Beehler, MSIV  Patient care was discussed in detail with Talbert ForestMcIntyre, Jonathan Stewa*.

## 2019-07-10 NOTE — Interdisciplinary (Signed)
Nutrition Note     Note Type: Initial Screening      HPI Per MD:59 year oldmale with hx ofankylosing spondylitis, Ehler's Danlos, autism, and recent spinal fusion surgery c/b postop seroma with MRSA s/p I&D 06/13/19 and initiation of daptomycin twice with readmissions for trouble adhering to outpatient infusion therapy, who was admitted with MRSA bacteremia with negative repeat BCx.     Nutrition Summary:     Current Diet Rx: Diet Regular  Source of Information: Chart Review;Unable to interview patient  Barriers to Intake: None: good appetite & oral intake        Tray Items Taken for the past 168 hrs:   Number of Items Taken Number of Items on Tray Diet Tolerance   07/04/19 1826 2 5 Tolerates   07/05/19 0800 5 5 Tolerates   07/05/19 1459 5 5 Tolerates   07/06/19 0827 3 4 Tolerates   07/06/19 1300 3 4 Tolerates   07/07/19 0800 3 3 Tolerates   07/07/19 1500 2 3 Tolerates   07/07/19 1800 -- -- Tolerates   07/08/19 0900 2 3 Tolerates   07/08/19 1330 2 4 Tolerates   07/09/19 0800 1 4 Tolerates   07/09/19 1324 2 3 Tolerates     No data found.  No data found.  Medication Review Comments: reviewed    Anthropometrics:  Height: 5\' 6"  (167.6 cm)  Weight For Nutrition Equations: 72.6 kg (160 lb)  Weight for Equations Reflects?: wt 8/23  Ideal Body Weight (kg): 64.36  Percent of Ideal Body Weight: 112.76 %  BMI for Nutrition Calculations: 25.82              Wt Readings from Last 20 Encounters:   07/03/19 72.6 kg (160 lb)   06/28/19 76.7 kg (169 lb 3.2 oz)   06/25/19 74.8 kg (165 lb)   06/24/19 76.2 kg (168 lb)   06/21/19 76.9 kg (169 lb 8.5 oz)       Clinical Considerations:   Allergies:   Allergies   Allergen Reactions    Wound Dressing Adhesive Hives       GI:  Stool Assessment for the past 168 hrs:   Stool Amount Stool Occurrence Stool Color   07/04/19 1826 -- 0 --   07/06/19 0841 -- 1 --   07/07/19 0800 -- 0 --   07/08/19 1000 Small 1 Brown   07/09/19 0530 -- 1 --       Skin Integrity:  Skin Integrity (WDL): Exceptions to  WDL  Skin Integrity  Skin Color: Normal for ethnicity  Skin Temp: Warm  Generalized Skin Integrity: Ecchymosis;Other (Comment)(back incision. OTA )    Wounds/Incisions:       Pressure Injuries:       Edema:         Labs: reviewed   Recent Labs     07/08/19  0620 07/08/19  1720 07/09/19  0727 07/10/19  0611   NA 133* 133* 130* 130*   K 4.2 3.9 3.8 4.4   CL 94* 97* 93* 97*   BICARB 26 25 25  19*   BUN 13 11 12 14    CREAT 0.82 0.86 0.78 0.91   GLU 117* 100* 91 87   [place] 9.2 8.9 9.3 9.3   MG 1.9  --  1.8 1.9   PHOS 2.7  --  3.3 3.6   WBC 5.7  --  4.9  --    ABSNEUTRO 4.5  --  3.4  --  No results found for: CHOL, HDL, LDLCALC, TRIG, LDLDIRECT    No results found for: A1C    No results for input(s): GLUCPOCT in the last 72 hours.    No results found for: VITD25HYDROX, VITAMIND25HY, VD2, VD3, VDT    Medications  IV:    sodium chloride       Scheduled:    acetaminophen  975 mg Q8H    enoxaparin  40 mg Daily    polyethylene glycol  17 g Daily    sodium chloride  10 mL Q8H    sodium chloride  3 mL Q8H    vancomycin (VANCOCIN) IVPB  750 mg Q6H       Discharge: pending clinical course,      Education: when clinically appropriate, provided education on  , handout provided from Nutrition Care Manual or Elsevier,      RD/DTR to monitor/evaluate: labs, wt trend, and po/nutrition support status, and S/S of new skin concerns.  Relayed findings to RD.    Will continue to follow patient per approved [place] Nutrition Prioritization Schedule guidelines. Nutrition Services remains available via IP Nutrition Consult should patient medical status change.    Charlesetta IvoryVahania Gomez Samaniego, DTR    07/10/19

## 2019-07-10 NOTE — Interdisciplinary (Signed)
Dr, Shonna Chock was notified re: pt's refusal to get his Lovenox injection.

## 2019-07-10 NOTE — Plan of Care (Signed)
Problem: Promotion of Health and Safety  Goal: Promotion of Health and Safety  Description: The patient remains safe, receives appropriate treatment and achieves optimal outcomes (physically, psychosocially, and spiritually) within the limitations of the disease process by discharge.    Information below is the current care plan.  Flowsheets  Taken 07/10/2019 1610 by Devonne Doughty, RN  Outcome Evaluation (rationale for progressing/not progressing) every shift: Pt resting comfortably in bed, VSS, A&Ox4, prn oxy given for back pain. ICU nurse inserted IV at 21:00.  Taken 07/09/2019 1946 by Devonne Doughty, RN  Patient /Family stated Goal: get abx  Taken 07/09/2019 1022 by Joanna Puff, RN  Individualized Interventions/Recommendations #3 (if applicable): FallPrecautions in place. Call light within reach. Encouraged to call RN for assistance.  Taken 07/09/2019 0345 by Alda Berthold, RN  Individualized Interventions/Recommendations #1: Encourage patient to call for assistance as needed.  Taken 07/06/2019 0130 by Laurey Morale, RN  Individualized Interventions/Recommendations #4 (if applicable): fall precautions maintained

## 2019-07-10 NOTE — Plan of Care (Signed)
Problem: Promotion of Health and Safety  Goal: Promotion of Health and Safety  Description: The patient remains safe, receives appropriate treatment and achieves optimal outcomes (physically, psychosocially, and spiritually) within the limitations of the disease process by discharge.    Information below is the current care plan.  Outcome: Progressing  Flowsheets  Taken 07/10/2019 1632  Individualized Interventions/Recommendations #1: Updated pt w/ poc prn.  Individualized Interventions/Recommendations #2 (if applicable): Reminded pt to call for help as needed/ when in pain.  Individualized Interventions/Recommendations #3 (if applicable): Medicated pt for pain prn.  Individualized Interventions/Recommendations #4 (if applicable): Maintained pt on fall precaution.  Outcome Evaluation (rationale for progressing/not progressing) every shift: Pt verbalized  understanding of the plan. Pt refused the bed alarm  , but remained free from injury and aware to call when help is needed/ in pain.  Pt appeared to be resting comfoprtably in bed w/ his eyes closed an hour after pain med was given.  Pt still on Vanco iv abx.  Goal ongoing.  Taken 07/10/2019 0800  Patient /Family stated Goal: "Get some rest."

## 2019-07-10 NOTE — Interdisciplinary (Signed)
Pt was reminded to call the nurse when iv abx is done bec his piv needs to be flushed before and after using it.

## 2019-07-11 ENCOUNTER — Inpatient Hospital Stay (HOSPITAL_COMMUNITY): Payer: Medicaid Other

## 2019-07-11 ENCOUNTER — Ambulatory Visit (HOSPITAL_BASED_OUTPATIENT_CLINIC_OR_DEPARTMENT_OTHER): Payer: Medicaid Other

## 2019-07-11 DIAGNOSIS — Z452 Encounter for adjustment and management of vascular access device: Secondary | ICD-10-CM

## 2019-07-11 LAB — BLOOD CULTURE: Blood Culture Result: NO GROWTH

## 2019-07-11 LAB — CBC WITH DIFF, BLOOD
ANC-Automated: 5.4 10*3/uL (ref 1.6–7.0)
Abs Basophils: 0 10*3/uL
Abs Eosinophils: 0.1 10*3/uL (ref 0.0–0.5)
Abs Lymphs: 1.2 10*3/uL (ref 0.8–3.1)
Abs Monos: 0.8 10*3/uL (ref 0.2–0.8)
Basophils: 0 %
Eosinophils: 2 %
Hct: 38.8 % — ABNORMAL LOW (ref 40.0–50.0)
Hgb: 12.7 gm/dL — ABNORMAL LOW (ref 13.7–17.5)
Lymphocytes: 16 %
MCH: 28.7 pg (ref 26.0–32.0)
MCHC: 32.7 g/dL (ref 32.0–36.0)
MCV: 87.8 um3 (ref 79.0–95.0)
MPV: 9.2 fL — ABNORMAL LOW (ref 9.4–12.4)
Monocytes: 10 %
Plt Count: 214 10*3/uL (ref 140–370)
RBC: 4.42 10*6/uL — ABNORMAL LOW (ref 4.60–6.10)
RDW: 14.1 % — ABNORMAL HIGH (ref 12.0–14.0)
Segs: 71 %
WBC: 7.5 10*3/uL (ref 4.0–10.0)

## 2019-07-11 LAB — MAGNESIUM, BLOOD: Magnesium: 1.8 mg/dL (ref 1.6–2.6)

## 2019-07-11 LAB — BASIC METABOLIC PANEL, BLOOD
Anion Gap: 12 mmol/L (ref 7–15)
BUN: 14 mg/dL (ref 6–20)
Bicarbonate: 23 mmol/L (ref 22–29)
Calcium: 9.2 mg/dL (ref 8.5–10.6)
Chloride: 95 mmol/L — ABNORMAL LOW (ref 98–107)
Creatinine: 0.8 mg/dL (ref 0.67–1.17)
GFR: >60 mL/min
Glucose: 149 mg/dL — ABNORMAL HIGH (ref 70–99)
Potassium: 3.8 mmol/L (ref 3.5–5.1)
Sodium: 130 mmol/L — ABNORMAL LOW (ref 136–145)

## 2019-07-11 LAB — PHOSPHORUS, BLOOD: Phosphorous: 3.2 mg/dL (ref 2.7–4.5)

## 2019-07-11 LAB — VANCOMYCIN, TROUGH: Vancomycin, Trough: 17.1 ug/mL (ref 10.0–20.0)

## 2019-07-11 LAB — APTT, BLOOD: PTT: 36 s — ABNORMAL HIGH (ref 25–34)

## 2019-07-11 LAB — PROTHROMBIN TIME, BLOOD
INR: 1.1
PT,Patient: 13 s — ABNORMAL HIGH (ref 9.7–12.5)

## 2019-07-11 NOTE — Interdisciplinary (Signed)
07/11/19 1500   Assessment   Assessment Type Initial   Referral Information   Referral Type Discharge Planning   Social Assessment   Primary Caretaker(s) * Self;Spouse/Partner   Interpreter Used? Not Needed   Social Determinates of [place]  (Pt lives in car with his partner)   Post Acute Resources Provided Patient Declined   A List of Tax adviser Provided Patient Financial risk analyst Provided No - Clothing Adequate   Meal Provided Prior to Discharge Yes   Discharge Destination Details MD requesting SNF - pt refusing   Homeless Discharge Documentation Complete CM/SW Completed     SW received secure chat from MD to assist with discharge planning for pt. SW reviewed chart prior to meeting with pt. Pt has been refusing MD recommendation for SNF. SW met with pt at bedside, introduced self and role. Pt was laying in bed on side, A&Ox4. Pt presented with agitation stating that he "already told them 100 times" that he was not willing to go to a SNF or and ILF or any place where he needed to share a room. Pt states that he is triggered by smells, sounds, and people due to his autism. Pt had poor eye contact and was not willing to engage further about housing options. Pt reports that he lives with his partner in a camper [name], where his partner is currently staying.     MD reported that pt may be getting assistance with housing from [facility]. SW inquired about [employer]. Pt reports that he contacted [employer] for assistance on housing Friday (8/28). Pt reports that he would like [employer] to pay for his rent. SW reviewed [employer]FS programs and attempted to reality test with pt about his options. Pt reports that he did not have an intake, only spoke with someone on the phone, but still hopeful that they will meet his financial need. SW explored further resource options. Pt denied being a part of a case management or any other community based program. Pt appeared  unwilling to work with SW in order to create a plan for safe discharge.     Plan:   Treatment team will continue to work with pt to assist in discharge needs  Pt receiving IV abx

## 2019-07-11 NOTE — Plan of Care (Signed)
Problem: Promotion of Health and Safety  Goal: Promotion of Health and Safety  Description: The patient remains safe, receives appropriate treatment and achieves optimal outcomes (physically, psychosocially, and spiritually) within the limitations of the disease process by discharge.    Information below is the current care plan.  Outcome: Progressing  Flowsheets  Taken 07/11/2019 1200  Individualized Interventions/Recommendations #1: Update patient with the POC, PICC placement  Individualized Interventions/Recommendations #2 (if applicable): Assess pain and evaluate effectiveness of the PRN  Individualized Interventions/Recommendations #3 (if applicable): Remind patient to call for assistance when feels weak or unsteady  Individualized Interventions/Recommendations #4 (if applicable): Apply safety precautions  Individualized Interventions/Recommendations #5 (if applicable): Monitor signs of infection  Outcome Evaluation (rationale for progressing/not progressing) every shift: Pt sleeping mos tof the time today. Had episode of pain, responded well from the Oxycodone PRN given. PICC line placed , waiting for x-ray. Patient remains afebrile and other V/S stable. DC pending clinical improvement. Remains on IV Antibiotic, Vancomycin.  Taken 07/11/2019 0820  Patient /Family stated Goal: " Get better"

## 2019-07-11 NOTE — Interdisciplinary (Signed)
07/11/19 1015   Follow Up/Progress   Is the Patient Ready for Discharge * No   Anticipated Discharge Dispostion/Needs SNF;Other  (Pt has not decided if he wants to go to SNF.)   Post Acute Services Referred To Facility/SNF   Barriers to Discharge * Placement   Patient/Family/Legal/Surrogate Decision Maker Has Been Given a List Options And Choice In The Selection of Post-Acute Care Providers * Yes   Family/Caregiver's Assessed for * Not Applicable   Respite Care * Not Applicable   Patient/Family/Other Are In Agreement With Discharge Plan * To be determined   Public Health Clearance Needed * Not Applicable   58/30/94  07:68 AM    Medical Intervention(s) requiring continued Hospital Stay:  - IV vancomycin    Anticipated dispo plan (SNF, Home, ILF, etc.) and anticipated DC needs (None, HHPT, DME, etc.):  - SNF      Barriers to Discharge:  - PICC placement pending  - Pt non-compliant and has left facilities AMA    The [facility] willing to take Pt.  CM spoke with Pt re: DC to the [facility].  Pt replied, "Probably not"  and stated he is looking for a place for him and his partner.  CM asked if SNF can contact Pt.  Pt agreed.  [place]inta at the [facility] to call Pt.       Lonia Mad, RN  Care Manager

## 2019-07-11 NOTE — Progress Notes (Signed)
Pharmacokinetics Note - Vancomycin  Vancomycin Indication: MRSA sarcoma (Goal: Trough 15 - 20 mg/L), started 8/28  Vancomycin level: 17.1 mg/L on 07/11/19 at 0530, trough, at steady state  Kidney Function: SCr: 0.8 mg/dL, at baseline.  Culture results: 07/03/2019 BCx - MRSA (MIC = 1 mcg/mL)    Assessment / Plan:    Pharmacokinetic Parameters: Volume of distribution: 47 L, Clearance: 5 L/h, Half-life: 6.7 h   Current regimen of 750 mg Q6H gives an estimated trough at steady state of 16 and an AUC of 611, which is therapeutic.    Will continue current dose. Monitor kidney function and plan trough level on 07/15/19 @0530  or sooner if clinically indicated.    Pharmacist will continue to monitor and make adjustments as needed.    Wyline Copas, PharmD  PGY1 Pharmacy Resident  Pager: [phone]

## 2019-07-11 NOTE — Plan of Care (Signed)
Problem: Promotion of Health and Safety  Goal: Promotion of Health and Safety  Description: The patient remains safe, receives appropriate treatment and achieves optimal outcomes (physically, psychosocially, and spiritually) within the limitations of the disease process by discharge.    Information below is the current care plan.  Flowsheets  Taken 07/11/2019 0213 by Devonne Doughty, RN  Outcome Evaluation (rationale for progressing/not progressing) every shift: Pt resting comfortably in bed, VSS, A&OX4, prn oxy given for severe back pain, IV abx continued  Taken 07/10/2019 1959 by Devonne Doughty, RN  Patient /Family stated Goal: get abx  Taken 07/10/2019 1632 by Lavone Orn, RN  Individualized Interventions/Recommendations #1: Updated pt w/ poc prn.  Individualized Interventions/Recommendations #2 (if applicable): Reminded pt to call for help as needed/ when in pain.  Individualized Interventions/Recommendations #3 (if applicable): Medicated pt for pain prn.  Individualized Interventions/Recommendations #4 (if applicable): Maintained pt on fall precaution.

## 2019-07-11 NOTE — Progress Notes (Signed)
MEDICINE DAILY PROGRESS NOTE     Hospital day:   7 days - Admitted on: 07/03/2019    Brian Ritter is a43 year oldmale with hx ofankylosing spondylitis, Ehler's Danlos, autism, and recent spinal fusion surgery c/b postop seroma with MRSA s/p I&D 06/13/19 and initiation of daptomycin twice with readmissions for trouble adhering to outpatient infusion therapy, who was admitted with MRSA bacteremia with negative repeat BCx.    24 Hour - Interval Events   None    Subjective   - Back and LE pain improved significantly, numbness and weakness unchanged  - No neck stiffness  - Has been ambulating well, no falls  - Drinks two cans of soda per day, and less than one pitcher of water    Physical Exam   Temp  Min: 97 F (36.1 C)  Max: 100.7 F (38.2 C)  Pulse  Min: 48  Max: 97  BP  Min: 104/42  Max: 130/58  Resp  Min: 17  Max: 18  SpO2  Min: 98 %  Max: 99 %    BP 130/58 (BP Location: Left arm, BP Patient Position: Semi-Fowlers)   Pulse 95   Temp 97.3 F (36.3 C)   Resp 18   Ht 5\' 6"  (1.676 m)   Wt 72.6 kg (160 lb)   SpO2 99%   BMI 25.82 kg/m  O2 Device: None (Room air)      08/30 0600 - 08/31 0559  In: 950 [P.O.:200; I.V.:750]  Out: -   Urine x 2 Stool x 1 Emesis x 0     GENERAL EXAM: NAD, well-developed, well-nourished  HEENT: no meningismus  SKIN: Erythema, edema, tenderness to palpation, and warmth over surgical incision in back now minimal, improved from prior. No purulent drainage, fluctuance, or dehiscence. Also has two areas of edema and induration on LUE at sites of former IV infiltration, less than prior. Tenderness and edema over RUE at site of IV from last night tracking along vein, although no induration or erythema.  CARDIAC: RRR. Normal S1, S2, no m/r/g  ABDOMEN: soft, nontender  EXTREMITIES: WWP, no peripheral edema    NEUROLOGIC EXAM:  Mental Status: Awake, fluent, conversant. Normal language output and comprehension    Motor  Normal bulk and tone  UPPER  EXT  Deltoids  Infrasinatus  Biceps  Triceps  Wrist extensors  Wrist flexors  FDI  ADM  APB  Finger extensors  FDP median  FDP unlar R/L  *  *  *  *  *  *  *  *  *  *  *  * LOWER EXT  Iliopsoas  Hip abductors  Hip adductors  Quadriceps  Hamstrings  Tibialis anterior  Gastroc   Foot eversion  Foot inversion  EHL R/L  3/4  *  *  *  *  *  *  *  *  *       * Not tested    Somatosensory  Patchy areas of numbness over RLE, worse distally and over thigh    Pertinent Labs and Imaging and Meds     WBC 7.5 (08/31) HGB 12.7* (08/31) PLT 214 (08/31)    HCT 38.8* (08/31)      Na 130* (08/31) CL 95* (08/31) BUN 14 (08/31) GLU   149* (08/31)   K 3.8 (08/31) CO2 23 (08/31) Cr 0.80 (08/31)      BCx 8/23: GP cocci in clusters, presence of MRSA DNA  BCx 8/24-8/28 NGTD  MRI thoracic and lumbar spine w/ and w/o contrast 07/04/19  1. Enhancement and incomplete fat suppression within the dorsal epidural fat from T6-T8, is concerning for infection/phlegmon within the epidural space; no visible epidural abscess within the limits of hardware artifact. Continued paraspinal inflammation and bilateral pleural effusions, without visible posterior mediastinal/pleural/retroperitoneal abscess. Dr. Dionisio Paschal was paged to alert him of findings at 1730 hours on 07/04/2019.    2. Persistent mild enhancement of the ventral cauda equina nerve roots as seen on prior MRI 06/21/2019.    3. Redemonstration of posterior decompression and instrumented fusion from T8-L1 with interval decrease in the size of a postoperative fluid collection in the midline subcutaneous tissues.    4. Multilevel degenerative changes without high-grade central canal stenosis in either the thoracic or lumbar spine, within the limitations of metallic artifact from the patient's spinal fusion hardware.    Scheduled Medications  . acetaminophen  975 mg Q8H   . enoxaparin  40 mg Daily   . polyethylene glycol  17 g Daily   . sodium chloride  10 mL Q8H   . sodium chloride  3 mL Q8H   .  vancomycin (VANCOCIN) IVPB  750 mg Q6H     Continuous Medications  . sodium chloride       PRN Medications  . heparin  100 Units Once PRN   . heparin  100 Units Once PRN   . lidocaine   Q4H PRN   . nalOXone  0.1 mg Q2 Min PRN   . oxyCODONE  10 mg Q4H PRN   . oxyCODONE  5 mg Q4H PRN   . oxyCODONE  5 mg Q4H PRN   . sodium chloride  10 mL PRN   . sodium chloride  3 mL PRN   . sodium chloride   Continuous PRN   . tiZANidine  4 mg Q6H PRN     Assessment / Plan   Brian Ritter is a30 year oldmale with hx ofankylosing spondylitis, Ehler's Danlos, autism, and recent spinal fusion surgery c/b postop seroma with MRSA s/p I&D 06/13/19 and initiation of daptomycin twice with readmissions for trouble adhering to outpatient infusion therapy, who was admitted with MRSA bacteremia with negative repeat BCx.    #MRSA T5-L3 seroma infection  Continuing IV abx for MRSA seroma infection from prior spinal surgery. Has failed to complete IV abx therapy as outpatient and also has hx of discomfort with PIV and has pulled out PICC, so prefer inpatient treatment. Presence of MRSA in blood cx on admission suggestive of bacteremia and hardware infection, but multiple negative repeat BCx suggests that infection cleared from blood. Today febrile to low 100s but symptomatically unchanged and WBC WNL. Will continue with treatment and monitor for resolution of fever.   - IV vancomycin x 6 weeks, per ID (IV abx restarted 8/24)  - Pain control: oxycodone 5-10mg  PO PRN, tylenol 975mg  q8h, topical lidocaine  - Place PICC today  - CM consulted for dispo plan    #IV site reaction  Erythema, edema, tenderness, and induration in cord-like pattern near sites of IV abx administration suggestive of thrombophlebitis. Unlikely to be contact dermatitis given distribution along blood vessel. Monitoring for signs of compartment syndrome or infection.   - Repeat coag panel  - Place PICC today  - If problem continues, consider using alternatives to medical  tape    #Hyponatremia  Low Na with low Cl suggestive of dilution, but Na did not change after administration of NS fluid bolus.  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 Mr. Jackovich denies polydipsia. Low concern for more malignant causes of SIADH (no meningismus or cranial nerve deficits, HIV negative 05/2019). No symptoms of hyponatremia, so will CTM.  - CTM with daily BMP    Discharge planning: placement and clinical improvement  Foley: No foley catheter  VTE prophylaxis: LMWH  Diet: Diet Regular  Last BM: 8/30  IV fluids: No IV fluids  Access: PIVs  Code status: Full Code    [name], MSIV  Patient care was discussed in detail with Talbert Forest*.

## 2019-07-11 NOTE — Procedures (Signed)
Brian Ritter is a 43 year old male patient.    ICD-10-CM ICD-9-CM   1. Surgical site infection  T81.49XA 998.59   2. Wound infection with MRSA  T14.8XXA 958.3    L08.9      No past medical history on file.  Blood pressure 132/63, pulse 98, temperature 98 F (36.7 C), resp. rate 18, height 5\' 6"  (1.676 m), weight 72.6 kg (160 lb), SpO2 99 %.    PICC Placement      Date/Time: 07/11/2019 12:00 PM      Universal Protocol  Consent: Verbal consent obtained. Written consent obtained.  Risks and benefits: risks, benefits and alternatives were discussed  Consent given by: patient  Patient understanding: patient states understanding of the procedure being performed  Patient consent: the patient's understanding of the procedure matches consent given  Procedure consent: procedure consent matches procedure scheduled  Relevant documents: relevant documents present and verified  Site marked: the operative site was marked  Imaging studies: imaging studies available  Patient identity confirmed: verbally with patient, arm band, provided demographic data and hospital-assigned identification number  Time out: Immediately prior to procedure a "time out" was called to verify the correct patient, procedure, equipment, support staff and site/side marked as required.      Indications and Staff  Indications: medications/fluids  Insertion location/unit: Armed forces training and education officer  Reason for insertion: new indication      Performed by: Conan Bowens, RN  Attending present: No    Procedure Details  Anesthesia: local infiltration  Local Anesthetic: lidocaine 1% without epinephrine  Anesthetic total: 5 mL  Patient was not sedated  Skin prep used?: chlorhexidine gluconate  Skin prep dry before first puncture: Yes      Patient position: supine  Insertion side: left   Insertion site: basilic    Catheter type: PICC  Tunneled: No   Catheter lumen: single lumen    Is this line for Dialysis: No  Brand (adult): Bard    Lot #: [id number]    Power  rated for IV contrast: Yes  Antimicrobial catheter used: No  Ultrasound guidance: yes  Sterile ultrasound technique: sterile gel and sterile probe covers were used.  Indications for ultrasound: safety      Ultrasound guided placement steps: vessel located, needle entry and guide wire removed  Sterile precautions used: sterile gown, cap, mask/eye shield, sterile gloves, head to toe drape on patient and hand hygiene  Sterile field maintained during procedure: Yes  Sterile technique maintained during procedure and dressing application: Yes  Catheter securement: non-suture securement device    Post Procedure  Post-procedure: dressing applied and antimicrobial patch applied  Estimated Blood Loss: 1 ml  Assessment: blood return through all ports and placement verified by [employer]  Complications: none    Follow up: none  Number of puncture attempts: 1  Line placement successful: Yes  Catheter tip location: SVC  Final length internal catheter: 39  Final length external catheter (cm): 1  Patient tolerance: patient tolerated the procedure well with no immediate complications      Comments: 34/1 L Basilic                           Conan Bowens, RN  07/11/2019

## 2019-07-12 ENCOUNTER — Inpatient Hospital Stay (HOSPITAL_COMMUNITY): Payer: Medicaid Other

## 2019-07-12 ENCOUNTER — Ambulatory Visit (HOSPITAL_BASED_OUTPATIENT_CLINIC_OR_DEPARTMENT_OTHER): Payer: Medicaid Other

## 2019-07-12 DIAGNOSIS — Z959 Presence of cardiac and vascular implant and graft, unspecified: Secondary | ICD-10-CM

## 2019-07-12 DIAGNOSIS — I82613 Acute embolism and thrombosis of superficial veins of upper extremity, bilateral: Secondary | ICD-10-CM

## 2019-07-12 LAB — BASIC METABOLIC PANEL, BLOOD
Anion Gap: 11 mmol/L (ref 7–15)
BUN: 12 mg/dL (ref 6–20)
Bicarbonate: 26 mmol/L (ref 22–29)
Calcium: 8.7 mg/dL (ref 8.5–10.6)
Chloride: 95 mmol/L — ABNORMAL LOW (ref 98–107)
Creatinine: 0.94 mg/dL (ref 0.67–1.17)
GFR: >60 mL/min
Glucose: 115 mg/dL — ABNORMAL HIGH (ref 70–99)
Potassium: 4 mmol/L (ref 3.5–5.1)
Sodium: 132 mmol/L — ABNORMAL LOW (ref 136–145)

## 2019-07-12 LAB — CBC WITH DIFF, BLOOD
ANC-Automated: 5.4 10*3/uL (ref 1.6–7.0)
Abs Basophils: 0 10*3/uL
Abs Eosinophils: 0.1 10*3/uL (ref 0.0–0.5)
Abs Lymphs: 1.7 10*3/uL (ref 0.8–3.1)
Abs Monos: 1.1 10*3/uL — ABNORMAL HIGH (ref 0.2–0.8)
Basophils: 0 %
Eosinophils: 1 %
Hct: 37 % — ABNORMAL LOW (ref 40.0–50.0)
Hgb: 12.1 gm/dL — ABNORMAL LOW (ref 13.7–17.5)
Imm Gran Abs: 0.1 10*3/uL (ref <0.1)
Lymphocytes: 21 %
MCH: 28.7 pg (ref 26.0–32.0)
MCHC: 32.7 g/dL (ref 32.0–36.0)
MCV: 87.7 um3 (ref 79.0–95.0)
MPV: 8.9 fL — ABNORMAL LOW (ref 9.4–12.4)
Monocytes: 13 %
Plt Count: 185 10*3/uL (ref 140–370)
RBC: 4.22 10*6/uL — ABNORMAL LOW (ref 4.60–6.10)
RDW: 14.5 % — ABNORMAL HIGH (ref 12.0–14.0)
Segs: 65 %
WBC: 8.3 10*3/uL (ref 4.0–10.0)

## 2019-07-12 LAB — BLOOD CULTURE: Blood Culture Result: NO GROWTH

## 2019-07-12 LAB — MAGNESIUM, BLOOD: Magnesium: 1.7 mg/dL (ref 1.6–2.6)

## 2019-07-12 LAB — PHOSPHORUS, BLOOD: Phosphorous: 3.6 mg/dL (ref 2.7–4.5)

## 2019-07-12 MED ORDER — DICLOFENAC SODIUM 1 % EX GEL
4.0000 g | Freq: Four times a day (QID) | TRANSDERMAL | Status: DC
Start: 2019-07-12 — End: 2019-07-25
  Administered 2019-07-13 – 2019-07-24 (×35): 4 g via TOPICAL
  Filled 2019-07-12 (×2): qty 100

## 2019-07-12 NOTE — Plan of Care (Signed)
Problem: Promotion of Health and Safety  Goal: Promotion of Health and Safety  Description: The patient remains safe, receives appropriate treatment and achieves optimal outcomes (physically, psychosocially, and spiritually) within the limitations of the disease process by discharge.    Information below is the current care plan.  Flowsheets  Taken 07/12/2019 0240 by Devonne Doughty, RN  Individualized Interventions/Recommendations #1: educate pt on risk for infection if he disconnects iv himself  Outcome Evaluation (rationale for progressing/not progressing) every shift: Pt A&OX4, VSS, pain treated with po oxy, picc line used for abx infusion  Taken 07/11/2019 1933 by Devonne Doughty, RN  Patient /Family stated Goal: resolve infection  Taken 07/11/2019 1200 by Oretha Ellis., RN  Individualized Interventions/Recommendations #2 (if applicable): Assess pain and evaluate effectiveness of the PRN  Individualized Interventions/Recommendations #3 (if applicable): Remind patient to call for assistance when feels weak or unsteady  Individualized Interventions/Recommendations #4 (if applicable): Apply safety precautions  Individualized Interventions/Recommendations #5 (if applicable): Monitor signs of infection

## 2019-07-12 NOTE — Progress Notes (Signed)
MEDICINE DAILY PROGRESS NOTE     Hospital day:   8 days - Admitted on: 07/03/2019    Brian Ritter is a43 year oldmale with hx ofankylosing spondylitis, Ehler's Danlos, autism, and recent spinal fusion surgery c/b postop seroma with MRSA s/p I&D 06/13/19 and initiation of daptomycin twice with readmissions for trouble adhering to outpatient infusion therapy, who was admitted with MRSA bacteremia with negative repeat BCx.    24 Hour - Interval Events   Worsening RUE pain, received US overnight    Subjective   - RUE pain and edema worsening at former PIV site  - Refused SNF because did not want to share room    Physical Exam   Temp  Min: 97.8 F (36.6 C)  Max: 99.4 F (37.4 C)  Pulse  Min: 80  Max: 103  BP  Min: 119/64  Max: 157/83  Resp  Min: 16  Max: 18  SpO2  Min: 96 %  Max: 98 %    BP 123/71 (BP Location: Right leg, BP Patient Position: Semi-Fowlers)   Pulse 100   Temp 99.4 F (37.4 C)   Resp 16   Ht 5\' 6"  (1.676 m)   Wt 72.6 kg (160 lb)   SpO2 98%   BMI 25.82 kg/m  O2 Device: None (Room air)      08/31 0600 - 09/01 0559  In: 1196 [P.O.:690; I.V.:506]  Out: -   Urine x 0 Stool x 1 Emesis x 0     GENERAL EXAM: NAD, well-developed, well-nourished  HEENT: No meningismus  SKIN:  Tenderness, induration, erythema, and edema over RUE at site of IV from last night tracking along vein. Also has two areas of edema and induration on LUE at sites of former IV infiltration, less than prior. Erythema, edema, tenderness to palpation, and warmth over surgical incision in back now minimal, improved from prior. No purulent drainage, fluctuance, or dehiscence.   CARDIAC: RRR. Normal S1, S2, no m/r/g  ABDOMEN: Soft, nontender  EXTREMITIES: WWP, no peripheral edema    NEUROLOGIC EXAM:  Mental Status: Awake, fluent, conversant. Normal language output and comprehension    Motor  Normal bulk and tone  UPPER EXT  Deltoids  Infrasinatus  Biceps  Triceps  Wrist extensors  Wrist flexors  FDI  ADM  APB  Finger extensors  FDP  median  FDP unlar R/L  *  *  *  *  *  *  *  *  *  *  *  * LOWER EXT  Iliopsoas  Hip abductors  Hip adductors  Quadriceps  Hamstrings  Tibialis anterior  Gastroc   Foot eversion  Foot inversion  EHL R/L  *  *  *  *  *  *  *  *  *  *       * Not tested    Somatosensory  Patchy areas of numbness over RLE unchanged from prior    Pertinent Labs and Imaging and Meds     WBC 8.3 (09/01) HGB 12.1* (09/01) PLT 185 (09/01)    HCT 37.0* (09/01)      Na 132* (09/01) CL 95* (09/01) BUN 12 (09/01) GLU   115* (09/01)   K 4.0 (09/01) CO2 26 (09/01) Cr 0.94 (09/01)      BCx 8/23: GP cocci in clusters, presence of MRSA DNA  BCx 8/24-8/28, 8/31 NGTD    Lab Results   Component Value Date    INR 1.1 07/11/2019  PTT 36 (H) 07/11/2019     MRI thoracic and lumbar spine w/ and w/o contrast 07/04/19  1. Enhancement and incomplete fat suppression within the dorsal epidural fat from T6-T8, is concerning for infection/phlegmon within the epidural space; no visible epidural abscess within the limits of hardware artifact. Continued paraspinal inflammation and bilateral pleural effusions, without visible posterior mediastinal/pleural/retroperitoneal abscess. Dr. Richard Miu was paged to alert him of findings at 1730 hours on 07/04/2019.    2. Persistent mild enhancement of the ventral cauda equina nerve roots as seen on prior MRI 06/21/2019.    3. Redemonstration of posterior decompression and instrumented fusion from T8-L1 with interval decrease in the size of a postoperative fluid collection in the midline subcutaneous tissues.    4. Multilevel degenerative changes without high-grade central canal stenosis in either the thoracic or lumbar spine, within the limitations of metallic artifact from the patient's spinal fusion hardware.    Scheduled Medications  . acetaminophen  975 mg Q8H   . enoxaparin  40 mg Daily   . polyethylene glycol  17 g Daily   . sodium chloride  10 mL Q8H   . sodium chloride  3 mL Q8H   . vancomycin (VANCOCIN) IVPB  750 mg  Q6H     Continuous Medications  . sodium chloride       PRN Medications  . heparin  100 Units Once PRN   . heparin  100 Units Once PRN   . lidocaine   Q4H PRN   . nalOXone  0.1 mg Q2 Min PRN   . oxyCODONE  10 mg Q4H PRN   . oxyCODONE  5 mg Q4H PRN   . oxyCODONE  5 mg Q4H PRN   . sodium chloride  10 mL PRN   . sodium chloride  3 mL PRN   . sodium chloride   Continuous PRN   . tiZANidine  4 mg Q6H PRN     Assessment / Plan   Brian Ritter is a43 year oldmale with hx ofankylosing spondylitis, Ehler's Danlos, autism, and recent spinal fusion surgery c/b postop seroma with MRSA s/p I&D 06/13/19 and initiation of daptomycin twice with readmissions for trouble adhering to outpatient infusion therapy, who was admitted with MRSA bacteremia with negative repeat BCx.    #MRSA T5-L3 seroma infection  Continuing IV abx for MRSA seroma infection from prior spinal surgery. Has failed to complete IV abx therapy as outpatient and also has hx of discomfort with PIV and has pulled out PICC, so prefer inpatient treatment. Presence of MRSA in blood cx on admission suggestive of bacteremia and hardware infection, but multiple negative repeat BCx suggests that infection cleared from blood. Today febrile to low 100s but symptomatically unchanged and WBC WNL. Will continue with treatment and monitor for resolution of fever. S/p PICC placement.  - IV vancomycin x 6 weeks, per ID (IV abx restarted 8/24)  - Pain control: oxycodone 5-10mg  PO PRN, tylenol 975mg  q8h, topical lidocaine  - CM consulted for dispo plan    #IV site reaction  Erythema, edema, tenderness, and induration in cord-like pattern near sites of IV abx administration suggestive of thrombophlebitis. Unlikely to be contact dermatitis given distribution along blood vessel. Monitoring for signs of compartment syndrome or infection. Coag panel unremarkable, so low concern for pro-thrombotic state.  - F/u US final read    #Hyponatremia  Low Na with low Cl suggestive of dilution,  but Na did not change after administration of NS fluid bolus. 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 Mr. Wanless denies polydipsia. Low concern for more malignant causes of SIADH (no meningismus or cranial nerve deficits, HIV negative 05/2019). No symptoms of hyponatremia, so will CTM.  - CTM with daily BMP    Discharge planning: placement and clinical improvement  Foley: No foley catheter  VTE prophylaxis: LMWH  Diet: Diet Regular  Last BM: 8/30  IV fluids: No IV fluids  Access: PIVs  Code status: Full Code    [name], MSIV  Patient care was discussed in detail with Hazle Coca*.

## 2019-07-12 NOTE — Plan of Care (Signed)
Problem: Promotion of Health and Safety  Goal: Promotion of Health and Safety  Description: The patient remains safe, receives appropriate treatment and achieves optimal outcomes (physically, psychosocially, and spiritually) within the limitations of the disease process by discharge.    Information below is the current care plan.  07/12/2019 1627 by Oretha Ellis., RN  Outcome: Progressing  Flowsheets  Taken 07/12/2019 1627  Individualized Interventions/Recommendations #1: Re emphasize on risk for infections if he disconnects IV.  Individualized Interventions/Recommendations #4 (if applicable): Monitor RUA swelling and redness  Outcome Evaluation (rationale for progressing/not progressing) every shift: V/S stable. c/o pain. Oxycodone given PRN. Still on IV Vancomycin. RUA swelling and redness, improving this afternoon. CM and SW on board. DC pending clinical improvement and placement.  Taken 07/12/2019 0800  Patient /Family stated Goal: " Find out how to treat my R arm , swollen now"  Taken 07/11/2019 1200  Individualized Interventions/Recommendations #2 (if applicable): Assess pain and evaluate effectiveness of the PRN  Individualized Interventions/Recommendations #3 (if applicable): Remind patient to call for assistance when feels weak or unsteady  Individualized Interventions/Recommendations #5 (if applicable): Monitor signs of infection  07/12/2019 1055 by Oretha Ellis., RN  Outcome: Progressing  Flowsheets (Taken 07/12/2019 0800)  Patient /Family stated Goal: " Find out how to treat my R arm , swollen now"

## 2019-07-13 ENCOUNTER — Ambulatory Visit (HOSPITAL_BASED_OUTPATIENT_CLINIC_OR_DEPARTMENT_OTHER): Payer: Medicaid Other

## 2019-07-13 LAB — BASIC METABOLIC PANEL, BLOOD
Anion Gap: 11 mmol/L (ref 7–15)
BUN: 13 mg/dL (ref 6–20)
Bicarbonate: 25 mmol/L (ref 22–29)
Calcium: 9 mg/dL (ref 8.5–10.6)
Chloride: 98 mmol/L (ref 98–107)
Creatinine: 1.02 mg/dL (ref 0.67–1.17)
GFR: >60 mL/min
Glucose: 103 mg/dL — ABNORMAL HIGH (ref 70–99)
Potassium: 4.2 mmol/L (ref 3.5–5.1)
Sodium: 134 mmol/L — ABNORMAL LOW (ref 136–145)

## 2019-07-13 LAB — CBC WITH DIFF, BLOOD
ANC-Automated: 4 10*3/uL (ref 1.6–7.0)
Abs Basophils: 0 10*3/uL
Abs Eosinophils: 0.2 10*3/uL (ref 0.0–0.5)
Abs Lymphs: 1.8 10*3/uL (ref 0.8–3.1)
Abs Monos: 0.9 10*3/uL — ABNORMAL HIGH (ref 0.2–0.8)
Basophils: 0 %
Eosinophils: 2 %
Hct: 37.7 % — ABNORMAL LOW (ref 40.0–50.0)
Hgb: 12.1 gm/dL — ABNORMAL LOW (ref 13.7–17.5)
Imm Gran %: 1 % (ref <1)
Imm Gran Abs: 0.1 10*3/uL (ref <0.1)
Lymphocytes: 26 %
MCH: 28.3 pg (ref 26.0–32.0)
MCHC: 32.1 g/dL (ref 32.0–36.0)
MCV: 88.3 um3 (ref 79.0–95.0)
MPV: 9.9 fL (ref 9.4–12.4)
Monocytes: 13 %
Plt Count: 187 10*3/uL (ref 140–370)
RBC: 4.27 10*6/uL — ABNORMAL LOW (ref 4.60–6.10)
RDW: 14.6 % — ABNORMAL HIGH (ref 12.0–14.0)
Segs: 57 %
WBC: 7 10*3/uL (ref 4.0–10.0)

## 2019-07-13 LAB — BLOOD CULTURE: Blood Culture Result: NO GROWTH

## 2019-07-13 LAB — PHOSPHORUS, BLOOD: Phosphorous: 3.3 mg/dL (ref 2.7–4.5)

## 2019-07-13 LAB — MAGNESIUM, BLOOD: Magnesium: 2 mg/dL (ref 1.6–2.6)

## 2019-07-13 MED ORDER — OXYCODONE HCL 5 MG OR TABS
5.0000 mg | ORAL_TABLET | ORAL | Status: DC | PRN
Start: 2019-07-13 — End: 2019-07-14
  Administered 2019-07-13 – 2019-07-14 (×5): 5 mg via ORAL
  Filled 2019-07-13 (×5): qty 1

## 2019-07-13 NOTE — Plan of Care (Signed)
Problem: Promotion of Health and Safety  Goal: Promotion of Health and Safety  Description: The patient remains safe, receives appropriate treatment and achieves optimal outcomes (physically, psychosocially, and spiritually) within the limitations of the disease process by discharge.    Information below is the current care plan.  Outcome: Progressing  Flowsheets  Taken 07/12/2019 2004 by Fransico Him, RN  Patient /Family stated Goal: pain control  Taken 07/12/2019 1627 by Oretha Ellis., RN  Individualized Interventions/Recommendations #1: Re emphasize on risk for infections if he disconnects IV.  Individualized Interventions/Recommendations #4 (if applicable): Monitor RUA swelling and redness  Outcome Evaluation (rationale for progressing/not progressing) every shift: V/S stable. c/o pain. Oxycodone given PRN. Still on IV Vancomycin. RUA swollen and ed, improving this afternoon.  Taken 07/11/2019 1200 by Oretha Ellis., RN  Individualized Interventions/Recommendations #2 (if applicable): Assess pain and evaluate effectiveness of the PRN  Individualized Interventions/Recommendations #3 (if applicable): Remind patient to call for assistance when feels weak or unsteady  Taken 07/09/2019 0345 by Alda Berthold, RN  Guidelines: Inpatient Nursing Guidelines

## 2019-07-13 NOTE — Interdisciplinary (Addendum)
Late note-DC planning update:-    Spoke with Jenny CM at [facility] [phone].  She would be willing to do a letter of agreement for SNF if patient agrees and if there is an accepting SNF.  Spoke with patient yesterday and told him about a potential admission to a SNF. Patient was agreeable and stated OK.  Told her that the [facility] is interested but would need a LOA. Also notified Deloria Lair [phone] the [facility].  Will co-ordinate getting LOA for admission at the [facility].    11:30- spoke with CM Sonia Baller at [facility] who said she left a message with [facility] to call her back to discuss LOA and costs and rates etc.....  She also said that this has to go to her medical director to get approval.

## 2019-07-13 NOTE — Interdisciplinary (Signed)
Physical Therapy Evaluation and Discharge    Admitting Physician:  Hazle Coca*  Admission Date 07/03/2019    Inpatient Diagnosis:   Problem List       Codes    Surgical site infection    -  Primary ICD-10-CM: T81.49XA  ICD-9-CM: 998.59          IP Start of Service   Start of Care: 07/13/19  Reason for referral: Decline in functional ability/mobility    Preferred Language:English         No past medical history on file.   No past surgical history on file.    PT Acute     Row Name 07/13/19 1600          Type of Visit    Type of Physical Therapy note  Physical Therapy Evaluation and Discharge     Row Name 07/13/19 1600          Treatment Precautions/Restrictions    Precautions/Restrictions  None     Row Name 07/13/19 1600          Medical History    History of presenting condition  Brian Ritter is a 43 year old male with hx of ankylosing spondylitis, Ehler's Danlos, autism, and recent spinal fusion surgery c/b postop seroma with MRSA s/p I&D 06/13/19 and initiation of daptomycin twice with readmissions for trouble adhering to outpatient infusion therapy, who was admitted with MRSA bacteremia with negative repeat BCx.     Fall history  No falls reported in the last 6 months     Row Name 07/13/19 1600          Functional History    Prior Level of Function  No deficits     Equipment required for mobility in the home  None     Row Name 07/13/19 1600          Social History    Living Situation  Lives with friend/roommate     [place];Other (Comment) lives in [place] accessibility   Stairs present     Other Social History Information  lives with partner in [place] Name 07/13/19 1600          Subjective    Subjective Information  "I'm not a nice person in the morning"     Patient status  Patient agreeable to treatment;Nursing in agreement for treatment     [place] Name 07/13/19 1600          Pain Assessment    Pain Asssessment Tool  Numeric Pain Rating Scale     Row Name  07/13/19 1600          Numeric Pain Rating Scale    Pain Intensity - rating at present  0     Pain Intensity- rating after treatment  0     Location  no c/o pain     Row Name 07/13/19 1600          Objective    Overall Cognitive Status  Intact - no cognitive limitations or impairments noted     Communication  No communication limitations or impairments noted. Current status of hearing, speech and vision allow functional communication.     Coordination/Motor control  No limitations or impairments noted. Movement patterns are fluid and coordinated throughout     Balance  No balance limitations or impairments noted. Patient is able to maintain balance during mobility skills and daily activities.  Extremity Assessment  Range of motion, strength,  muscle tone and/or sensation limitations present     Other  Extremity Assessment  Information  Pt noted to have limited LE hip flexion in sitting EOB. Pt with functional B LE ROM      Functional Mobility  No limitations or impairments in functional mobility noted. Patient independent in mobility activities of daily living     Other Objective Findings  Pt performed bed mob IND, transferred to EOB. Performed sit to stand and ambulation in room IND. transfers IND to toilet and able to make sudden changes in direction and avoid obstacles withoust assistance.Pt declined ambulating outside of room. Educated on increasing activity while admitted to maintain current level of IND. Pt returned to laying in bed and closed eyes, no longer responding to PT. As noted in other care provider interactions with pt this has been normal for him.  Pt left in bed, all needs in reach and RN aware of pt status.                Eval cont.     Row Name 07/13/19 1600          [place] AM-PAC: Basic Mobility    Assistance Needed to Turn from Back to Side While in a Flat Bed Without Using Bedrails  4 - None (independent)     Difficulty with Supine to Sit Transfer  4 - None (independent)     How Much Help  Needed to Move to/from Bed to Chair  4 - None (independent)     Difficulty with Sit to Stand Transfer from Chair with Arms  4 - None (independent)     How Much Help Needed to Walk in Room  4 - None (independent)     How Much Help Needed to Climb 3-5 Steps with a Rail  4 - None (independent)     AMPAC Total Score  24     Assessment: AM-PAC Basic Mobility Impairment Rating  Score 24 - 0% impaired     Row Name 07/13/19 1600          Patient/Family Education    Learner(s)  Patient     Learner response to rehab patient education interventions  No evidence of learning     Patient/family training comments  role of PT, safety in mobility.      Row Name 07/13/19 1600          Assessment    Assessment  Pt presents at PLOF, able to ambulate in room IND, without AD. Pt safe to DC from skilled PT intervention. Pt without further skilled PT needs at this time. Safe to ambulate IND in room.      Rehab Potential  Good     Row Name 07/13/19 1600          Treatment Plan Disussion    Treatment Plan Discussion and Agreement  Patient support system determined and all questions were asked and answered;Patient/family/caregiver stated understanding and agreement with the therapy plan     Row Name 07/13/19 1600          Treatment Plan    Frequency of treatment  Patient appropriate for discharge from therapy     Duration of treatment (number of visits)  One time only, further treatment not indicated     Status of treatment  Patient appropriate for discharge from therapy     Row Name 07/13/19 1600          Patient  Safety Considerations    Patient safety considerations  Patient returned to bed at end of treatment;Call light left in reach and fall precautions in place     Patient assistive device requirements for safe ambulation  No device required     Row Name 07/13/19 1600          Therapy Plan Communication    Therapy Plan Communication  Discussed therapy plan and patient's mobility status with Case Manager;Discussed therapy plan with  Nursing and/or Physician     Row Name 07/13/19 1600          Physical Therapy Patient Discharge Instructions    Your Physical Therapist suggests the following  Continue to complete your home exercise program daily as instructed     Row Name 07/13/19 1500          MediCal Evaluation    Medi-Cal Eval Initial 30 minutes (3920)  Completed     Medi-Cal eval 15 min addtl ([id number])            1     Row Name 07/13/19 1500          MediCAL Treatment today    Gait Training   Dynamic activities while walking;Assistive device training;Weight shift and postural control activities during gail       Gait Training   see objective     Neuromuscular re-education   anticipatory postural adjustment training;Balance activities to improve control of center of gravity over base of support       Neuromuscular re-education    see object     Therapeutic Activities    Assistance/facilitation of bed mobility training;Dynamic activities to improve performance of functional tasks/activities       Therapeutic activities   see objective     Row Name 07/13/19 1600          Treatment Time     Treatment start time  0915     Total TIMED Treatment  (min)  45     Total Treatment Time (min)  45         Post Acute Discharge Recommendations  Discharge Rehabilitation Reccomendations ([employer] ONLY): None- patient currently  has no further skilled therapy needs  Equipment recommendations: No equipment needed - patient has own equipment    The physical therapist of record is endorsed by evaluating physical therapist.

## 2019-07-13 NOTE — Plan of Care (Signed)
Problem: Promotion of Health and Safety  Goal: Promotion of Health and Safety  Description: The patient remains safe, receives appropriate treatment and achieves optimal outcomes (physically, psychosocially, and spiritually) within the limitations of the disease process by discharge.    Information below is the current care plan.  Outcome: Progressing  Flowsheets  Taken 07/13/2019 1119 by Joanna Puff, RN  Guidelines: Inpatient Nursing Guidelines  Individualized Interventions/Recommendations #4 (if applicable): Applied Voltaren cream to RUE for swelling and redness.  Outcome Evaluation (rationale for progressing/not progressing) every shift: VSS. No signs of acute distress. RUE red and swollen. Will monitor for s/sx of infection. Pt receiveing ABX. Will continue to monitor.  Taken 07/12/2019 2004 by Fransico Him, RN  Patient /Family stated Goal: pain control  Taken 07/12/2019 1627 by Oretha Ellis., RN  Individualized Interventions/Recommendations #1: Re emphasize on risk for infections if he disconnects IV.  Taken 07/11/2019 1200 by Oretha Ellis., RN  Individualized Interventions/Recommendations #2 (if applicable): Assess pain and evaluate effectiveness of the PRN  Individualized Interventions/Recommendations #3 (if applicable): Remind patient to call for assistance when feels weak or unsteady  Individualized Interventions/Recommendations #5 (if applicable): Monitor signs of infection

## 2019-07-13 NOTE — Progress Notes (Signed)
MEDICINE DAILY PROGRESS NOTE     Hospital day:   9 days - Admitted on: 07/03/2019    Brian Ritter is a43 year oldmale with hx ofankylosing spondylitis, Ehler's Danlos, autism, and recent spinal fusion surgery c/b postop seroma with MRSA s/p I&D 06/13/19 and initiation of daptomycin twice with readmissions for trouble adhering to outpatient infusion therapy, who was admitted with MRSA bacteremia with negative repeat BCx.    24 Hour - Interval Events   None    Subjective   - RUE pain and edema improved from prior PIV site    Physical Exam   Temp  Min: 94.5 F (34.7 C)  Max: 99 F (37.2 C)  Pulse  Min: 80  Max: 106  BP  Min: 127/61  Max: 151/76  Resp  Min: 16  Max: 18  SpO2  Min: 98 %  Max: 99 %    BP 149/90 (BP Location: Left leg, BP Patient Position: Semi-Fowlers)   Pulse 106   Temp 99 F (37.2 C)   Resp 18   Ht 5\' 6"  (1.676 m)   Wt 72.6 kg (160 lb)   SpO2 98%   BMI 25.82 kg/m  O2 Device: None (Room air)      09/01 0600 - 09/02 0559  In: 1860 [P.O.:840; I.V.:1020]  Out: -   Urine x 4 Stool x 1 Emesis x 0     GENERAL EXAM: NAD, well-developed, well-nourished  SKIN:  Tenderness, induration, erythema, and edema over RUE at site of IV from last night tracking along vein. Also has two areas of edema and induration on LUE at sites of former IV infiltration, less than prior. Erythema, edema, tenderness to palpation, and warmth over surgical incision in back now minimal, improved from prior. No purulent drainage, fluctuance, or dehiscence.   CARDIAC: RRR. Normal S1, S2, no m/r/g  EXTREMITIES: WWP, no peripheral edema  NEUROLOGIC EXAM: Awake, fluent, conversant. Normal language output and comprehension. Moving all 4 extremities spontaneously    Pertinent Labs and Imaging and Meds     WBC 7.0 (09/02) HGB 12.1* (09/02) PLT 187 (09/02)    HCT 37.7* (09/02)      Na 134* (09/02) CL 98 (09/02) BUN 13 (09/02) GLU   103* (09/02)   K 4.2 (09/02) CO2 25 (09/02) Cr 1.02 (09/02)      BCx 8/23: GP cocci in clusters, presence  of MRSA DNA  BCx 8/24-8/28, 8/31 NGTD    Lab Results   Component Value Date    INR 1.1 07/11/2019    PTT 36 (H) 07/11/2019     US upper extremities:  1. Occlusive superficial thrombus within the bilateral cephalic veins.  2. No evidence of acute deep vein thrombosis within either upper extremity.    MRI thoracic and lumbar spine w/ and w/o contrast 07/04/19  1. Enhancement and incomplete fat suppression within the dorsal epidural fat from T6-T8, is concerning for infection/phlegmon within the epidural space; no visible epidural abscess within the limits of hardware artifact. Continued paraspinal inflammation and bilateral pleural effusions, without visible posterior mediastinal/pleural/retroperitoneal abscess. Dr. Richard MiuSeymann was paged to alert him of findings at 1730 hours on 07/04/2019.    2. Persistent mild enhancement of the ventral cauda equina nerve roots as seen on prior MRI 06/21/2019.    3. Redemonstration of posterior decompression and instrumented fusion from T8-L1 with interval decrease in the size of a postoperative fluid collection in the midline subcutaneous tissues.    4. Multilevel degenerative changes without  high-grade central canal stenosis in either the thoracic or lumbar spine, within the limitations of metallic artifact from the patient's spinal fusion hardware.    Scheduled Medications  . acetaminophen  975 mg Q8H   . diclofenac  4 g 4x Daily   . enoxaparin  40 mg Daily   . polyethylene glycol  17 g Daily   . sodium chloride  10 mL Q8H   . sodium chloride  3 mL Q8H   . vancomycin (VANCOCIN) IVPB  750 mg Q6H     Continuous Medications  . sodium chloride       PRN Medications  . heparin  100 Units Once PRN   . heparin  100 Units Once PRN   . lidocaine   Q4H PRN   . nalOXone  0.1 mg Q2 Min PRN   . oxyCODONE  5 mg Q4H PRN   . sodium chloride  10 mL PRN   . sodium chloride  3 mL PRN   . sodium chloride   Continuous PRN   . tiZANidine  4 mg Q6H PRN     Assessment / Plan   Brian Ritter is a43 year  oldmale with hx ofankylosing spondylitis, Ehler's Danlos, autism, and recent spinal fusion surgery c/b postop seroma with MRSA s/p I&D 06/13/19 and initiation of daptomycin twice with readmissions for trouble adhering to outpatient infusion therapy, who was admitted with MRSA bacteremia with negative repeat BCx.    #MRSA T5-L3 seroma infection  Continuing IV abx for MRSA seroma infection from prior spinal surgery. Has failed to complete IV abx therapy as outpatient and also has hx of discomfort with PIV and has pulled out PICC, so prefer inpatient treatment. Presence of MRSA in blood cx on admission suggestive of bacteremia and hardware infection, but multiple negative repeat BCx suggests that infection cleared from blood. Today febrile to low 100s but symptomatically unchanged and WBC WNL. Will continue with treatment and monitor for resolution of fever. S/p PICC placement.  - IV vancomycin x 6 weeks, per ID (IV abx restarted 8/24)  - Pain control: decreasing to oxycodone 5mg  PO PRN, tylenol 975mg  q8h, topical lidocaine  - CM working on dispo plan    #IV site reaction  Erythema, edema, tenderness, and induration in cord-like pattern near sites of IV abx administration suggestive of thrombophlebitis. US shows bilateral cephalic vein superficial thromboses. Unlikely to be contact dermatitis given distribution along blood vessel. Monitoring for signs of compartment syndrome or infection. Coag panel unremarkable, so low concern for pro-thrombotic state.  - Warm compress  - Diclofenac cream    #Hyponatremia  Low Na with low Cl suggestive of dilution, but Na did not change after administration of NS fluid bolus. 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 Brian Ritter denies polydipsia. Low concern for more malignant causes of SIADH (no meningismus or cranial nerve deficits, HIV negative 05/2019). No symptoms of hyponatremia, so will CTM.  - CTM with daily BMP     Discharge planning: placement and clinical  improvement  Foley: No foley catheter  VTE prophylaxis: LMWH  Diet: Diet Regular  Last BM: 8/30  IV fluids: No IV fluids  Access: PIVs  Code status: Full Code    [name], MSIV  Patient care was discussed in detail with Hazle Coca*.

## 2019-07-14 ENCOUNTER — Ambulatory Visit (HOSPITAL_BASED_OUTPATIENT_CLINIC_OR_DEPARTMENT_OTHER): Payer: Medicaid Other

## 2019-07-14 LAB — CBC WITH DIFF, BLOOD
ANC-Automated: 4.6 10*3/uL (ref 1.6–7.0)
Abs Basophils: 0 10*3/uL
Abs Eosinophils: 0.2 10*3/uL (ref 0.0–0.5)
Abs Lymphs: 1.7 10*3/uL (ref 0.8–3.1)
Abs Monos: 0.8 10*3/uL (ref 0.2–0.8)
Basophils: 1 %
Eosinophils: 2 %
Hct: 37.8 % — ABNORMAL LOW (ref 40.0–50.0)
Hgb: 12.2 gm/dL — ABNORMAL LOW (ref 13.7–17.5)
Imm Gran %: 1 % (ref <1)
Imm Gran Abs: 0.1 10*3/uL (ref <0.1)
Lymphocytes: 23 %
MCH: 28.6 pg (ref 26.0–32.0)
MCHC: 32.3 g/dL (ref 32.0–36.0)
MCV: 88.5 um3 (ref 79.0–95.0)
MPV: 10.2 fL (ref 9.4–12.4)
Monocytes: 11 %
Plt Count: 214 10*3/uL (ref 140–370)
RBC: 4.27 10*6/uL — ABNORMAL LOW (ref 4.60–6.10)
RDW: 14.5 % — ABNORMAL HIGH (ref 12.0–14.0)
Segs: 62 %
WBC: 7.4 10*3/uL (ref 4.0–10.0)

## 2019-07-14 LAB — BASIC METABOLIC PANEL, BLOOD
Anion Gap: 12 mmol/L (ref 7–15)
BUN: 15 mg/dL (ref 6–20)
Bicarbonate: 26 mmol/L (ref 22–29)
Calcium: 9 mg/dL (ref 8.5–10.6)
Chloride: 96 mmol/L — ABNORMAL LOW (ref 98–107)
Creatinine: 0.85 mg/dL (ref 0.67–1.17)
GFR: >60 mL/min
Glucose: 118 mg/dL — ABNORMAL HIGH (ref 70–99)
Potassium: 4.1 mmol/L (ref 3.5–5.1)
Sodium: 134 mmol/L — ABNORMAL LOW (ref 136–145)

## 2019-07-14 LAB — VANCOMYCIN, TROUGH: Vancomycin, Trough: 17.8 ug/mL (ref 10.0–20.0)

## 2019-07-14 MED ORDER — IBUPROFEN 400 MG OR TABS
800.0000 mg | ORAL_TABLET | Freq: Four times a day (QID) | ORAL | Status: DC | PRN
Start: 2019-07-14 — End: 2019-07-25
  Filled 2019-07-14: qty 2

## 2019-07-14 MED ORDER — OXYCODONE HCL 5 MG OR TABS
5.0000 mg | ORAL_TABLET | ORAL | Status: DC | PRN
Start: 2019-07-14 — End: 2019-07-17
  Administered 2019-07-14 – 2019-07-17 (×16): 5 mg via ORAL
  Filled 2019-07-14 (×16): qty 1

## 2019-07-14 NOTE — Progress Notes (Signed)
[facility] day:   10 days - Admitted on: 07/03/2019    Wanda Cellucci is a79 year oldmale with hx ofankylosing spondylitis, Ehler's Danlos, autism, and recent spinal fusion surgery c/b postop seroma with MRSA s/p I&D 06/13/19 and initiation of daptomycin twice with readmissions for trouble adhering to outpatient infusion therapy, who was admitted with MRSA bacteremia with negative repeat BCx.    24 Hour - Interval Events   None    Subjective   - Angry about SNF placement and does not want to discuss going to another facility  - RUE pain and edema improved from prior PIV site    Physical Exam   Temp  Min: 98.3 F (36.8 C)  Max: 99 F (37.2 C)  Pulse  Min: 81  Max: 106  BP  Min: 124/74  Max: 149/90  Resp  Min: 16  Max: 19  SpO2  Min: 95 %  Max: 98 %    BP 131/68 (BP Location: Right leg, BP Patient Position: Semi-Fowlers)   Pulse 83   Temp 98.3 F (36.8 C)   Resp 16   Ht 5\' 6"  (1.676 m)   Wt 72.6 kg (160 lb)   SpO2 95%   BMI 25.82 kg/m  O2 Device: None (Room air)      09/02 0600 - 09/03 0559  In: 860 [P.O.:590; I.V.:270]  Out: -   Urine x 0 Stool x 1 Emesis x 0     GENERAL EXAM: NAD, well-developed, well-nourished  SKIN:  Tenderness, induration, erythema, and edema over RUE at site of IV from last night tracking along vein. Also has two areas of edema and induration on LUE at sites of former IV infiltration, less than prior. Erythema, edema, tenderness to palpation, and warmth over surgical incision in back now minimal, improved from prior. No purulent drainage, fluctuance, or dehiscence.   CARDIAC: RRR. Normal S1, S2, no m/r/g  EXTREMITIES: WWP, no peripheral edema  NEUROLOGIC EXAM: Sleepy but arousable, fluent, conversant. Normal language output and comprehension. Moving all 4 extremities spontaneously    Pertinent Labs and Imaging and Meds     WBC 7.4 (09/03) HGB 12.2* (09/03) PLT 214 (09/03)    HCT 37.8* (09/03)      Na 134* (09/03) CL 96* (09/03) BUN 15 (09/03) GLU   118*  (09/03)   K 4.1 (09/03) CO2 26 (09/03) Cr 0.85 (09/03)      BCx 8/23: GP cocci in clusters, presence of MRSA DNA  BCx 8/24-8/28, 8/31 NGTD    [place] upper extremities:  1. Occlusive superficial thrombus within the bilateral cephalic veins.  2. No evidence of acute deep vein thrombosis within either upper extremity.    MRI thoracic and lumbar spine w/ and w/o contrast 07/04/19  1. Enhancement and incomplete fat suppression within the dorsal epidural fat from T6-T8, is concerning for infection/phlegmon within the epidural space; no visible epidural abscess within the limits of hardware artifact. Continued paraspinal inflammation and bilateral pleural effusions, without visible posterior mediastinal/pleural/retroperitoneal abscess. Dr. Dionisio Paschal was paged to alert him of findings at 1730 hours on 07/04/2019.    2. Persistent mild enhancement of the ventral cauda equina nerve roots as seen on prior MRI 06/21/2019.    3. Redemonstration of posterior decompression and instrumented fusion from T8-L1 with interval decrease in the size of a postoperative fluid collection in the midline subcutaneous tissues.    4. Multilevel degenerative changes without high-grade central canal stenosis in either  the thoracic or lumbar spine, within the limitations of metallic artifact from the patient's spinal fusion hardware.    Scheduled Medications  . diclofenac  4 g 4x Daily   . enoxaparin  40 mg Daily   . polyethylene glycol  17 g Daily   . sodium chloride  10 mL Q8H   . sodium chloride  3 mL Q8H   . vancomycin (VANCOCIN) IVPB  750 mg Q6H     Continuous Medications  . sodium chloride       PRN Medications  . heparin  100 Units Once PRN   . heparin  100 Units Once PRN   . ibuprofen  800 mg Q6H PRN   . lidocaine   Q4H PRN   . nalOXone  0.1 mg Q2 Min PRN   . oxyCODONE  5 mg Q4H PRN   . sodium chloride  10 mL PRN   . sodium chloride  3 mL PRN   . sodium chloride   Continuous PRN   . tiZANidine  4 mg Q6H PRN     Assessment / Plan   Drema Dallasravis Bachtel  is a43 year oldmale with hx ofankylosing spondylitis, Ehler's Danlos, autism, and recent spinal fusion surgery c/b postop seroma with MRSA s/p I&D 06/13/19 and initiation of daptomycin twice with readmissions for trouble adhering to outpatient infusion therapy, who was admitted with MRSA bacteremia with negative repeat BCx.    #MRSA T5-L3 seroma infection  Continuing IV abx for MRSA seroma infection from prior spinal surgery. Presence of MRSA in blood cx on admission suggestive of bacteremia and hardware infection, but multiple negative repeat BCx suggests that infection cleared from blood.  Has failed to complete IV abx therapy as outpatient and also has hx of discomfort with PIV and has pulled out PICC, so prefer inpatient treatment. S/p PICC placement. CM coordinating possible [facility], and will f/u if patient amenable.  - IV vancomycin x 6 weeks, per ID (IV abx restarted 8/24)  - Pain control: oxycodone 5mg  PO PRN, tylenol 975mg  q8h, topical lidocaine and diclofenac    #Superficial thrombophlebitis  Improving. Erythema, edema, tenderness, and induration in cord-like pattern near sites of IV abx administration consistent with thrombophlebitis, and US shows bilateral cephalic vein superficial thromboses. Monitoring for signs of compartment syndrome or infection. Coag panel unremarkable, so low concern for pro-thrombotic state.  - Warm compress  - Diclofenac cream    #Hyponatremia  Low Na with low Cl suggestive of dilution, but Na did not change after administration of NS fluid bolus. 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 Mr. Loletha CarrowHodgdon denies polydipsia. Low concern for more malignant causes of SIADH (no meningismus or cranial nerve deficits, HIV negative 05/2019). No symptoms of hyponatremia, so will CTM.  - CTM with daily BMP    Discharge planning: placement and clinical improvement  Foley: No foley catheter  VTE prophylaxis: LMWH  Diet: Diet Regular  Last BM: 8/30  IV fluids: No  IV fluids  Access: PIVs  Code status: Full Code    Brayton LaymanSonya Smiley Birr, MSIV  Patient care was discussed in detail with Talbert ForestMcIntyre, Jonathan Stewa*.

## 2019-07-14 NOTE — Plan of Care (Signed)
Problem: Promotion of Health and Safety  Goal: Promotion of Health and Safety  Description: The patient remains safe, receives appropriate treatment and achieves optimal outcomes (physically, psychosocially, and spiritually) within the limitations of the disease process by discharge.    Information below is the current care plan.  Outcome: Progressing  Flowsheets  Taken 07/14/2019 1427 by Antony Odea, RN  Guidelines: Inpatient Nursing Guidelines  Individualized Interventions/Recommendations #1: cluster care to help promote rest  Individualized Interventions/Recommendations #2 (if applicable): keep door closed to help decrease stimulation  Outcome Evaluation (rationale for progressing/not progressing) every shift: VSS, PRN pain meds given with adequate relief, pt compliant with POC, tolerating IV abx, resting comfortably in bed, will continue to monitor.  Taken 07/14/2019 0815 by Antony Odea, RN  Patient /Family stated Goal: I want my pain meds  Taken 07/11/2019 1200 by Oretha Ellis., RN  Individualized Interventions/Recommendations #3 (if applicable): Remind patient to call for assistance when feels weak or unsteady

## 2019-07-14 NOTE — Progress Notes (Signed)
Pharmacokinetics Note - Vancomycin  Vancomycin Indication: MRSA sarcoma (Goal: Trough 15 - 20 mg/L), started 8/28  Vancomycin level: 17.8 mg/L on 07/14/2019 at 0530, trough, at steady state  Kidney Function: SCr: 0.85 mg/dL, at baseline.  Culture results: 07/03/2019 BCx - MRSA (MIC = 1 mcg/mL)    Assessment / Plan:    Pharmacokinetic Parameters: Volume of distribution: 48 L, Clearance: 5 L/h, Half-life: 6.7 h   Current regimen of 750 mg Q6H gives an estimated trough at steady state of 16 and an AUC of 611, which is therapeutic.    Will continue current dose. Monitor kidney function and plan trough level on 07/18/19 @0530  or sooner if clinically indicated.    Pharmacist will continue to monitor and make adjustments as needed.    Wyline Copas, PharmD  PGY1 Pharmacy Resident  Pager: [phone]

## 2019-07-14 NOTE — Interdisciplinary (Signed)
07/14/19 1552   Follow Up/Progress   Is the Patient Ready for Discharge * Yes   Barriers to Discharge * Other (Comment)  (Patient refusing to go to a SNF.)   Patient/Family/Legal/Surrogate Decision Maker Has Been Given a List Options And Choice In The Selection of Post-Acute Care Providers * Yes   Patient/Family/Other Are In Agreement With Discharge Plan * No       .07/14/19  3:53 PM    Medical Intervention(s) requiring continued Hospital Stay:  MRSA T5-L3 seroma infection-IV Vancomycin q6 until 08/15/19 via PICC line.    Anticipated dispo plan  Lives in a [place] with his partner. Has failed 2 prior attempts at out-patient infusion center [facility] due to no show.  Has been accepted at [facility]. They have received the LOA from [employer]. However, patient is refusing to go to a SNF.  Per Sonia Baller [phone] at [facility], they are not able to force the patient or to bill him for refusing to go to a SNF because this is a Medi[employer] plan.        Barriers to Discharge:  Patient refusal to go to SNF.      Delman Cheadle, RN  Care Manager

## 2019-07-14 NOTE — Plan of Care (Signed)
Problem: Promotion of Health and Safety  Goal: Promotion of Health and Safety  Description: The patient remains safe, receives appropriate treatment and achieves optimal outcomes (physically, psychosocially, and spiritually) within the limitations of the disease process by discharge.    Information below is the current care plan.  Outcome: Progressing  Flowsheets  Taken 07/13/2019 2112 by Fransico Him, RN  Patient /Family stated Goal: pain control  Taken 07/13/2019 1119 by Joanna Puff, RN  Guidelines: Inpatient Nursing Guidelines  Individualized Interventions/Recommendations #4 (if applicable): Applied Voltaren cream to RUE for swelling and redness.  Outcome Evaluation (rationale for progressing/not progressing) every shift: VSS. No signs of acute distress. RUE red and swollen. Will monitor for s/sx of infection. Pt receiveing ABX. Will continue to monitor.  Taken 07/12/2019 1627 by Oretha Ellis., RN  Individualized Interventions/Recommendations #1: Re emphasize on risk for infections if he disconnects IV.  Taken 07/11/2019 1200 by Oretha Ellis., RN  Individualized Interventions/Recommendations #2 (if applicable): Assess pain and evaluate effectiveness of the PRN  Individualized Interventions/Recommendations #3 (if applicable): Remind patient to call for assistance when feels weak or unsteady  Individualized Interventions/Recommendations #5 (if applicable): Monitor signs of infection

## 2019-07-15 ENCOUNTER — Ambulatory Visit (HOSPITAL_BASED_OUTPATIENT_CLINIC_OR_DEPARTMENT_OTHER): Payer: Medicaid Other

## 2019-07-15 LAB — CBC WITH DIFF, BLOOD
ANC-Automated: 4.5 10*3/uL (ref 1.6–7.0)
Abs Basophils: 0 10*3/uL
Abs Eosinophils: 0.2 10*3/uL (ref 0.0–0.5)
Abs Lymphs: 1.9 10*3/uL (ref 0.8–3.1)
Abs Monos: 0.9 10*3/uL — ABNORMAL HIGH (ref 0.2–0.8)
Basophils: 0 %
Eosinophils: 2 %
Hct: 37.7 % — ABNORMAL LOW (ref 40.0–50.0)
Hgb: 12.1 gm/dL — ABNORMAL LOW (ref 13.7–17.5)
Imm Gran %: 1 % (ref <1)
Imm Gran Abs: 0.1 10*3/uL (ref <0.1)
Lymphocytes: 25 %
MCH: 28.4 pg (ref 26.0–32.0)
MCHC: 32.1 g/dL (ref 32.0–36.0)
MCV: 88.5 um3 (ref 79.0–95.0)
MPV: 9.7 fL (ref 9.4–12.4)
Monocytes: 12 %
Plt Count: 220 10*3/uL (ref 140–370)
RBC: 4.26 10*6/uL — ABNORMAL LOW (ref 4.60–6.10)
RDW: 14.5 % — ABNORMAL HIGH (ref 12.0–14.0)
Segs: 59 %
WBC: 7.5 10*3/uL (ref 4.0–10.0)

## 2019-07-15 LAB — BASIC METABOLIC PANEL, BLOOD
Anion Gap: 13 mmol/L (ref 7–15)
BUN: 14 mg/dL (ref 6–20)
Bicarbonate: 24 mmol/L (ref 22–29)
Calcium: 8.9 mg/dL (ref 8.5–10.6)
Chloride: 97 mmol/L — ABNORMAL LOW (ref 98–107)
Creatinine: 0.81 mg/dL (ref 0.67–1.17)
GFR: >60 mL/min
Glucose: 101 mg/dL — ABNORMAL HIGH (ref 70–99)
Potassium: 4.1 mmol/L (ref 3.5–5.1)
Sodium: 134 mmol/L — ABNORMAL LOW (ref 136–145)

## 2019-07-15 NOTE — Progress Notes (Signed)
Brian Ritter day:   11 days - Admitted on: 07/03/2019    Brian Ritter is a55 year oldmale with hx ofankylosing spondylitis, Ehler's Danlos, autism, and recent spinal fusion surgery c/b postop seroma with MRSA s/p I&D 06/13/19 and initiation of daptomycin twice with readmissions for trouble adhering to outpatient infusion therapy, who was admitted for MRSA seroma/bacteremia with negative repeat BCx.    24 Hour - Interval Events   None    Subjective   - Feels well, no changes    Physical Exam   Temp  Min: 98.3 F (36.8 C)  Max: 98.6 F (37 C)  Pulse  Min: 46  Max: 94  BP  Min: 130/67  Max: 133/60  Resp  Min: 18  Max: 18  SpO2  Min: 95 %  Max: 97 %    BP 130/67 (BP Location: Left leg, BP Patient Position: Semi-Fowlers)   Pulse (!) 46   Temp 98.6 F (37 C)   Resp 18   Ht 5\' 6"  (1.676 m)   Wt 72.6 kg (160 lb)   SpO2 95%   BMI 25.82 kg/m  O2 Device: None (Room air)      09/03 0600 - 09/04 0559  In: 850 [P.O.:350; I.V.:500]  Out: -   Urine x 0 Stool x 0 Emesis x 0     GENERAL EXAM: NAD, well-developed, well-nourished  SKIN:  Tenderness, induration, and edema over RUE at site of IV from last night tracking along vein. Erythema resolved. Also has two areas of induration on LUE at sites of former IV infiltration, improving. Minimal edema over surgical incision in back, tenderness resolved. No purulent drainage, fluctuance, or dehiscence.   CARDIAC: RRR. Normal S1, S2, no m/r/g  EXTREMITIES: WWP, no peripheral edema  NEUROLOGIC EXAM: Alert and oriented, fluent, conversant. Normal language output and comprehension. Moving all 4 extremities spontaneously    Pertinent Labs and Imaging and Meds     WBC 7.5 (09/04) HGB 12.1* (09/04) PLT 220 (09/04)    HCT 37.7* (09/04)      Na 134* (09/04) CL 97* (09/04) BUN 14 (09/04) GLU   101* (09/04)   K 4.1 (09/04) CO2 24 (09/04) Cr 0.81 (09/04)      BCx 8/23: GP cocci in clusters, presence of MRSA DNA  BCx 8/24-8/28, 8/31 NGTD    [place] upper  extremities:  1. Occlusive superficial thrombus within the bilateral cephalic veins.  2. No evidence of acute deep vein thrombosis within either upper extremity.    MRI thoracic and lumbar spine w/ and w/o contrast 07/04/19  1. Enhancement and incomplete fat suppression within the dorsal epidural fat from T6-T8, is concerning for infection/phlegmon within the epidural space; no visible epidural abscess within the limits of hardware artifact. Continued paraspinal inflammation and bilateral pleural effusions, without visible posterior mediastinal/pleural/retroperitoneal abscess. Dr. Dionisio Paschal was paged to alert him of findings at 1730 hours on 07/04/2019.    2. Persistent mild enhancement of the ventral cauda equina nerve roots as seen on prior MRI 06/21/2019.    3. Redemonstration of posterior decompression and instrumented fusion from T8-L1 with interval decrease in the size of a postoperative fluid collection in the midline subcutaneous tissues.    4. Multilevel degenerative changes without high-grade central canal stenosis in either the thoracic or lumbar spine, within the limitations of metallic artifact from the patient's spinal fusion hardware.    Scheduled Medications  . diclofenac  4 g 4x Daily   .  enoxaparin  40 mg Daily   . polyethylene glycol  17 g Daily   . sodium chloride  10 mL Q8H   . sodium chloride  3 mL Q8H   . vancomycin (VANCOCIN) IVPB  750 mg Q6H     Continuous Medications  . sodium chloride       PRN Medications  . heparin  100 Units Once PRN   . heparin  100 Units Once PRN   . ibuprofen  800 mg Q6H PRN   . lidocaine   Q4H PRN   . nalOXone  0.1 mg Q2 Min PRN   . oxyCODONE  5 mg Q4H PRN   . sodium chloride  10 mL PRN   . sodium chloride  3 mL PRN   . sodium chloride   Continuous PRN   . tiZANidine  4 mg Q6H PRN     Assessment / Plan   Brian Ritter is a43 year oldmale with hx ofankylosing spondylitis, Ehler's Danlos, autism, and recent spinal fusion surgery c/b postop seroma with MRSA s/p I&D  06/13/19 and initiation of daptomycin twice with readmissions for trouble adhering to outpatient infusion therapy, who was admitted for MRSA seroma/bacteremia with negative repeat BCx.    #MRSA T5-L3 seroma infection  Stable. Continuing IV abx for MRSA seroma infection from prior spinal surgery. Has failed to complete IV abx therapy as outpatient and also has hx of discomfort with PIV and has pulled out PICC, so prefer inpatient treatment. S/p PICC placement. Refusing SNF due to concerns of phonophobia and other triggers which make him uncomfortable.  - IV vancomycin x 6 weeks, per ID (IV abx restarted 8/24)  - Pain control: oxycodone 5mg  PO PRN, tylenol 975mg  q8h, topical lidocaine and diclofenac  - Decrease frequency of CBC to every third day    #Superficial thrombophlebitis  Improving. Monitoring for signs of compartment syndrome or infection. Coag panel unremarkable.  - Warm compress  - Diclofenac cream    #Hyponatremia  Stable. 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 Mr. Loletha CarrowHodgdon denies polydipsia. Low concern for more malignant causes of SIADH (no meningismus or cranial nerve deficits, HIV negative 05/2019). No symptoms of hyponatremia, so will CTM.  - CTM with BMP every third day    Discharge planning: placement and clinical improvement  Foley: No foley catheter  VTE prophylaxis: LMWH  Diet: Diet Regular  Last BM: unknown  IV fluids: No IV fluids  Access: PICC  Code status: Full Code    Brayton LaymanSonya Jeremiah Curci, MSIV  Patient care was discussed in detail with Talbert ForestMcIntyre, Jonathan Stewa*.

## 2019-07-15 NOTE — Plan of Care (Signed)
Problem: Promotion of Health and Safety  Goal: Promotion of Health and Safety  Description: The patient remains safe, receives appropriate treatment and achieves optimal outcomes (physically, psychosocially, and spiritually) within the limitations of the disease process by discharge.    Information below is the current care plan.  Outcome: Progressing  Flowsheets  Taken 07/15/2019 1511  Guidelines: Inpatient Nursing Guidelines  Individualized Interventions/Recommendations #3 (if applicable): provide with personal hygiene products to take a shower  Outcome Evaluation (rationale for progressing/not progressing) every shift: VSS, PRN pain meds given with adequate relief, PICC drsg changed d/t accidental removal by patient after shower, tolerating IV abx, showered independently, linen changed, will continue to monitor.  Taken 07/15/2019 0735  Patient /Family stated Goal: I'm good  Taken 07/14/2019 1427  Individualized Interventions/Recommendations #1: cluster care to help promote rest  Individualized Interventions/Recommendations #2 (if applicable): keep door closed to help decrease stimulation

## 2019-07-15 NOTE — Plan of Care (Signed)
Problem: Promotion of Health and Safety  Goal: Promotion of Health and Safety  Description: The patient remains safe, receives appropriate treatment and achieves optimal outcomes (physically, psychosocially, and spiritually) within the limitations of the disease process by discharge.    Information below is the current care plan.  Outcome: Progressing  Flowsheets  Taken 07/15/2019 0026 by Fransico Him, RN  Guidelines: Inpatient Nursing Guidelines  Taken 07/14/2019 2000 by Fransico Him, RN  Patient /Family stated Goal: pain control  Taken 07/14/2019 1427 by Antony Odea, RN  Individualized Interventions/Recommendations #1: cluster care to help promote rest  Individualized Interventions/Recommendations #2 (if applicable): keep door closed to help decrease stimulation  Outcome Evaluation (rationale for progressing/not progressing) every shift: VSS, PRN pain meds given with adequate relief, pt compliant with POC, tolerating IV abx, resting comfortably in bed, will continue to monitor.  Taken 07/13/2019 1119 by Joanna Puff, RN  Individualized Interventions/Recommendations #4 (if applicable): Applied Voltaren cream to RUE for swelling and redness.  Taken 07/11/2019 1200 by Oretha Ellis., RN  Individualized Interventions/Recommendations #3 (if applicable): Remind patient to call for assistance when feels weak or unsteady  Individualized Interventions/Recommendations #5 (if applicable): Monitor signs of infection

## 2019-07-16 ENCOUNTER — Ambulatory Visit (HOSPITAL_BASED_OUTPATIENT_CLINIC_OR_DEPARTMENT_OTHER): Payer: Medicaid Other

## 2019-07-16 DIAGNOSIS — T148XXA Other injury of unspecified body region, initial encounter: Secondary | ICD-10-CM

## 2019-07-16 DIAGNOSIS — B49 Unspecified mycosis: Secondary | ICD-10-CM

## 2019-07-16 DIAGNOSIS — L089 Local infection of the skin and subcutaneous tissue, unspecified: Secondary | ICD-10-CM

## 2019-07-16 NOTE — Plan of Care (Signed)
Problem: Promotion of Health and Safety  Goal: Promotion of Health and Safety  Description: The patient remains safe, receives appropriate treatment and achieves optimal outcomes (physically, psychosocially, and spiritually) within the limitations of the disease process by discharge.    Information below is the current care plan.  Outcome: Progressing  Flowsheets  Taken 07/16/2019 1000 by Ace Gins, RN  Patient /Family stated Goal: pain medication  Taken 07/16/2019 0317 by Clarnce Flock, RN  Individualized Interventions/Recommendations #1: cluster care to promote rest,sleep and healing  Individualized Interventions/Recommendations #2 (if applicable): put down drapes, close curtain, dimm lights  Individualized Interventions/Recommendations #3 (if applicable): keep frequentlyused items within easy reach HU:TMLY bell, overbed table, urinal etc  Individualized Interventions/Recommendations #4 (if applicable):   applied voltaren cream to aching right upper arm   medicate with PRN apin med as requested/ordered  Outcome Evaluation (rationale for progressing/not progressing) every shift: VSS, tolerating IV Vanco, no s/s of infectionnoted on picc line site, able to gets some sleep after pain med admin, will continue to monitor

## 2019-07-16 NOTE — Progress Notes (Signed)
MEDICINE DAILY PROGRESS NOTE     Hospital day:   12 days - Admitted on: 07/03/2019    Brian Ritter is a43 year oldmale with hx ofankylosing spondylitis, Ehler's Danlos, autism, and recent spinal fusion surgery c/b postop seroma with MRSA s/p I&D 06/13/19 and initiation of daptomycin twice with readmissions for trouble adhering to outpatient infusion therapy, who was admitted for MRSA seroma/bacteremia with negative repeat BCx.    24 Hour - Interval Events   No acute overnight events.     Subjective   Feels well. States pain is unchanged from prior. States pain regimen is adequate. Denies fever, chills, nausea, vomiting.     Physical Exam   Temp  Min: 98.9 F (37.2 C)  Max: 99.5 F (37.5 C)  Pulse  Min: 50  Max: 104  BP  Min: 124/60  Max: 150/63  Resp  Min: 16  Max: 18  SpO2  Min: 96 %  Max: 98 %    BP (!) 137/97 (BP Location: Left leg, BP Patient Position: Semi-Fowlers)   Pulse 50   Temp 99.4 F (37.4 C)   Resp 18   Ht 5\' 6"  (1.676 m)   Wt 72.6 kg (160 lb)   SpO2 98%   BMI 25.82 kg/m  O2 Device: None (Room air)      09/04 0600 - 09/05 0559  In: 1706 [P.O.:940; I.V.:766]  Out: -       GENERAL EXAM: NAD, well-developed, well-nourished  SKIN:  Minimal erythema of right bicep. Well healed, non-tender surgical incision in back. No purulent drainage, fluctuance, or dehiscence.   CARDIAC: RRR. Normal S1, S2, no m/r/g  PULM: CTAB, no use of accessory muscles   EXTREMITIES: WWP, no peripheral edema  NEUROLOGIC EXAM: Alert and oriented, fluent, conversant. Normal language output and comprehension. Moving all 4 extremities spontaneously    Pertinent Labs and Imaging and Meds     WBC 7.5 (09/04) HGB 12.1* (09/04) PLT 220 (09/04)    HCT 37.7* (09/04)      Na 134* (09/04) CL 97* (09/04) BUN 14 (09/04) GLU   101* (09/04)   K 4.1 (09/04) CO2 24 (09/04) Cr 0.81 (09/04)      BCx 8/23: GP cocci in clusters, presence of MRSA DNA  BCx 8/24-8/28, 8/31 NGTD    US upper extremities, 07/12/2019:  1. Occlusive superficial  thrombus within the bilateral cephalic veins.  2. No evidence of acute deep vein thrombosis within either upper extremity.    MRI thoracic and lumbar spine w/ and w/o contrast 07/04/19  1. Enhancement and incomplete fat suppression within the dorsal epidural fat from T6-T8, is concerning for infection/phlegmon within the epidural space; no visible epidural abscess within the limits of hardware artifact. Continued paraspinal inflammation and bilateral pleural effusions, without visible posterior mediastinal/pleural/retroperitoneal abscess. Dr. Richard MiuSeymann was paged to alert him of findings at 1730 hours on 07/04/2019.    2. Persistent mild enhancement of the ventral cauda equina nerve roots as seen on prior MRI 06/21/2019.    3. Redemonstration of posterior decompression and instrumented fusion from T8-L1 with interval decrease in the size of a postoperative fluid collection in the midline subcutaneous tissues.    4. Multilevel degenerative changes without high-grade central canal stenosis in either the thoracic or lumbar spine, within the limitations of metallic artifact from the patient's spinal fusion hardware.    Scheduled Medications  . diclofenac  4 g 4x Daily   . enoxaparin  40 mg Daily   . polyethylene  glycol  17 g Daily   . sodium chloride  10 mL Q8H   . sodium chloride  3 mL Q8H   . vancomycin (VANCOCIN) IVPB  750 mg Q6H     Continuous Medications  . sodium chloride       PRN Medications  . heparin  100 Units Once PRN   . heparin  100 Units Once PRN   . ibuprofen  800 mg Q6H PRN   . lidocaine   Q4H PRN   . nalOXone  0.1 mg Q2 Min PRN   . oxyCODONE  5 mg Q4H PRN   . sodium chloride  10 mL PRN   . sodium chloride  3 mL PRN   . sodium chloride   Continuous PRN   . tiZANidine  4 mg Q6H PRN     Assessment / Plan   Brian Ritter is a26 year oldmale with hx ofankylosing spondylitis, Ehler's Danlos, autism, and recent spinal fusion surgery c/b postop seroma with MRSA s/p I&D 06/13/19 and initiation of daptomycin  twice with readmissions for trouble adhering to outpatient infusion therapy, who was admitted for MRSA seroma/bacteremia with negative repeat BCx.    #MRSA T5-L3 seroma infection  Stable. Continuing IV abx for MRSA seroma infection from prior spinal surgery. Has failed to complete IV abx therapy as outpatient and also has hx of discomfort with PIV and has pulled out PICC, so prefer inpatient treatment. S/p PICC placement. Refusing SNF due to concerns of phonophobia and other triggers which make him uncomfortable.  - IV vancomycin x 6 weeks, per ID (IV abx restarted 8/24)  - Pain control: oxycodone 5mg  PO PRN, tylenol 975mg  q8h, topical lidocaine and diclofenac  - Decrease frequency of CBC to every third day    #Superficial thrombophlebitis  Improving. Monitoring for signs of compartment syndrome or infection. Coag panel unremarkable.  - Warm compress  - Diclofenac cream    #Hyponatremia  Stable at 134. 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 Low concern for more malignant causes of SIADH (no meningismus or cranial nerve deficits, HIV negative 05/2019). No symptoms of hyponatremia, so will CTM.  - CTM with BMP every third day    Discharge planning: placement and clinical improvement  Foley: No foley catheter  VTE prophylaxis: LMWH  Diet: Diet Regular  Last BM: unknown  IV fluids: No IV fluids  Access: PICC  Code status: Full Code    Patient care was discussed in detail with [place], Arkady, *.

## 2019-07-16 NOTE — Plan of Care (Signed)
Problem: Promotion of Health and Safety  Goal: Promotion of Health and Safety  Description: The patient remains safe, receives appropriate treatment and achieves optimal outcomes (physically, psychosocially, and spiritually) within the limitations of the disease process by discharge.    Information below is the current care plan.  Outcome: Progressing  Flowsheets  Taken 07/16/2019 0317  Individualized Interventions/Recommendations #1: cluster care to promote rest,sleep and healing  Individualized Interventions/Recommendations #2 (if applicable): put down drapes, close curtain, dimm lights  Individualized Interventions/Recommendations #3 (if applicable): keep frequentlyused items within easy reach FY:BOFB bell, overbed table, urinal etc  Individualized Interventions/Recommendations #4 (if applicable):   applied voltaren cream to aching right upper arm   medicate with PRN apin med as requested/ordered  Individualized Interventions/Recommendations #5 (if applicable): assess/monitor for worsening s/s of infection  Outcome Evaluation (rationale for progressing/not progressing) every shift: VSS, tolerating IV Vanco, no s/s of infectionnoted on picc line site, able to gets some sleep after pain med admin, will continue to monitor  Taken 07/15/2019 2030  Patient /Family stated Goal: get well and go home

## 2019-07-17 ENCOUNTER — Ambulatory Visit (HOSPITAL_BASED_OUTPATIENT_CLINIC_OR_DEPARTMENT_OTHER): Payer: Medicaid Other

## 2019-07-17 LAB — BLOOD CULTURE: Blood Culture Result: NO GROWTH

## 2019-07-17 MED ORDER — OXYCODONE HCL 5 MG OR TABS
7.5000 mg | ORAL_TABLET | ORAL | Status: DC | PRN
Start: 2019-07-17 — End: 2019-07-25
  Administered 2019-07-17 – 2019-07-24 (×40): 7.5 mg via ORAL
  Filled 2019-07-17 (×41): qty 2

## 2019-07-17 NOTE — Progress Notes (Signed)
MEDICINE DAILY PROGRESS NOTE     Hospital day:   13 days - Admitted on: 07/03/2019    Brian Ritter is a43 year oldmale with hx ofankylosing spondylitis, Ehler's Danlos, autism, and recent spinal fusion surgery c/b postop seroma with MRSA s/p I&D 06/13/19 and initiation of daptomycin twice with readmissions for trouble adhering to outpatient infusion therapy, who was admitted for MRSA seroma/bacteremia with negative repeat BCx.    24 Hour - Interval Events   No acute overnight events.     Subjective     He noticed a circle of decreased sensation in his mid-back in between his shoulder blades. It is not painful.    His right bicep pain is about the same.     Physical Exam   Temp  Min: 98 F (36.7 C)  Max: 98.5 F (36.9 C)  Pulse  Min: 81  Max: 95  BP  Min: 124/76  Max: 132/50  Resp  Min: 16  Max: 18  SpO2  Min: 96 %  Max: 98 %    BP 124/76 (BP Location: Right arm, BP Patient Position: Semi-Fowlers)   Pulse 88   Temp 98 F (36.7 C)   Resp 18   Ht 5\' 6"  (1.676 m)   Wt 72.6 kg (160 lb)   SpO2 98%   BMI 25.82 kg/m  O2 Device: None (Room air)      09/05 0600 - 09/06 0559  In: 759 [P.O.:250; I.V.:509]  Out: -       GENERAL EXAM: NAD, well-developed, well-nourished  SKIN:  Minimal erythema of right bicep. Well healed, non-tender surgical incision in back. No purulent drainage, fluctuance, or dehiscence.   CARDIAC: RRR. Normal S1, S2, no m/r/g  PULM: CTAB, no use of accessory muscles   EXTREMITIES: WWP, no peripheral edema  NEUROLOGIC EXAM: Alert and oriented, fluent, conversant. Normal language output and comprehension. Moving all 4 extremities spontaneously. Spot of hypoesthesia in between shoulder blades around T4/T5 dermatome.     Left upper extremity PICC clean, dry, and intact. No tenderness to palpation or erythema appreciated.       Pertinent Labs and Imaging and Meds     WBC 7.5 (09/04) HGB 12.1* (09/04) PLT 220 (09/04)    HCT 37.7* (09/04)      Na 134* (09/04) CL 97* (09/04) BUN 14 (09/04) GLU   101*  (09/04)   K 4.1 (09/04) CO2 24 (09/04) Cr 0.81 (09/04)      BCx 8/23: GP cocci in clusters, presence of MRSA DNA  BCx 8/24-8/28, 8/31 NGTD    US upper extremities, 07/12/2019:  1. Occlusive superficial thrombus within the bilateral cephalic veins.  2. No evidence of acute deep vein thrombosis within either upper extremity.    MRI thoracic and lumbar spine w/ and w/o contrast 07/04/19  1. Enhancement and incomplete fat suppression within the dorsal epidural fat from T6-T8, is concerning for infection/phlegmon within the epidural space; no visible epidural abscess within the limits of hardware artifact. Continued paraspinal inflammation and bilateral pleural effusions, without visible posterior mediastinal/pleural/retroperitoneal abscess. Dr. Richard MiuSeymann was paged to alert him of findings at 1730 hours on 07/04/2019.    2. Persistent mild enhancement of the ventral cauda equina nerve roots as seen on prior MRI 06/21/2019.    3. Redemonstration of posterior decompression and instrumented fusion from T8-L1 with interval decrease in the size of a postoperative fluid collection in the midline subcutaneous tissues.    4. Multilevel degenerative changes without high-grade central canal  stenosis in either the thoracic or lumbar spine, within the limitations of metallic artifact from the patient's spinal fusion hardware.    Scheduled Medications  . diclofenac  4 g 4x Daily   . enoxaparin  40 mg Daily   . polyethylene glycol  17 g Daily   . sodium chloride  10 mL Q8H   . sodium chloride  3 mL Q8H   . vancomycin (VANCOCIN) IVPB  750 mg Q6H     Continuous Medications  . sodium chloride       PRN Medications  . heparin  100 Units Once PRN   . heparin  100 Units Once PRN   . ibuprofen  800 mg Q6H PRN   . lidocaine   Q4H PRN   . nalOXone  0.1 mg Q2 Min PRN   . oxyCODONE  7.5 mg Q4H PRN   . sodium chloride  10 mL PRN   . sodium chloride  3 mL PRN   . sodium chloride   Continuous PRN   . tiZANidine  4 mg Q6H PRN     Assessment / Plan      Brian Ritter is a45 year oldmale with hx ofankylosing spondylitis, Ehler's Danlos, autism, and recent spinal fusion surgery c/b postop seroma with MRSA s/p I&D 06/13/19 and initiation of daptomycin twice with readmissions for trouble adhering to outpatient infusion therapy, who was admitted for MRSA seroma/bacteremia with negative repeat BCx.    #MRSA T5-L3 seroma infection  Stable. Continuing IV abx for MRSA seroma infection from prior spinal surgery. Has failed to complete IV abx therapy as outpatient and also has hx of discomfort with PIV and has pulled out PICC, so prefer inpatient treatment. S/p PICC placement. Refusing SNF due to concerns of phonophobia and other triggers which make him uncomfortable.  - IV vancomycin x 6 weeks, per ID (IV abx restarted 8/24)  - Pain control: oxycodone 7.5 mg PO PRN, tylenol 975mg  q8h, topical lidocaine and diclofenac  - weekly blood work and inflammatory markers     #Superficial thrombophlebitis  Improving. Monitoring for signs of compartment syndrome or infection. Coag panel unremarkable.  - Warm compress  - Diclofenac cream    #Hyponatremia  Stable at 134. 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 Low concern for more malignant causes of SIADH (no meningismus or cranial nerve deficits, HIV negative 05/2019). No symptoms of hyponatremia, so will CTM.    Discharge planning: placement and clinical improvement  Foley: No foley catheter  VTE prophylaxis: LMWH  Diet: Diet Regular  Last BM: unknown  IV fluids: No IV fluids  Access: PICC  Code status: Full Code    Denny Peon, MD  Assistant Clinical Professor  [facility]  Pager: [phone]

## 2019-07-17 NOTE — Plan of Care (Signed)
Problem: Promotion of Health and Safety  Goal: Promotion of Health and Safety  Description: The patient remains safe, receives appropriate treatment and achieves optimal outcomes (physically, psychosocially, and spiritually) within the limitations of the disease process by discharge.    Information below is the current care plan.  Outcome: Progressing  Flowsheets  Taken 07/17/2019 0019  Outcome Evaluation (rationale for progressing/not progressing) every shift: VSS, no s/s of worsening infection, tolerating IV Vanco, PICC line working well, fell asleep after taking oxycodone dose, will continue to monitor  Taken 07/16/2019 2050  Patient /Family stated Goal: get my pain med in 27minutes   Taken 07/16/2019 0317  Individualized Interventions/Recommendations #1: cluster care to promote rest,sleep and healing  Individualized Interventions/Recommendations #2 (if applicable): put down drapes, close curtain, dimm lights  Individualized Interventions/Recommendations #3 (if applicable): keep frequentlyused items within easy reach BP:PHKF bell, overbed table, urinal etc  Individualized Interventions/Recommendations #4 (if applicable):   applied voltaren cream to aching right upper arm   medicate with PRN apin med as requested/ordered

## 2019-07-17 NOTE — Plan of Care (Signed)
Problem: Promotion of Health and Safety  Goal: Promotion of Health and Safety  Description: The patient remains safe, receives appropriate treatment and achieves optimal outcomes (physically, psychosocially, and spiritually) within the limitations of the disease process by discharge.    Information below is the current care plan.  Outcome: Progressing  Flowsheets  Taken 07/17/2019 1143  Guidelines: Inpatient Nursing Guidelines  Individualized Interventions/Recommendations #1: Keep pt informed on pain medication schedule and update throughout shift.  Individualized Interventions/Recommendations #2 (if applicable): Close door and curtains per pt request, minimize interruptions to sleep  Individualized Interventions/Recommendations #3 (if applicable): Continue IV abx via PICC line.  Individualized Interventions/Recommendations #4 (if applicable): Collaborate with CM for d/c planning  Outcome Evaluation (rationale for progressing/not progressing) every shift: Pt cooperative in care this shift. Remains on IV vanco Q6hrs. Ambulating inside of room. CM continues to look for SNF placement. Will continue to monitor.  Taken 07/17/2019 0815  Patient /Family stated Goal: Just pain medicine and IV antibiotics

## 2019-07-18 DIAGNOSIS — T148XXA Other injury of unspecified body region, initial encounter: Secondary | ICD-10-CM

## 2019-07-18 DIAGNOSIS — B49 Unspecified mycosis: Secondary | ICD-10-CM

## 2019-07-18 DIAGNOSIS — L089 Local infection of the skin and subcutaneous tissue, unspecified: Secondary | ICD-10-CM

## 2019-07-18 LAB — CBC WITH DIFF, BLOOD
ANC-Automated: 5.1 10*3/uL (ref 1.6–7.0)
Abs Basophils: 0 10*3/uL
Abs Eosinophils: 0.2 10*3/uL (ref 0.0–0.5)
Abs Lymphs: 1.8 10*3/uL (ref 0.8–3.1)
Abs Monos: 1.1 10*3/uL — ABNORMAL HIGH (ref 0.2–0.8)
Basophils: 0 %
Eosinophils: 2 %
Hct: 39.3 % — ABNORMAL LOW (ref 40.0–50.0)
Hgb: 12.4 gm/dL — ABNORMAL LOW (ref 13.7–17.5)
Imm Gran %: 1 % (ref <1)
Imm Gran Abs: 0.1 10*3/uL (ref <0.1)
Lymphocytes: 22 %
MCH: 28.1 pg (ref 26.0–32.0)
MCHC: 31.6 g/dL — ABNORMAL LOW (ref 32.0–36.0)
MCV: 88.9 um3 (ref 79.0–95.0)
MPV: 9.2 fL — ABNORMAL LOW (ref 9.4–12.4)
Monocytes: 14 %
Plt Count: 183 10*3/uL (ref 140–370)
RBC: 4.42 10*6/uL — ABNORMAL LOW (ref 4.60–6.10)
RDW: 14.7 % — ABNORMAL HIGH (ref 12.0–14.0)
Segs: 61 %
WBC: 8.2 10*3/uL (ref 4.0–10.0)

## 2019-07-18 LAB — COMPREHENSIVE METABOLIC PANEL, BLOOD
ALT (SGPT): 217 U/L — ABNORMAL HIGH (ref 0–41)
AST (SGOT): 164 U/L — ABNORMAL HIGH (ref 0–40)
Albumin: 3.2 g/dL — ABNORMAL LOW (ref 3.5–5.2)
Alkaline Phos: 151 U/L — ABNORMAL HIGH (ref 40–129)
Anion Gap: 10 mmol/L (ref 7–15)
BUN: 13 mg/dL (ref 6–20)
Bicarbonate: 26 mmol/L (ref 22–29)
Bilirubin, Tot: 0.3 mg/dL (ref <1.2)
Calcium: 8.8 mg/dL (ref 8.5–10.6)
Chloride: 99 mmol/L (ref 98–107)
Creatinine: 0.8 mg/dL (ref 0.67–1.17)
GFR: >60 mL/min
Glucose: 100 mg/dL — ABNORMAL HIGH (ref 70–99)
Potassium: 4.3 mmol/L (ref 3.5–5.1)
Sodium: 135 mmol/L — ABNORMAL LOW (ref 136–145)
Total Protein: 7.7 g/dL (ref 6.0–8.0)

## 2019-07-18 LAB — VANCOMYCIN, TROUGH: Vancomycin, Trough: 25.7 ug/mL — ABNORMAL HIGH (ref 10.0–20.0)

## 2019-07-18 LAB — SED RATE, BLOOD: Sed Rate: 70 mm/hr — ABNORMAL HIGH (ref 0–15)

## 2019-07-18 LAB — C-REACTIVE PROTEIN, BLOOD: CRP: 4.64 mg/dL — ABNORMAL HIGH (ref <0.5)

## 2019-07-18 LAB — VANCOMYCIN, RANDOM: Vancomycin, Random: 24.7 ug/mL

## 2019-07-18 MED ORDER — GABAPENTIN 300 MG OR CAPS
300.0000 mg | ORAL_CAPSULE | Freq: Three times a day (TID) | ORAL | Status: DC
Start: 2019-07-18 — End: 2019-07-20
  Administered 2019-07-18 – 2019-07-20 (×6): 300 mg via ORAL
  Filled 2019-07-18 (×6): qty 1

## 2019-07-18 MED ORDER — VANCOMYCIN HCL 1 GM IV SOLR
600.0000 mg | Freq: Four times a day (QID) | INTRAVENOUS | Status: DC
Start: 2019-07-18 — End: 2019-07-20
  Administered 2019-07-18 – 2019-07-20 (×9): 600 mg via INTRAVENOUS
  Filled 2019-07-18 (×9): qty 600

## 2019-07-18 NOTE — Plan of Care (Signed)
Problem: Promotion of Health and Safety  Goal: Promotion of Health and Safety  Description: The patient remains safe, receives appropriate treatment and achieves optimal outcomes (physically, psychosocially, and spiritually) within the limitations of the disease process by discharge.    Information below is the current care plan.  Flowsheets  Taken 07/17/2019 1926 by Devonne Doughty, RN  Patient /Family stated Goal: control pain  Taken 07/17/2019 1143 by Yolande Jolly, RN  Individualized Interventions/Recommendations #1: Keep pt informed on pain medication schedule and update throughout shift.  Individualized Interventions/Recommendations #2 (if applicable): Close door and curtains per pt request, minimize interruptions to sleep  Individualized Interventions/Recommendations #3 (if applicable): Continue IV abx via PICC line.  Taken 07/16/2019 0317 by Clarnce Flock, RN  Individualized Interventions/Recommendations #5 (if applicable): assess/monitor for worsening s/s of infection

## 2019-07-18 NOTE — Progress Notes (Signed)
[facility] day:   14 days - Admitted on: 07/03/2019    Brian Ritter is a51 year oldmale with hx ofankylosing spondylitis, Ehler's Danlos, autism, and recent spinal fusion surgery c/b postop seroma with MRSA s/p I&D 06/13/19 and initiation of daptomycin twice with readmissions for trouble adhering to outpatient infusion therapy, who was admitted for MRSA seroma/bacteremia with negative repeat BCx.    24 Hour - Interval Events   No acute overnight events.     Subjective     Patient reports some increased sensation in RLE. Described as "tingling, tickling". Reports that gabapentin has worked well for neuropathic pain in past, up to maximum of 900 mg TID. Otherwise, without concerns.     Physical Exam   Temp  Min: 98 F (36.7 C)  Max: 98.2 F (36.8 C)  Pulse  Min: 51  Max: 98  BP  Min: 124/76  Max: 148/74  Resp  Min: 18  Max: 18  SpO2  Min: 98 %  Max: 99 %    BP 148/74 (BP Location: Right arm, BP Patient Position: Semi-Fowlers)   Pulse 51   Temp 98.2 F (36.8 C)   Resp 18   Ht 5' 6"  (1.676 m)   Wt 72.6 kg (160 lb)   SpO2 99%   BMI 25.82 kg/m  O2 Device: None (Room air)      09/06 0600 - 09/07 0559  In: 500 [P.O.:250; I.V.:250]  Out: -       GENERAL EXAM: NAD, well-developed, well-nourished  SKIN:  Minimal erythema of right bicep. Well healed, non-tender surgical incision in back. No purulent drainage, fluctuance, or dehiscence.   CARDIAC: RRR. Normal S1, S2, no m/r/g  PULM: CTAB, no use of accessory muscles   EXTREMITIES: WWP, no peripheral edema, sensation intact in bilateral LE   NEUROLOGIC EXAM: Alert and oriented, fluent, conversant. Normal language output and comprehension. Moving all 4 extremities spontaneously. Spot of hypoesthesia in between shoulder blades around T4/T5 dermatome.     Left upper extremity PICC clean, dry, and intact. No tenderness to palpation or erythema appreciated.       Pertinent Labs and Imaging and Meds     WBC 8.2 (09/07) HGB 12.4* (09/07) PLT  183 (09/07)    HCT 39.3* (09/07)      Na 135* (09/07) CL 99 (09/07) BUN 13 (09/07) GLU   100* (09/07)   K 4.3 (09/07) CO2 26 (09/07) Cr 0.80 (09/07)      ALK phos: 151  ALT: 217  AST: 164  Total bili: 0.30  Albumin: 3.2  Total protein: 7.7    BCx 8/23: GP cocci in clusters, presence of MRSA DNA  BCx 8/24-8/28, 8/31 NGTD    [place] upper extremities, 07/12/2019:  1. Occlusive superficial thrombus within the bilateral cephalic veins.  2. No evidence of acute deep vein thrombosis within either upper extremity.    MRI thoracic and lumbar spine w/ and w/o contrast 07/04/19  1. Enhancement and incomplete fat suppression within the dorsal epidural fat from T6-T8, is concerning for infection/phlegmon within the epidural space; no visible epidural abscess within the limits of hardware artifact. Continued paraspinal inflammation and bilateral pleural effusions, without visible posterior mediastinal/pleural/retroperitoneal abscess. Dr. Dionisio Paschal was paged to alert him of findings at 1730 hours on 07/04/2019.    2. Persistent mild enhancement of the ventral cauda equina nerve roots as seen on prior MRI 06/21/2019.    3. Redemonstration of posterior decompression and  instrumented fusion from T8-L1 with interval decrease in the size of a postoperative fluid collection in the midline subcutaneous tissues.    4. Multilevel degenerative changes without high-grade central canal stenosis in either the thoracic or lumbar spine, within the limitations of metallic artifact from the patient's spinal fusion hardware.    Scheduled Medications  . diclofenac  4 g 4x Daily   . enoxaparin  40 mg Daily   . gabapentin  300 mg TID   . polyethylene glycol  17 g Daily   . sodium chloride  10 mL Q8H   . sodium chloride  3 mL Q8H   . vancomycin (VANCOCIN) IVPB  750 mg Q6H     Continuous Medications  . sodium chloride       PRN Medications  . heparin  100 Units Once PRN   . heparin  100 Units Once PRN   . ibuprofen  800 mg Q6H PRN   . lidocaine   Q4H PRN   .  nalOXone  0.1 mg Q2 Min PRN   . oxyCODONE  7.5 mg Q4H PRN   . sodium chloride  10 mL PRN   . sodium chloride  3 mL PRN   . sodium chloride   Continuous PRN   . tiZANidine  4 mg Q6H PRN     Assessment / Plan   Brian Ritter is a47 year oldmale with hx ofankylosing spondylitis, Ehler's Danlos, autism, and recent spinal fusion surgery c/b postop seroma with MRSA s/p I&D 06/13/19 and initiation of daptomycin twice with readmissions for trouble adhering to outpatient infusion therapy, who was admitted for MRSA seroma/bacteremia with negative repeat BCx.    #MRSA T5-L3 seroma infection  Stable. Continuing IV abx for MRSA seroma infection from prior spinal surgery. Has failed to complete IV abx therapy as outpatient and also has hx of discomfort with PIV and has pulled out PICC, so prefer inpatient treatment. S/p PICC placement. Refusing SNF due to concerns of phonophobia and other triggers which make him uncomfortable.  - IV vancomycin x 6 weeks, per ID (IV abx restarted 8/24)  - Pain control: oxycodone 7.5 mg PO PRN, tylenol 923m q8h, topical lidocaine and diclofenac  - weekly blood work and inflammatory markers     #Superficial thrombophlebitis  Improving. Monitoring for signs of compartment syndrome or infection. Coag panel unremarkable.  - Warm compress  - Diclofenac cream    #RLE Discomfort   Likely neuropathic pain. Has had improvement with similar symptoms in past with gabapentin.  - will start gabapentin 300 mg po TID, can titrate as tolerates     #Elevated Liver Enzymes  Chronically elevated liver enzymes, with increase in this week's lab work. Has history of outside lab work with positive hepatitis ab and elevated viral load.  - repeat liver panel tomorrow     #Hyponatremia  Stable. 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 Low concern for more malignant causes of SIADH (no meningismus or cranial nerve deficits, HIV negative 05/2019). No symptoms of hyponatremia, so will CTM.    Discharge planning: placement and  clinical improvement  Foley: No foley catheter  VTE prophylaxis: LMWH  Diet: Diet Regular  Last BM: unknown  IV fluids: No IV fluids  Access: PICC  Code status: Full Code

## 2019-07-18 NOTE — Progress Notes (Signed)
Pharmacokinetics Note - Vancomycin  Vancomycin Indication: MRSA sarcoma (Goal: Trough 15 - 20 mg/L), started 07/08/2019  Vancomycin level: 24.7 mg/L on 07/18/2019 at 1051, random, at steady state  Kidney Function: SCr: 0.8 mg/dL, at baseline.  Culture results: 07/03/2019 BCx - MRSA (MIC = 1 mcg/mL)    Assessment / Plan:   Pharmacokinetic Parameters: Volume of distribution: 49 L, Clearance: 4.6 L/h, Half-life: 7 h  Current regimen of vancomycin 750 mg IV every 6 hours gives an estimated trough at steady state of 21 mg/L, which is supratherapeutic.   Will decrease dose to vancomycin 600 mg IV every 6 hours for an estimated steady state level of 15 mg/L and AUC of 517. Monitor kidney function and plan random level on 07/20/2019 or sooner if clinically indicated.    Pharmacist will continue to monitor and make adjustments as needed.    Eden Emms, PHARMD

## 2019-07-18 NOTE — Plan of Care (Signed)
Problem: Promotion of Health and Safety  Goal: Promotion of Health and Safety  Description: The patient remains safe, receives appropriate treatment and achieves optimal outcomes (physically, psychosocially, and spiritually) within the limitations of the disease process by discharge.    Information below is the current care plan.  Outcome: Progressing  Flowsheets  Taken 07/18/2019 1403 by Mikki Harbor, RN  Individualized Interventions/Recommendations #1: Clustered care to increase rest and sleep.  Individualized Interventions/Recommendations #2 (if applicable): Pt prefers all curtains closed and the door to his room closed at all times.  Individualized Interventions/Recommendations #3 (if applicable): Pt requested to speak with MD regarding his long term IV abx regimen and if he can go home and get outpt infsuion. Per md outpatient infusion is not possible since he has already failed to go twice and pt is refusing SNF so he will likely ahve to stay in the hospital for the infusions. Howvere, pt states he will not stay longer then this next week at the hospital.  Taken 07/18/2019 0800 by Mikki Harbor, RN  Patient /Family stated Goal: "I want to get some rest"  Taken 07/17/2019 1143 by Yolande Jolly, RN  Guidelines: Inpatient Nursing Guidelines

## 2019-07-19 ENCOUNTER — Ambulatory Visit (HOSPITAL_BASED_OUTPATIENT_CLINIC_OR_DEPARTMENT_OTHER): Payer: Medicaid Other

## 2019-07-19 LAB — LIVER PANEL, BLOOD
ALT (SGPT): 236 U/L — ABNORMAL HIGH (ref 0–41)
AST (SGOT): 161 U/L — ABNORMAL HIGH (ref 0–40)
Albumin: 3.4 g/dL — ABNORMAL LOW (ref 3.5–5.2)
Alkaline Phos: 151 U/L — ABNORMAL HIGH (ref 40–129)
Bilirubin, Dir: <0.2 mg/dL (ref <0.2)
Bilirubin, Tot: 0.3 mg/dL (ref <1.2)
Total Protein: 8.1 g/dL — ABNORMAL HIGH (ref 6.0–8.0)

## 2019-07-19 NOTE — Plan of Care (Signed)
Problem: Promotion of Health and Safety  Goal: Promotion of Health and Safety  Description: The patient remains safe, receives appropriate treatment and achieves optimal outcomes (physically, psychosocially, and spiritually) within the limitations of the disease process by discharge.    Information below is the current care plan.  Outcome: Progressing  Flowsheets  Taken 07/19/2019 1010 by Joanna Puff, RN  Guidelines: Inpatient Nursing Guidelines  Outcome Evaluation (rationale for progressing/not progressing) every shift: VSS. NSAD. Pt understands POC. Steady upon ambulation. Oxycodone 7.5 mg PRN for Pain management. Will continue to monitor.  Taken 07/19/2019 0337 by Clarnce Flock, RN  Individualized Interventions/Recommendations #1: cluster cae to promote rest and sleep, close door and curtains closed at all times  Individualized Interventions/Recommendations #2 (if applicable): monitor s/s of worsening infection

## 2019-07-19 NOTE — Plan of Care (Signed)
Problem: Promotion of Health and Safety  Goal: Promotion of Health and Safety  Description: The patient remains safe, receives appropriate treatment and achieves optimal outcomes (physically, psychosocially, and spiritually) within the limitations of the disease process by discharge.    Information below is the current care plan.  Outcome: Progressing  Flowsheets  Taken 07/19/2019 3845  Individualized Interventions/Recommendations #1: cluster cae to promote rest and sleep, close door and curtains closed at all times  Individualized Interventions/Recommendations #2 (if applicable): monitor s/s of worsening infection  Individualized Interventions/Recommendations #3 (if applicable): assess/moniotr for pain, medicate PRN  Individualized Interventions/Recommendations #5 (if applicable): alert charge RN pt's wish to be moved to [address]  Outcome Evaluation (rationale for progressing/not progressing) every shift: VSS, tolerated IV VAnco, remained asymptomatic of any infection, he claimed his Oxycodone 7.5mg  is not very effective for his pain  Taken 07/18/2019 2006  Patient /Family stated Goal: get my pain pill on tme"

## 2019-07-19 NOTE — Progress Notes (Signed)
[facility] day:   15 days - Admitted on: 07/03/2019    Brian Ritter is a8 year oldmale with hx ofankylosing spondylitis, Ehler's Danlos, autism, and recent spinal fusion surgery c/b postop seroma with MRSA s/p I&D 06/13/19 and initiation of daptomycin twice with readmissions for trouble adhering to outpatient infusion therapy, who was admitted for MRSA seroma/bacteremia with negative repeat BCx.    24 Hour - Interval Events   No acute overnight events.     Subjective     Patient reports RLE discomfort is unchanged from prior. Right bicep discomfort feels somewhat improved. Reports ongoing frustration at remaining in hospital for antibiotics, desire to receive antibiotics at infusion center. He is not amenable to SNF at this time. Otherwise without concerns.     Physical Exam   Temp  Min: 97.9 F (36.6 C)  Max: 98.9 F (37.2 C)  Pulse  Min: 88  Max: 95  BP  Min: 116/74  Max: 130/95  Resp  Min: 17  Max: 18  SpO2  Min: 98 %  Max: 100 %    BP (!) 130/95 (BP Location: Right arm, BP Patient Position: Sitting)   Pulse 92   Temp 97.9 F (36.6 C)   Resp 18   Ht 5' 6"  (1.676 m)   Wt 72.6 kg (160 lb)   SpO2 98%   BMI 25.82 kg/m  O2 Device: None (Room air)      09/07 0600 - 09/08 0559  In: 2696 [P.O.:1420; I.V.:1276]  Out: -       GENERAL EXAM: NAD, well-developed, well-nourished  SKIN:  Minimal erythema of right bicep. Well healed, non-tender surgical incision in back. No purulent drainage, fluctuance, or dehiscence.   CARDIAC: RRR. Normal S1, S2, no m/r/g  PULM: CTAB, no use of accessory muscles   EXTREMITIES: WWP, no peripheral edema, sensation intact in bilateral LE   NEUROLOGIC EXAM: Alert and oriented, fluent, conversant. Normal language output and comprehension. Moving all 4 extremities spontaneously.     Left upper extremity PICC clean, dry, and intact. No tenderness to palpation or erythema appreciated.       Pertinent Labs and Imaging and Meds     WBC 8.2 (09/07) HGB 12.4*  (09/07) PLT 183 (09/07)    HCT 39.3* (09/07)      Na 135* (09/07) CL 99 (09/07) BUN 13 (09/07) GLU   100* (09/07)   K 4.3 (09/07) CO2 26 (09/07) Cr 0.80 (09/07)      ALK phos: 151  ALT: 236  AST: 161  Total bili: 0.30  Albumin: 3.4  Total protein: 8.1     BCx 8/23: GP cocci in clusters, presence of MRSA DNA  BCx 8/24-8/28, 8/31 NGTD    [place] upper extremities, 07/12/2019:  1. Occlusive superficial thrombus within the bilateral cephalic veins.  2. No evidence of acute deep vein thrombosis within either upper extremity.    MRI thoracic and lumbar spine w/ and w/o contrast 07/04/19  1. Enhancement and incomplete fat suppression within the dorsal epidural fat from T6-T8, is concerning for infection/phlegmon within the epidural space; no visible epidural abscess within the limits of hardware artifact. Continued paraspinal inflammation and bilateral pleural effusions, without visible posterior mediastinal/pleural/retroperitoneal abscess. Dr. Dionisio Paschal was paged to alert him of findings at 1730 hours on 07/04/2019.    2. Persistent mild enhancement of the ventral cauda equina nerve roots as seen on prior MRI 06/21/2019.    3. Redemonstration of posterior  decompression and instrumented fusion from T8-L1 with interval decrease in the size of a postoperative fluid collection in the midline subcutaneous tissues.    4. Multilevel degenerative changes without high-grade central canal stenosis in either the thoracic or lumbar spine, within the limitations of metallic artifact from the patient's spinal fusion hardware.    Scheduled Medications  . diclofenac  4 g 4x Daily   . enoxaparin  40 mg Daily   . gabapentin  300 mg TID   . polyethylene glycol  17 g Daily   . sodium chloride  10 mL Q8H   . sodium chloride  3 mL Q8H   . vancomycin (VANCOCIN) IVPB  600 mg Q6H     Continuous Medications  . sodium chloride       PRN Medications  . heparin  100 Units Once PRN   . heparin  100 Units Once PRN   . ibuprofen  800 mg Q6H PRN   . lidocaine    Q4H PRN   . nalOXone  0.1 mg Q2 Min PRN   . oxyCODONE  7.5 mg Q4H PRN   . sodium chloride  10 mL PRN   . sodium chloride  3 mL PRN   . sodium chloride   Continuous PRN   . tiZANidine  4 mg Q6H PRN     Assessment / Plan   Brian Ritter is a39 year oldmale with hx ofankylosing spondylitis, Ehler's Danlos, autism, and recent spinal fusion surgery c/b postop seroma with MRSA s/p I&D 06/13/19 and initiation of daptomycin twice with readmissions for trouble adhering to outpatient infusion therapy, who was admitted for MRSA seroma/bacteremia with negative repeat BCx.    #MRSA T5-L3 seroma infection  Stable. Continuing IV abx for MRSA seroma infection from prior spinal surgery. Has failed to complete IV abx therapy as outpatient and also has hx of discomfort with PIV and has pulled out PICC, so prefer inpatient treatment. S/p PICC placement. Refusing SNF due to concerns of phonophobia and other triggers which make him uncomfortable.  - IV vancomycin x 6 weeks, per ID (IV abx restarted 8/24)  - Pain control: oxycodone 7.5 mg PO PRN, tylenol 974m q8h, topical lidocaine and diclofenac  - weekly blood work and inflammatory markers     #Superficial thrombophlebitis  Improving. Monitoring for signs of compartment syndrome or infection. Coag panel unremarkable.  - Warm compress  - Diclofenac cream    #RLE Discomfort   Likely neuropathic pain. Has had improvement with similar symptoms in past with gabapentin.  - will continue gabapentin 300 mg po TID, can titrate as tolerates     #Elevated Liver Enzymes, Chronic hepatitis C  Chronically elevated liver enzymes, with increase in this week's lab work. Repeat liver panel stable. Has history of outside lab work with positive hepatitis c ab and elevated viral load.   - outpatient hepatology follow up     #Hyponatremia  Stable. 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 Low concern for more malignant causes of SIADH (no meningismus or cranial nerve deficits, HIV negative 05/2019). No symptoms  of hyponatremia, so will CTM.    Discharge planning: placement and clinical improvement  Foley: No foley catheter  VTE prophylaxis: LMWH  Diet: Diet Regular  Last BM: unknown  IV fluids: No IV fluids  Access: PICC  Code status: Full Code

## 2019-07-19 NOTE — Interdisciplinary (Signed)
07/19/19 1518   Follow Up/Progress   Is the Patient Ready for Discharge * Yes   Barriers to Discharge * Other (Comment)  (Patient is refusing SNF and has failed out-pt IVABX.)   Patient/Family/Legal/Surrogate Decision Maker Has Been Given a List Options And Choice In The Selection of Post-Acute Care Providers * Yes   Patient/Family/Other Are In Agreement With Discharge Plan * To be determined       .07/19/19  3:26 PM    Medical Intervention(s) requiring continued Hospital Stay:  MRSA infected T5-L3 spine seroma- IV Vancomycin q8 until 08/15/19.     Anticipated dispo plan   Patient lives in his [name] with his partner and has failed X2 out-patient infusion therapy due to poor compliance.  Patient has been accepted at the [facility] and were approved and provided LOA form CM Sonia Baller [phone] at [facility] however patient is refusing to go to a SNF.  Spoke with CM Sonia Baller and asked if she could discuss possible no coverage but she said that they do not bill [employer] patients for SNF refusal.    Barriers to Discharge:  Patient refusing SNF placement.      Delman Cheadle, RN  Care Manager

## 2019-07-20 ENCOUNTER — Telehealth (HOSPITAL_BASED_OUTPATIENT_CLINIC_OR_DEPARTMENT_OTHER): Payer: Self-pay | Admitting: Infectious Disease

## 2019-07-20 ENCOUNTER — Ambulatory Visit (HOSPITAL_BASED_OUTPATIENT_CLINIC_OR_DEPARTMENT_OTHER): Payer: Medicaid Other

## 2019-07-20 LAB — VANCOMYCIN, RANDOM: Vancomycin, Random: 20.9 ug/mL

## 2019-07-20 MED ORDER — CLOTRIMAZOLE 1 % EX CREA
TOPICAL_CREAM | Freq: Two times a day (BID) | CUTANEOUS | Status: DC
Start: 2019-07-20 — End: 2019-07-25
  Administered 2019-07-20 – 2019-07-24 (×8): via TOPICAL
  Filled 2019-07-20: qty 14.17

## 2019-07-20 MED ORDER — VANCOMYCIN HCL 750 MG IV SOLR
750.0000 mg | Freq: Three times a day (TID) | INTRAVENOUS | Status: DC
Start: 2019-07-20 — End: 2019-07-25
  Administered 2019-07-20 – 2019-07-25 (×16): 750 mg via INTRAVENOUS
  Filled 2019-07-20 (×17): qty 750

## 2019-07-20 MED ORDER — GABAPENTIN 600 MG OR TABS
600.0000 mg | ORAL_TABLET | Freq: Three times a day (TID) | ORAL | Status: DC
Start: 2019-07-20 — End: 2019-07-25
  Administered 2019-07-20 – 2019-07-25 (×16): 600 mg via ORAL
  Filled 2019-07-20 (×16): qty 1

## 2019-07-20 NOTE — Telephone Encounter (Signed)
Parish from [employer] calling stated she received information from [employer] that Pt would only be covered for professional and facility fee before 07/26/2019 after 07/27/2019 only for facility fee and the professional fee would be under [place].  Sim Boast is recommending not to schedule before we received letter of agreement for risk of non payment. Please review.   Thank you   Ph [phone]

## 2019-07-20 NOTE — Progress Notes (Signed)
[facility] day:   16 days - Admitted on: 07/03/2019    Brian Ritter is a46 year oldmale with hx ofankylosing spondylitis, Ehler's Danlos, autism, and recent spinal fusion surgery c/b postop seroma with MRSA s/p I&D 06/13/19 and initiation of daptomycin twice with readmissions for trouble adhering to outpatient infusion therapy, who was admitted for MRSA seroma/bacteremia with negative repeat BCx.    24 Hour - Interval Events   No acute overnight events.     Subjective     Patient reports continued RLE "tingling, burning", slightly improved from prior. Believes gabapentin is helping, requests increased dose. Reports he has previously tolerated 900 mg TID with good effect. Reports a new rash in right inguinal region for approximately two days. Reports rash is red and associated with itching, mild pain. This note Probation officer informed patient that outpatient infusion center is not an option for completion of antibiotics given he has previously failed outpatient antibiotics twice, and patient expressed understanding. Otherwise without concern.     Physical Exam   Temp  Min: 97.6 F (36.4 C)  Max: 98.3 F (36.8 C)  Pulse  Min: 60  Max: 93  BP  Min: 117/66  Max: 136/84  Resp  Min: 16  Max: 18  SpO2  Min: 96 %  Max: 98 %    BP 117/66 (BP Location: Right arm, BP Patient Position: Supine)   Pulse 60   Temp 98.2 F (36.8 C)   Resp 16   Ht 5\' 6"  (1.676 m)   Wt 72.6 kg (160 lb)   SpO2 97%   BMI 25.82 kg/m  O2 Device: None (Room air)      09/08 0600 - 09/09 0559  In: 956 [P.O.:440; I.V.:516]  Out: -       GENERAL EXAM: NAD, well-developed, well-nourished  SKIN:  Minimal erythema of right bicep. Well healed, non-tender surgical incision in back. No purulent drainage, fluctuance, or dehiscence.   CARDIAC: RRR. Normal S1, S2, no m/r/g  PULM: CTAB, no use of accessory muscles   EXTREMITIES: WWP, no peripheral edema, sensation intact in bilateral LE   NEUROLOGIC EXAM: Alert and oriented, fluent,  conversant. Normal language output and comprehension. Moving all 4 extremities spontaneously.   SKIN: erythematous plaque in right inguinal fold, with raised scaly border, no crusting or drainage    Left upper extremity PICC clean, dry, and intact. No tenderness to palpation or erythema appreciated.       Pertinent Labs and Imaging and Meds     WBC 8.2 (09/07) HGB 12.4* (09/07) PLT 183 (09/07)    HCT 39.3* (09/07)      Na 135* (09/07) CL 99 (09/07) BUN 13 (09/07) GLU   100* (09/07)   K 4.3 (09/07) CO2 26 (09/07) Cr 0.80 (09/07)        BCx 8/23: GP cocci in clusters, presence of MRSA DNA  BCx 8/24-8/28, 8/31 NGTD    [place] upper extremities, 07/12/2019:  1. Occlusive superficial thrombus within the bilateral cephalic veins.  2. No evidence of acute deep vein thrombosis within either upper extremity.    MRI thoracic and lumbar spine w/ and w/o contrast 07/04/19  1. Enhancement and incomplete fat suppression within the dorsal epidural fat from T6-T8, is concerning for infection/phlegmon within the epidural space; no visible epidural abscess within the limits of hardware artifact. Continued paraspinal inflammation and bilateral pleural effusions, without visible posterior mediastinal/pleural/retroperitoneal abscess. Dr. Dionisio Paschal was paged to alert him of  findings at 1730 hours on 07/04/2019.    2. Persistent mild enhancement of the ventral cauda equina nerve roots as seen on prior MRI 06/21/2019.    3. Redemonstration of posterior decompression and instrumented fusion from T8-L1 with interval decrease in the size of a postoperative fluid collection in the midline subcutaneous tissues.    4. Multilevel degenerative changes without high-grade central canal stenosis in either the thoracic or lumbar spine, within the limitations of metallic artifact from the patient's spinal fusion hardware.    Scheduled Medications  . clotrimazole   BID   . diclofenac  4 g 4x Daily   . enoxaparin  40 mg Daily   . gabapentin  600 mg TID   .  polyethylene glycol  17 g Daily   . sodium chloride  10 mL Q8H   . sodium chloride  3 mL Q8H   . vancomycin (VANCOCIN) IVPB  600 mg Q6H     Continuous Medications  . sodium chloride       PRN Medications  . heparin  100 Units Once PRN   . heparin  100 Units Once PRN   . ibuprofen  800 mg Q6H PRN   . lidocaine   Q4H PRN   . nalOXone  0.1 mg Q2 Min PRN   . oxyCODONE  7.5 mg Q4H PRN   . sodium chloride  10 mL PRN   . sodium chloride  3 mL PRN   . sodium chloride   Continuous PRN   . tiZANidine  4 mg Q6H PRN     Assessment / Plan   Brian Ritter is a43 year oldmale with hx ofankylosing spondylitis, Ehler's Danlos, autism, and recent spinal fusion surgery c/b postop seroma with MRSA s/p I&D 06/13/19 and initiation of daptomycin twice with readmissions for trouble adhering to outpatient infusion therapy, who was admitted for MRSA seroma/bacteremia with negative repeat BCx.    #MRSA T5-L3 seroma infection  Stable. Continuing IV abx for MRSA seroma infection from prior spinal surgery. Has failed to complete IV abx therapy as outpatient and also has hx of discomfort with PIV and has pulled out PICC, so prefer inpatient treatment. S/p PICC placement. Refusing SNF due to concerns of phonophobia and other triggers which make him uncomfortable. Patient requesting to complete antibiotics in outpatient infusion center; however have informed patient this is not an option given he has previously failed outpatient antibiotics twice.   - IV vancomycin x 6 weeks, per ID (IV abx restarted 8/24)  - Pain control: oxycodone 7.5 mg PO PRN, tylenol 975mg  q8h, topical lidocaine and diclofenac  - weekly blood work and inflammatory markers     #Superficial thrombophlebitis  Improving. Monitoring for signs of compartment syndrome or infection. Coag panel unremarkable.  - Warm compress  - Diclofenac cream    #RLE Discomfort   Likely neuropathic pain. Has had improvement with similar symptoms in past with gabapentin, previously tolerated dose of  900 mg TID.  - will titrate to gabapentin 600 mg TID, can titrate as tolerates     #Tinea corporis, right inguinal region  - clotrimazole cream to right inguinal region BID     #Elevated Liver Enzymes, Chronic hepatitis C  Chronically elevated liver enzymes, with increase in this week's lab work. Repeat liver panel stable. Has history of outside lab work with positive hepatitis c ab and elevated viral load.   - outpatient hepatology follow up     #Hyponatremia  Stable. 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  Low concern for more malignant causes of SIADH (no meningismus or cranial nerve deficits, HIV negative 05/2019). No symptoms of hyponatremia, so will CTM.    Discharge planning: placement and clinical improvement  Foley: No foley catheter  VTE prophylaxis: LMWH  Diet: Diet Regular  Last BM: unknown  IV fluids: No IV fluids  Access: PICC  Code status: Full Code

## 2019-07-20 NOTE — Progress Notes (Signed)
Pharmacokinetics Note - Vancomycin  Vancomycin Indication: MRSA seroma (Goal: Trough 15 - 20 mg/L), started 07/08/19  Vancomycin level: 20.9 mg/L on 07/20/19 at 05:30, trough, at steady state. Of note, vancomycin was charted as being given before level was drawn, but per next shift RN, lab was drawn appropriately.  Kidney Function: SCr: 0.80 mg/dL, at baseline.  Culture results: 8/23 Blood Cx: MRSA (MIC = 1 mcg/mL)    Assessment / Plan:    Pharmacokinetic Parameters: Volume of distribution: 50 L, Clearance: 4.0 L/h, Half-life: 8.6 h   Current regimen of vancomycin 600 mg IV Q6H gives an estimated trough at steady state of 17.7 mg/L and AUC 598, which is therapeutic. However, assuming level this morning was drawn appropriately, 20.9 mg/L is close to a true trough and is supratherapeutic.   Will decrease dose to vancomycin 750 mg IV Q8H for an estimated steady state level of 15.2 mg/L and AUIC 561. Monitor kidney function and plan trough level on 07/22/19 or sooner if clinically indicated.    Pharmacist will continue to monitor and make adjustments as needed.    Waylan Rocher, PharmD  PGY1 Acute Care Pharmacy Resident  Pager [phone]

## 2019-07-20 NOTE — Interdisciplinary (Signed)
Nutrition Note     Note Type: Progress Screening      HPI Per MD: 57 year oldmale with hx ofankylosing spondylitis, Ehler's Danlos, autism, and recent spinal fusion surgery c/b postop seroma with MRSA s/p I&D 06/13/19 and initiation of daptomycin twice with readmissions for trouble adhering to outpatient infusion therapy, who was admitted for MRSA seroma/bacteremia with negative repeat BCx.     Nutrition Summary:     Current Diet Rx: Diet Regular  Source of Information: Chart Review;Unable to interview patient  Barriers to Intake: None: good appetite & oral intake  Additional Comments: abnormal liver panel. will request order Premier Protein BID any flavor. for wt loss and fair appetite.     Tray Items Taken for the past 168 hrs:   Number of Items Taken Number of Items on Tray Diet Tolerance   07/13/19 1320 3 5 Tolerates   07/14/19 0900 5 5 Tolerates   07/14/19 1300 5 5 Tolerates   07/15/19 0820 3.5 5 Tolerates   07/15/19 1200 5 5 Tolerates   07/15/19 1835 5 6 Tolerates   07/16/19 1000 4 4 Tolerates   07/17/19 0715 4 4 Tolerates   07/18/19 0700 8 8 Tolerates   07/18/19 1236 5 6 Tolerates   07/18/19 1827 5 5 Tolerates   07/19/19 0812 3 5 Tolerates   07/19/19 1412 4 5 Tolerates   07/20/19 0850 3 4 Tolerates     No data found.  No data found.  Medication Review Comments: reviewed    Anthropometrics:  Height: 5' 6"  (167.6 cm)  Weight For Nutrition Equations: 72.6 kg (160 lb)  Weight for Equations Reflects?: wt 8/23  Ideal Body Weight (kg): 64.36  Percent of Ideal Body Weight: 112.76 %  BMI for Nutrition Calculations: 25.82           Weight Hx: suggest weekly wts. 9lb wt loss in 2 weeks. no new wt  Wt Readings from Last 20 Encounters:   07/03/19 72.6 kg (160 lb)   06/28/19 76.7 kg (169 lb 3.2 oz)   06/25/19 74.8 kg (165 lb)   06/24/19 76.2 kg (168 lb)   06/21/19 76.9 kg (169 lb 8.5 oz)     Date Weight Recorded 07/03/2019 06/28/2019 06/25/2019 06/24/2019 06/21/2019 06/20/2019   Metric 72.576 kg 76.749 kg 74.844 kg 76.204 kg  76.9 kg 76.9 kg   Pounds/Ounces 160 lb 169 lb 3.2 oz 165 lb 168 lb 169 lb 8.5 oz 169 lb 8.5 oz        Clinical Considerations:   Allergies:   Allergies   Allergen Reactions    Wound Dressing Adhesive Hives       GI:  Stool Assessment for the past 168 hrs:   Stool Amount Stool Occurrence Stool Color Stool Appearance   07/14/19 0508 Large 1 Brown Soft formed       Skin Integrity:  Skin Integrity (WDL): Exceptions to WDL  Skin Integrity  Skin Color: Normal for ethnicity  Skin Temp: Warm;Dry  Generalized Skin Integrity: Ecchymosis    Wounds/Incisions:       Pressure Injuries:       Edema:         Labs: reviewed   Recent Labs     07/18/19  0630 07/19/19  0500   NA 135*  --    K 4.3  --    CL 99  --    BICARB 26  --    BUN 13  --    CREAT 0.80  --  GLU 100*  --    [place] 8.8  --    ALK 151* 151*   ALT 217* 236*   AST 164* 161*   TBILI 0.30 0.30   DBILI  --  <0.2   ALB 3.2* 3.4*   CRP 4.64*  --    WBC 8.2  --    ABSNEUTRO 5.1  --        No results found for: CHOL, HDL, LDLCALC, TRIG, LDLDIRECT    No results found for: A1C    No results for input(s): GLUCPOCT in the last 72 hours.    No results found for: VITD25HYDROX, VITAMIND25HY, VD2, VD3, VDT    Medications  IV:    sodium chloride       Scheduled:    clotrimazole   BID    diclofenac  4 g 4x Daily    enoxaparin  40 mg Daily    gabapentin  600 mg TID    polyethylene glycol  17 g Daily    sodium chloride  10 mL Q8H    sodium chloride  3 mL Q8H    vancomycin (VANCOCIN) IVPB  750 mg Q8H       Discharge: pending clinical course,      Education: when clinically appropriate, provided education on  , handout provided from [place] or [employer],      RD/DTR to monitor/evaluate: labs, wt trend, and po/nutrition support status, and S/S of new skin concerns.  Relayed findings to RD.    Will continue to follow patient per approved [facility] Nutrition Prioritization Schedule guidelines. Nutrition Services remains available via [facility] should patient medical  status change.    Will request order for weekly wts and Premier Protein BID any flavor     Kathrin Penner, [place]    07/20/19

## 2019-07-20 NOTE — Plan of Care (Signed)
Problem: Promotion of Health and Safety  Goal: Promotion of Health and Safety  Description: The patient remains safe, receives appropriate treatment and achieves optimal outcomes (physically, psychosocially, and spiritually) within the limitations of the disease process by discharge.    Information below is the current care plan.  Outcome: Progressing  Flowsheets  Taken 07/20/2019 1145  Guidelines: Inpatient Nursing Guidelines  Individualized Interventions/Recommendations #1: cluster care to help promote rest  Individualized Interventions/Recommendations #2 (if applicable): keep door closed per pt request  Individualized Interventions/Recommendations #3 (if applicable): keep iPad charged, call IT to help figure out settings for pt  Outcome Evaluation (rationale for progressing/not progressing) every shift: VSS, tolerating IV abx, PRN pain meds given with adequate relief, lotrimin cream ordered for rash on thigh, pt instructed to keep area clean and dry and to apply cream twice a day, verbalized understanding, resting comfortably in bed, will continue to monitor.  Taken 07/20/2019 0745  Patient /Family stated Goal: talk to the doctor about my rash

## 2019-07-20 NOTE — Plan of Care (Signed)
Problem: Promotion of Health and Safety  Goal: Promotion of Health and Safety  Description: The patient remains safe, receives appropriate treatment and achieves optimal outcomes (physically, psychosocially, and spiritually) within the limitations of the disease process by discharge.    Information below is the current care plan.  Outcome: Progressing  Flowsheets  Taken 07/20/2019 0245  Outcome Evaluation (rationale for progressing/not progressing) every shift: VSS, tolerating IV Vanco, no s/s of worsening infection, Oxycodone 7.5mg  PRN given for pain control, received it almost every 4hours, goal ongoing  Taken 07/19/2019 0337  Individualized Interventions/Recommendations #1: cluster care to promote rest and sleep, close door and curtains  at all times  Individualized Interventions/Recommendations #2 (if applicable): monitor s/s of worsening infection  Individualized Interventions/Recommendations #3 (if applicable): assess/monitor for pain, medicate PRN

## 2019-07-21 ENCOUNTER — Ambulatory Visit (HOSPITAL_BASED_OUTPATIENT_CLINIC_OR_DEPARTMENT_OTHER): Payer: Medicaid Other

## 2019-07-21 NOTE — Plan of Care (Signed)
Problem: Promotion of Health and Safety  Goal: Promotion of Health and Safety  Description: The patient remains safe, receives appropriate treatment and achieves optimal outcomes (physically, psychosocially, and spiritually) within the limitations of the disease process by discharge.    Information below is the current care plan.  Outcome: Progressing  Flowsheets  Taken 07/21/2019 0249 by Lorin Picket, RN  Patient /Family stated Goal: to not have a loud roommate because I have autism  Guidelines: Inpatient Nursing Guidelines  Individualized Interventions/Recommendations #3 (if applicable): Keep iPad charged.  Outcome Evaluation (rationale for progressing/not progressing) every shift:   Pt was upset when he got a roommate because his bed alarm went off which triggered him   requested to not have a roommate because he can't be overstimulated d/t autism. Roommate was moved to a different room. Receiving IV vancomycin as ordered. PRN pain med given for pain. Hemodynamically stable. Goal ongoing.  Taken 07/20/2019 1145 by Antony Odea, RN  Individualized Interventions/Recommendations #1: cluster care to help promote rest  Individualized Interventions/Recommendations #2 (if applicable): keep door closed per pt request  Taken 07/17/2019 1143 by Yolande Jolly, RN  Individualized Interventions/Recommendations #4 (if applicable): Collaborate with CM for d/c planning

## 2019-07-21 NOTE — Progress Notes (Signed)
MEDICINE DAILY PROGRESS NOTE     Hospital day:   17 days - Admitted on: 07/03/2019    Brian Ritter is a43 year oldmale with hx ofankylosing spondylitis, Ehler's Danlos, autism, and recent spinal fusion surgery c/b postop seroma with MRSA s/p I&D 06/13/19 and initiation of daptomycin twice with readmissions for trouble adhering to outpatient infusion therapy, who was admitted for MRSA seroma/bacteremia with negative repeat BCx.    24 Hour - Interval Events   No acute overnight events.     Subjective   Patient in better mood today, pain better with gabapentin.     Physical Exam   Temp  Min: 97.7 F (36.5 C)  Max: 98.7 F (37.1 C)  Pulse  Min: 50  Max: 94  BP  Min: 101/74  Max: 143/65  Resp  Min: 16  Max: 18  SpO2  Min: 96 %  Max: 98 %    BP 143/65 (BP Location: Right arm, BP Patient Position: Semi-Fowlers)   Pulse 50   Temp 98.7 F (37.1 C)   Resp 16   Ht 5\' 6"  (1.676 m)   Wt 72.6 kg (160 lb)   SpO2 98%   BMI 25.82 kg/m  O2 Device: None (Room air)      09/09 0600 - 09/10 0559  In: 1520 [P.O.:1260; I.V.:260]  Out: -       GENERAL EXAM: NAD, well-developed, well-nourished  SKIN:  Minimal erythema of right bicep. Well healed, non-tender surgical incision in back. No purulent drainage, fluctuance, or dehiscence.   CARDIAC: RRR. Normal S1, S2, no m/r/g  PULM: CTAB, no use of accessory muscles   EXTREMITIES: WWP, no peripheral edema, sensation intact in bilateral LE   NEUROLOGIC EXAM: Alert and oriented, fluent, conversant. Normal language output and comprehension. Moving all 4 extremities spontaneously.   SKIN: erythematous plaque in right inguinal fold, with raised scaly border, no crusting or drainage    Left upper extremity PICC clean, dry, and intact. No tenderness to palpation or erythema appreciated.       Pertinent Labs and Imaging and Meds     WBC   HGB   PLT      HCT        Na   CL   BUN   GLU       K   CO2   Cr          BCx 8/23: GP cocci in clusters, presence of MRSA DNA  BCx 8/24-8/28, 8/31  NGTD    [place] upper extremities, 07/12/2019:  1. Occlusive superficial thrombus within the bilateral cephalic veins.  2. No evidence of acute deep vein thrombosis within either upper extremity.    MRI thoracic and lumbar spine w/ and w/o contrast 07/04/19  1. Enhancement and incomplete fat suppression within the dorsal epidural fat from T6-T8, is concerning for infection/phlegmon within the epidural space; no visible epidural abscess within the limits of hardware artifact. Continued paraspinal inflammation and bilateral pleural effusions, without visible posterior mediastinal/pleural/retroperitoneal abscess. Dr. Richard MiuSeymann was paged to alert him of findings at 1730 hours on 07/04/2019.    2. Persistent mild enhancement of the ventral cauda equina nerve roots as seen on prior MRI 06/21/2019.    3. Redemonstration of posterior decompression and instrumented fusion from T8-L1 with interval decrease in the size of a postoperative fluid collection in the midline subcutaneous tissues.    4. Multilevel degenerative changes without high-grade central canal stenosis in either the thoracic or lumbar spine, within  the limitations of metallic artifact from the patient's spinal fusion hardware.    Scheduled Medications  . clotrimazole   BID   . diclofenac  4 g 4x Daily   . enoxaparin  40 mg Daily   . gabapentin  600 mg TID   . polyethylene glycol  17 g Daily   . sodium chloride  10 mL Q8H   . sodium chloride  3 mL Q8H   . vancomycin (VANCOCIN) IVPB  750 mg Q8H     Continuous Medications  . sodium chloride       PRN Medications  . heparin  100 Units Once PRN   . heparin  100 Units Once PRN   . ibuprofen  800 mg Q6H PRN   . lidocaine   Q4H PRN   . nalOXone  0.1 mg Q2 Min PRN   . oxyCODONE  7.5 mg Q4H PRN   . sodium chloride  10 mL PRN   . sodium chloride  3 mL PRN   . sodium chloride   Continuous PRN   . tiZANidine  4 mg Q6H PRN     Assessment / Plan   Brian Ritter is a18 year oldmale with hx ofankylosing spondylitis, Ehler's Danlos,  autism, and recent spinal fusion surgery c/b postop seroma with MRSA s/p I&D 06/13/19 and initiation of daptomycin twice with readmissions for trouble adhering to outpatient infusion therapy, who was admitted for MRSA seroma/bacteremia with negative repeat BCx.    #MRSA T5-L3 seroma infection  Stable. Continuing IV abx for MRSA seroma infection from prior spinal surgery. Has failed to complete IV abx therapy as outpatient and also has hx of discomfort with PIV and has pulled out PICC, so prefer inpatient treatment. S/p PICC placement. Refusing SNF due to concerns of phonophobia and other triggers which make him uncomfortable. Patient requesting to complete antibiotics in outpatient infusion center; however have informed patient this is not an option given he has previously failed outpatient antibiotics twice.   - IV vancomycin x 6 weeks, per ID (IV abx restarted 8/24)  - Pain control: oxycodone 7.5 mg PO PRN, tylenol 975mg  q8h, topical lidocaine and diclofenac  - weekly blood work and inflammatory markers     #Superficial thrombophlebitis  Improving. Monitoring for signs of compartment syndrome or infection. Coag panel unremarkable.  - Warm compress  - Diclofenac cream    #RLE Discomfort   Likely neuropathic pain. Has had improvement with similar symptoms in past with gabapentin, previously tolerated dose of 900 mg TID.  - will titrate to gabapentin 600 mg TID, can titrate as tolerates     #Tinea corporis, right inguinal region  - clotrimazole cream to right inguinal region BID     #Elevated Liver Enzymes, Chronic hepatitis C  Chronically elevated liver enzymes, with increase in this week's lab work. Repeat liver panel stable. Has history of outside lab work with positive hepatitis c ab and elevated viral load.   - outpatient hepatology follow up     #Hyponatremia  Stable. 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 Low concern for more malignant causes of SIADH (no meningismus or cranial nerve deficits, HIV negative 05/2019).  No symptoms of hyponatremia, so will CTM.    Discharge planning: placement and clinical improvement  Foley: No foley catheter  VTE prophylaxis: LMWH  Diet: Diet Regular  Nutritional Supplement Premier Protein; Chocolate (PP) (or vanilla); Deliver Supplements: BID  Last BM: unknown  IV fluids: No IV fluids  Access: PICC  Code status: Full  Code

## 2019-07-21 NOTE — Plan of Care (Signed)
Problem: Promotion of Health and Safety  Goal: Promotion of Health and Safety  Description: The patient remains safe, receives appropriate treatment and achieves optimal outcomes (physically, psychosocially, and spiritually) within the limitations of the disease process by discharge.    Information below is the current care plan.  Outcome: Progressing  Flowsheets  Taken 07/21/2019 1534 by Antony Odea, RN  Guidelines: Inpatient Nursing Guidelines  Outcome Evaluation (rationale for progressing/not progressing) every shift: VSS, pt compliant with POC, tried to speak with pt regarding SNF and refused to speak about it and stated will not go to SNF to finish abx course, resting comfortably in bed, PRN pain meds given with adequate relief, will continue to monitor.  Taken 07/21/2019 0745 by Antony Odea, RN  Patient /Family stated Goal: I'm fine  Taken 07/21/2019 0249 by Lorin Picket, RN  Individualized Interventions/Recommendations #3 (if applicable): Keep iPad charged.  Taken 07/20/2019 1145 by Antony Odea, RN  Individualized Interventions/Recommendations #1: cluster care to help promote rest  Individualized Interventions/Recommendations #2 (if applicable): keep door closed per pt request

## 2019-07-22 ENCOUNTER — Inpatient Hospital Stay (HOSPITAL_COMMUNITY): Payer: Medicaid Other

## 2019-07-22 ENCOUNTER — Ambulatory Visit (HOSPITAL_BASED_OUTPATIENT_CLINIC_OR_DEPARTMENT_OTHER): Payer: Medicaid Other

## 2019-07-22 DIAGNOSIS — R202 Paresthesia of skin: Secondary | ICD-10-CM

## 2019-07-22 DIAGNOSIS — Z981 Arthrodesis status: Secondary | ICD-10-CM

## 2019-07-22 DIAGNOSIS — R937 Abnormal findings on diagnostic imaging of other parts of musculoskeletal system: Secondary | ICD-10-CM

## 2019-07-22 DIAGNOSIS — R200 Anesthesia of skin: Secondary | ICD-10-CM

## 2019-07-22 DIAGNOSIS — Z8614 Personal history of Methicillin resistant Staphylococcus aureus infection: Secondary | ICD-10-CM

## 2019-07-22 DIAGNOSIS — Z9889 Other specified postprocedural states: Secondary | ICD-10-CM

## 2019-07-22 LAB — COMPREHENSIVE METABOLIC PANEL, BLOOD
ALT (SGPT): 159 U/L — ABNORMAL HIGH (ref 0–41)
AST (SGOT): 87 U/L — ABNORMAL HIGH (ref 0–40)
Albumin: 3.4 g/dL — ABNORMAL LOW (ref 3.5–5.2)
Alkaline Phos: 143 U/L — ABNORMAL HIGH (ref 40–129)
Anion Gap: 11 mmol/L (ref 7–15)
BUN: 19 mg/dL (ref 6–20)
Bicarbonate: 27 mmol/L (ref 22–29)
Bilirubin, Tot: 0.3 mg/dL (ref <1.2)
Calcium: 8.9 mg/dL (ref 8.5–10.6)
Chloride: 98 mmol/L (ref 98–107)
Creatinine: 1.01 mg/dL (ref 0.67–1.17)
GFR: >60 mL/min
Glucose: 106 mg/dL — ABNORMAL HIGH (ref 70–99)
Potassium: 4.2 mmol/L (ref 3.5–5.1)
Sodium: 136 mmol/L (ref 136–145)
Total Protein: 7.8 g/dL (ref 6.0–8.0)

## 2019-07-22 LAB — CBC WITH DIFF, BLOOD
ANC-Automated: 5.8 10*3/uL (ref 1.6–7.0)
Abs Basophils: 0 10*3/uL
Abs Eosinophils: 0.3 10*3/uL (ref 0.0–0.5)
Abs Lymphs: 1.9 10*3/uL (ref 0.8–3.1)
Abs Monos: 0.6 10*3/uL (ref 0.2–0.8)
Basophils: 0 %
Eosinophils: 4 %
Hct: 39.3 % — ABNORMAL LOW (ref 40.0–50.0)
Hgb: 12.6 gm/dL — ABNORMAL LOW (ref 13.7–17.5)
Imm Gran %: 1 % (ref <1)
Imm Gran Abs: 0.1 10*3/uL (ref <0.1)
Lymphocytes: 21 %
MCH: 28.2 pg (ref 26.0–32.0)
MCHC: 32.1 g/dL (ref 32.0–36.0)
MCV: 87.9 um3 (ref 79.0–95.0)
MPV: 9.4 fL (ref 9.4–12.4)
Monocytes: 7 %
Plt Count: 169 10*3/uL (ref 140–370)
RBC: 4.47 10*6/uL — ABNORMAL LOW (ref 4.60–6.10)
RDW: 14.8 % — ABNORMAL HIGH (ref 12.0–14.0)
Segs: 67 %
WBC: 8.6 10*3/uL (ref 4.0–10.0)

## 2019-07-22 LAB — C-REACTIVE PROTEIN, BLOOD: CRP: 1.52 mg/dL — ABNORMAL HIGH (ref <0.5)

## 2019-07-22 LAB — SED RATE, BLOOD: Sed Rate: 53 mm/hr — ABNORMAL HIGH (ref 0–15)

## 2019-07-22 LAB — VANCOMYCIN, TROUGH: Vancomycin, Trough: 16.8 ug/mL (ref 10.0–20.0)

## 2019-07-22 MED ORDER — LORAZEPAM 1 MG OR TABS
1.0000 mg | ORAL_TABLET | Freq: Once | ORAL | Status: AC | PRN
Start: 2019-07-22 — End: 2019-07-22
  Administered 2019-07-22 (×2): 1 mg via ORAL
  Filled 2019-07-22: qty 1

## 2019-07-22 MED ORDER — LORAZEPAM 2 MG/ML IJ SOLN
1.0000 mg | Freq: Once | INTRAMUSCULAR | Status: AC | PRN
Start: 2019-07-22 — End: 2019-07-22
  Administered 2019-07-22 (×2): 1 mg via INTRAVENOUS
  Filled 2019-07-22: qty 1

## 2019-07-22 MED ORDER — LORAZEPAM 2 MG/ML IJ SOLN
1.0000 mg | Freq: Once | INTRAMUSCULAR | Status: AC
Start: 2019-07-22 — End: 2019-07-22
  Administered 2019-07-22: 1 mg via INTRAVENOUS
  Filled 2019-07-22: qty 1

## 2019-07-22 NOTE — Progress Notes (Signed)
MEDICINE DAILY PROGRESS NOTE     Hospital day:   18 days - Admitted on: 07/03/2019    Brian Ritter is a43 year oldmale with hx ofankylosing spondylitis, Ehler's Danlos, autism, and recent spinal fusion surgery c/b postop seroma with MRSA s/p I&D 06/13/19 and initiation of daptomycin twice with readmissions for trouble adhering to outpatient infusion therapy, who was admitted for MRSA seroma/bacteremia with negative repeat BCx.    24 Hour - Interval Events   No acute overnight events.     Subjective   Feeling a worsening numb patch over his back at incision site, stable symptoms of RLE paresthesia.     Physical Exam   Temp  Min: 98 F (36.7 C)  Max: 98.8 F (37.1 C)  Pulse  Min: 61  Max: 114  BP  Min: 115/60  Max: 124/55  Resp  Min: 16  Max: 17  SpO2  Min: 96 %  Max: 98 %    BP 117/78 (BP Location: Right arm, BP Patient Position: Semi-Fowlers)   Pulse 114   Temp 98.8 F (37.1 C)   Resp 16   Ht 5\' 6"  (1.676 m)   Wt 72.6 kg (160 lb)   SpO2 98%   BMI 25.82 kg/m  O2 Device: None (Room air)      09/10 0600 - 09/11 0559  In: 1870 [P.O.:1360; I.V.:510]  Out: -       GENERAL EXAM: NAD, well-developed, well-nourished  SKIN:  Minimal erythema of right bicep. Well healed, non-tender surgical incision in back. No purulent drainage, fluctuance, or dehiscence.   CARDIAC: RRR. Normal S1, S2, no m/r/g  PULM: CTAB, no use of accessory muscles   EXTREMITIES: WWP, no peripheral edema, mild decrease in sensation to light touch in Right leg which he says is normal since admission,   NEUROLOGIC EXAM: Alert and oriented, fluent, conversant. Normal language output and comprehension. Moving all 4 extremities spontaneously.   SKIN: erythematous plaque in right inguinal fold, with raised scaly border, no crusting or drainage    Left upper extremity PICC clean, dry, and intact. No tenderness to palpation or erythema appreciated.       Pertinent Labs and Imaging and Meds     WBC   HGB   PLT      HCT        Na   CL   BUN   GLU          K   CO2   Cr          BCx 8/23: GP cocci in clusters, presence of MRSA DNA  BCx 8/24-8/28, 8/31 NGTD    [place] upper extremities, 07/12/2019:  1. Occlusive superficial thrombus within the bilateral cephalic veins.  2. No evidence of acute deep vein thrombosis within either upper extremity.    MRI thoracic and lumbar spine w/ and w/o contrast 07/04/19  1. Enhancement and incomplete fat suppression within the dorsal epidural fat from T6-T8, is concerning for infection/phlegmon within the epidural space; no visible epidural abscess within the limits of hardware artifact. Continued paraspinal inflammation and bilateral pleural effusions, without visible posterior mediastinal/pleural/retroperitoneal abscess. Dr. Richard Miu was paged to alert him of findings at 1730 hours on 07/04/2019.    2. Persistent mild enhancement of the ventral cauda equina nerve roots as seen on prior MRI 06/21/2019.    3. Redemonstration of posterior decompression and instrumented fusion from T8-L1 with interval decrease in the size of a postoperative fluid collection in the midline subcutaneous  tissues.    4. Multilevel degenerative changes without high-grade central canal stenosis in either the thoracic or lumbar spine, within the limitations of metallic artifact from the patient's spinal fusion hardware.    Scheduled Medications  . clotrimazole   BID   . diclofenac  4 g 4x Daily   . enoxaparin  40 mg Daily   . gabapentin  600 mg TID   . polyethylene glycol  17 g Daily   . sodium chloride  10 mL Q8H   . sodium chloride  3 mL Q8H   . vancomycin (VANCOCIN) IVPB  750 mg Q8H     Continuous Medications  . sodium chloride       PRN Medications  . heparin  100 Units Once PRN   . heparin  100 Units Once PRN   . ibuprofen  800 mg Q6H PRN   . lidocaine   Q4H PRN   . LORazepam  1 mg Once PRN   . nalOXone  0.1 mg Q2 Min PRN   . oxyCODONE  7.5 mg Q4H PRN   . sodium chloride  10 mL PRN   . sodium chloride  3 mL PRN   . sodium chloride   Continuous PRN   .  tiZANidine  4 mg Q6H PRN     Assessment / Plan   Brian Ritter is a8 year oldmale with hx ofankylosing spondylitis, Ehler's Danlos, autism, and recent spinal fusion surgery c/b postop seroma with MRSA s/p I&D 06/13/19 and initiation of daptomycin twice with readmissions for trouble adhering to outpatient infusion therapy, who was admitted for MRSA seroma/bacteremia with negative repeat BCx.    #MRSA T5-L3 seroma infection  Stable. Continuing IV abx for MRSA seroma infection from prior spinal surgery. Has failed to complete IV abx therapy as outpatient and also has hx of discomfort with PIV and has pulled out PICC, so prefer inpatient treatment. S/p PICC placement. Refusing SNF due to concerns of phonophobia and other triggers which make him uncomfortable. Patient requesting to complete antibiotics in outpatient infusion center; however have informed patient this is not an option given he has previously failed outpatient antibiotics twice.   - check MRI T/L spine due to possible neurologic changes today.   - check labs   - IV vancomycin x 6 weeks, per ID (IV abx restarted 8/24)  - Pain control: oxycodone 7.5 mg PO PRN, tylenol 975mg  q8h, topical lidocaine and diclofenac  - weekly blood work and inflammatory markers     #Superficial thrombophlebitis  Improving. Monitoring for signs of compartment syndrome or infection. Coag panel unremarkable.  - Warm compress  - Diclofenac cream    #RLE Discomfort   Likely neuropathic pain. Has had improvement with similar symptoms in past with gabapentin, previously tolerated dose of 900 mg TID.  - will titrate to gabapentin 600 mg TID, can titrate as tolerates     #Tinea corporis, right inguinal region  - clotrimazole cream to right inguinal region BID     #Elevated Liver Enzymes, Chronic hepatitis C  Chronically elevated liver enzymes, with increase in this week's lab work. Repeat liver panel stable. Has history of outside lab work with positive hepatitis c ab and elevated  viral load.   - outpatient hepatology follow up     #Hyponatremia  Stable. 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 Low concern for more malignant causes of SIADH (no meningismus or cranial nerve deficits, HIV negative 05/2019). No symptoms of hyponatremia, so will CTM.  Discharge planning: placement and clinical improvement  Foley: No foley catheter  VTE prophylaxis: LMWH  Diet: Diet Regular  Nutritional Supplement Premier Protein; Chocolate (PP) (or vanilla); Deliver Supplements: BID  Last BM: unknown  IV fluids: No IV fluids  Access: PICC  Code status: Full Code

## 2019-07-22 NOTE — Interdisciplinary (Signed)
RE: [phone]    "Corie, Vavra 601B  Pls order pre-med anti-anxiety IV for MRI of spine. Pls advise, thanks."

## 2019-07-22 NOTE — Plan of Care (Signed)
Problem: Promotion of Health and Safety  Goal: Promotion of Health and Safety  Description: The patient remains safe, receives appropriate treatment and achieves optimal outcomes (physically, psychosocially, and spiritually) within the limitations of the disease process by discharge.    Information below is the current care plan.  07/22/2019 0406 by Lorin Picket, RN  Outcome: Progressing  Flowsheets  Taken 07/22/2019 0406 by Lorin Picket, RN  Patient /Family stated Goal: Charge iPad  Guidelines: Inpatient Nursing Guidelines  Outcome Evaluation (rationale for progressing/not progressing) every shift:   Hemodynamically stable. Pt was upset while using the hospital's iPad   he claimed that the poem he was typing and wanting to post online in the iPad was erased and wanted to speak to the I.T. to help fix iPad. Will continue to monitor.  Taken 07/22/2019 0148 by Lorin Picket, RN  Individualized Interventions/Recommendations #3 (if applicable): Pt requesting to speak with I.T. requiring hospital's iPad technical difficulties.  Taken 07/20/2019 1145 by Antony Odea, RN  Individualized Interventions/Recommendations #1: cluster care to help promote rest  Individualized Interventions/Recommendations #2 (if applicable): keep door closed per pt request  Taken 07/17/2019 1143 by Yolande Jolly, RN  Individualized Interventions/Recommendations #4 (if applicable): Collaborate with CM for d/c planning  07/22/2019 0152 by Lorin Picket, RN  Outcome: Progressing  Flowsheets  Taken 07/22/2019 0151 by Lorin Picket, RN  Guidelines: Inpatient Nursing Guidelines  Outcome Evaluation (rationale for progressing/not progressing) every shift: VSS,  Taken 07/22/2019 0148 by Lorin Picket, RN  Individualized Interventions/Recommendations #3 (if applicable): Pt requesting to speak with I.T. requiring hospital's iPad technical difficulties.  Taken 07/21/2019 0745 by Antony Odea, RN  Patient  /Family stated Goal: I'm fine  Taken 07/20/2019 1145 by Antony Odea, RN  Individualized Interventions/Recommendations #1: cluster care to help promote rest  Individualized Interventions/Recommendations #2 (if applicable): keep door closed per pt request  Taken 07/17/2019 1143 by Yolande Jolly, RN  Individualized Interventions/Recommendations #4 (if applicable): Collaborate with CM for d/c planning  07/22/2019 0150 by Lorin Picket, RN  Outcome: Progressing  Flowsheets  Taken 07/22/2019 0148 by Lorin Picket, RN  Guidelines: Inpatient Nursing Guidelines  Individualized Interventions/Recommendations #3 (if applicable): Pt requesting to speak with I.T. requiring hospital's iPad technical difficulties.  Outcome Evaluation (rationale for progressing/not progressing) every shift: VSS, vanco IV infusion and pain regimen ongoing. Pt  Taken 07/21/2019 0745 by Antony Odea, RN  Patient /Family stated Goal: I'm fine  Taken 07/20/2019 1145 by Antony Odea, RN  Individualized Interventions/Recommendations #1: cluster care to help promote rest  Individualized Interventions/Recommendations #2 (if applicable): keep door closed per pt request

## 2019-07-22 NOTE — Progress Notes (Signed)
Pharmacokinetics Note - Vancomycin  Vancomycin Indication: MRSA seroma (Goal: Trough 15 - 20 mg/L, AUIC 400-600), started 8/24, plan for 6 week treatment  Vancomycin level: 16.8 mg/L on 9/11 at 0500, trough, at steady state  Kidney Function: SCr: 0.8 mg/dL on 9/7, at baseline.  Culture results: 8/23 blood: MRSA, MIC 1    Assessment / Plan:   Pharmacokinetic Parameters: Volume of distribution: 51 L, Clearance: 4 L/h, Half-life: 8 h  Current regimen of 750 mg IV q8h gives an estimated trough at steady state of 16 mg/L, AUIC 565, which are therapeutic.   Will continue current dose. Monitor kidney function and plan trough level on 9/14 or sooner if clinically indicated.    Pharmacist will continue to monitor and make adjustments as needed.    Nawarat Jari Favre, PHARMD

## 2019-07-22 NOTE — Plan of Care (Signed)
Problem: Promotion of Health and Safety  Goal: Promotion of Health and Safety  Description: The patient remains safe, receives appropriate treatment and achieves optimal outcomes (physically, psychosocially, and spiritually) within the limitations of the disease process by discharge.    Information below is the current care plan.  Outcome: Progressing  Flowsheets  Taken 07/22/2019 1103 by Cherylann Ratel, RN  Individualized Interventions/Recommendations #5 (if applicable): Set limits and boundaries  Outcome Evaluation (rationale for progressing/not progressing) every shift: Patient angry ,loud at the beginning of the shift.Wanting to leave the unit d/t 601a had episodes of bm.Patient states You need to clean him up right now. I cant stand the smell.Patient was sitting on the floor by the front door of his room and push his breakfast tray on the ground. Security called.I need ativan to calm me down.Md made aware of patient behavior,seen patient and ordered ativan po.Patient Calm at this time.Resting.Will continue to monitor patient.  Taken 07/22/2019 0732 by Cherylann Ratel, RN  Patient /Family stated Goal: i need ativan  Taken 07/22/2019 0148 by Lorin Picket, RN  Individualized Interventions/Recommendations #3 (if applicable): Pt requesting to speak with I.T. requiring hospital's iPad technical difficulties.  Taken 07/20/2019 1145 by Antony Odea, RN  Individualized Interventions/Recommendations #1: cluster care to help promote rest  Individualized Interventions/Recommendations #2 (if applicable): keep door closed per pt request  Taken 07/17/2019 1143 by Yolande Jolly, RN  Individualized Interventions/Recommendations #4 (if applicable): Collaborate with CM for d/c planning

## 2019-07-23 ENCOUNTER — Other Ambulatory Visit: Payer: Self-pay

## 2019-07-23 ENCOUNTER — Ambulatory Visit (HOSPITAL_BASED_OUTPATIENT_CLINIC_OR_DEPARTMENT_OTHER): Payer: Medicaid Other

## 2019-07-23 LAB — COMPREHENSIVE METABOLIC PANEL, BLOOD
ALT (SGPT): 155 U/L — ABNORMAL HIGH (ref 0–41)
ALT (SGPT): 154 U/L — ABNORMAL HIGH (ref 0–41)
AST (SGOT): 96 U/L — ABNORMAL HIGH (ref 0–40)
AST (SGOT): 93 U/L — ABNORMAL HIGH (ref 0–40)
Albumin: 3.3 g/dL — ABNORMAL LOW (ref 3.5–5.2)
Albumin: 3.4 g/dL — ABNORMAL LOW (ref 3.5–5.2)
Alkaline Phos: 144 U/L — ABNORMAL HIGH (ref 40–129)
Alkaline Phos: 137 U/L — ABNORMAL HIGH (ref 40–129)
Anion Gap: 13 mmol/L (ref 7–15)
Anion Gap: 9 mmol/L (ref 7–15)
BUN: 22 mg/dL — ABNORMAL HIGH (ref 6–20)
BUN: 21 mg/dL — ABNORMAL HIGH (ref 6–20)
Bicarbonate: 24 mmol/L (ref 22–29)
Bicarbonate: 26 mmol/L (ref 22–29)
Bilirubin, Tot: 0.4 mg/dL (ref <1.2)
Bilirubin, Tot: 0.4 mg/dL (ref <1.2)
Calcium: 8.8 mg/dL (ref 8.5–10.6)
Calcium: 8.9 mg/dL (ref 8.5–10.6)
Chloride: 95 mmol/L — ABNORMAL LOW (ref 98–107)
Chloride: 98 mmol/L (ref 98–107)
Creatinine: 1.01 mg/dL (ref 0.67–1.17)
Creatinine: 1 mg/dL (ref 0.67–1.17)
GFR: >60 mL/min
GFR: >60 mL/min
Glucose: 121 mg/dL — ABNORMAL HIGH (ref 70–99)
Glucose: 121 mg/dL — ABNORMAL HIGH (ref 70–99)
Potassium: 3.8 mmol/L (ref 3.5–5.1)
Potassium: 4.2 mmol/L (ref 3.5–5.1)
Sodium: 132 mmol/L — ABNORMAL LOW (ref 136–145)
Sodium: 133 mmol/L — ABNORMAL LOW (ref 136–145)
Total Protein: 7.6 g/dL (ref 6.0–8.0)
Total Protein: 7.7 g/dL (ref 6.0–8.0)

## 2019-07-23 LAB — CBC WITH DIFF, BLOOD
ANC-Automated: 7.5 10*3/uL — ABNORMAL HIGH (ref 1.6–7.0)
Abs Basophils: 0 10*3/uL
Abs Eosinophils: 0.2 10*3/uL (ref 0.0–0.5)
Abs Lymphs: 1.2 10*3/uL (ref 0.8–3.1)
Abs Monos: 0.7 10*3/uL (ref 0.2–0.8)
Basophils: 0 %
Eosinophils: 2 %
Hct: 37 % — ABNORMAL LOW (ref 40.0–50.0)
Hgb: 12 gm/dL — ABNORMAL LOW (ref 13.7–17.5)
Imm Gran Abs: 0.1 10*3/uL (ref <0.1)
Lymphocytes: 13 %
MCH: 28.3 pg (ref 26.0–32.0)
MCHC: 32.4 g/dL (ref 32.0–36.0)
MCV: 87.3 um3 (ref 79.0–95.0)
MPV: 9.5 fL (ref 9.4–12.4)
Monocytes: 7 %
Plt Count: 149 10*3/uL (ref 140–370)
RBC: 4.24 10*6/uL — ABNORMAL LOW (ref 4.60–6.10)
RDW: 14.9 % — ABNORMAL HIGH (ref 12.0–14.0)
Segs: 78 %
WBC: 9.6 10*3/uL (ref 4.0–10.0)

## 2019-07-23 LAB — URINALYSIS WITH CULTURE REFLEX, WHEN INDICATED
Bilirubin: NEGATIVE
Blood: NEGATIVE
Glucose: NEGATIVE
Ketones: NEGATIVE
Leuk Esterase: NEGATIVE Leu/uL
Nitrite: NEGATIVE
Protein: NEGATIVE
Specific Gravity: 1.022 (ref 1.002–1.030)
Urobilinogen: NEGATIVE
pH: 6 (ref 5.0–8.0)

## 2019-07-23 LAB — MAGNESIUM, BLOOD: Magnesium: 1.9 mg/dL (ref 1.6–2.6)

## 2019-07-23 LAB — C-REACTIVE PROTEIN, BLOOD: CRP: 2.49 mg/dL — ABNORMAL HIGH (ref <0.5)

## 2019-07-23 LAB — LACTATE, BLOOD: Lactate: 1.7 mmol/L (ref 0.5–2.0)

## 2019-07-23 LAB — PHOSPHORUS, BLOOD: Phosphorous: 3.5 mg/dL (ref 2.7–4.5)

## 2019-07-23 MED ORDER — SODIUM CHLORIDE 0.9 % IV BOLUS
500.0000 mL | INJECTION | Freq: Once | INTRAVENOUS | Status: AC
Start: 2019-07-23 — End: 2019-07-23
  Administered 2019-07-23: 500 mL via INTRAVENOUS

## 2019-07-23 MED ORDER — LORAZEPAM 1 MG OR TABS
1.0000 mg | ORAL_TABLET | Freq: Once | ORAL | Status: AC
Start: 2019-07-23 — End: 2019-07-23
  Administered 2019-07-23: 17:00:00 1 mg via ORAL
  Filled 2019-07-23: qty 1

## 2019-07-23 MED ORDER — LORAZEPAM 2 MG/ML IJ SOLN
1.0000 mg | Freq: Four times a day (QID) | INTRAMUSCULAR | Status: DC | PRN
Start: 2019-07-23 — End: 2019-07-25
  Administered 2019-07-23 – 2019-07-25 (×5): 1 mg via INTRAVENOUS
  Filled 2019-07-23 (×5): qty 1

## 2019-07-23 MED ORDER — SODIUM CHLORIDE 0.9 % IV SOLN
3375.0000 mg | Freq: Three times a day (TID) | INTRAVENOUS | Status: DC
Start: 2019-07-23 — End: 2019-07-24
  Administered 2019-07-23 – 2019-07-24 (×3): 3375 mg via INTRAVENOUS
  Filled 2019-07-23 (×2): qty 3375

## 2019-07-23 MED ORDER — LORAZEPAM 1 MG OR TABS
1.0000 mg | ORAL_TABLET | Freq: Once | ORAL | Status: AC
Start: 2019-07-23 — End: 2019-07-23
  Administered 2019-07-23: 14:00:00 1 mg via ORAL
  Filled 2019-07-23: qty 1

## 2019-07-23 MED ORDER — IBUPROFEN 400 MG OR TABS
800.0000 mg | ORAL_TABLET | Freq: Once | ORAL | Status: AC
Start: 2019-07-23 — End: 2019-07-23
  Administered 2019-07-23 (×2): 800 mg via ORAL
  Filled 2019-07-23: qty 2

## 2019-07-23 MED ORDER — GADOBUTROL 1 MMOL/ML IV SOLN
INTRAVENOUS | Status: AC
Start: 2019-07-23 — End: 2019-07-23
  Filled 2019-07-23: qty 7.5

## 2019-07-23 MED ORDER — SODIUM CHLORIDE 0.9 % IV SOLN
4500.0000 mg | Freq: Once | INTRAVENOUS | Status: DC
Start: 2019-07-23 — End: 2019-07-23
  Filled 2019-07-23: qty 4500

## 2019-07-23 MED ORDER — SODIUM CHLORIDE 0.9 % IV SOLN
4500.0000 mg | Freq: Once | INTRAVENOUS | Status: AC
Start: 2019-07-23 — End: 2019-07-23
  Administered 2019-07-23 (×2): 4500 mg via INTRAVENOUS
  Filled 2019-07-23: qty 4500

## 2019-07-23 MED ORDER — GADOBUTROL 1 MMOL/ML IV SOLN
7.0000 mL | Freq: Once | INTRAVENOUS | Status: AC
Start: 2019-07-23 — End: 2019-07-23
  Administered 2019-07-23: 7 mL via INTRAVENOUS
  Filled 2019-07-23: qty 7

## 2019-07-23 NOTE — Interdisciplinary (Signed)
Patient requested pain medication.  Pain medication was provided.  Resource RN, Rod Holler, asked patient to open mouth to indicate that patient had taken PO medication because per primary RN Phuong patient tends to pocket pain medication.  Patient started to yell at the resource nurse and indicated that in the two weeks he has been here he has never been "treated like a child and disrespected in such way."

## 2019-07-23 NOTE — Consults (Signed)
Neurosurgery Consultation  Brian Ritter  43 year old male  [id number]      Referring Attending MD:   Ronette Deterruong, Alex Wong, MD    Reason for consultation: Decreased peri-incisional sensation    History of Present Illness:     Brian Ritter is a 43 year old male with a PMH of ankylosing spondylitis, autism,status [place] [facility] [place] for suspicious fluid collection.He re-presents with a history of multiple recurrent presentations to ED(8/10 and 8/18)for worsening pain and subjective weakness/fevers.NSG was consulted for progressive weakness of right lower extremity. Pt is currently s/p I&D and has recs for treatment w/ IV Dapto after dx of MRSA bacteremia with epidural infection but is not receiving treatment due to inability to get to infusion center. Patient reports progressive weakness and loss of sensation in his right leg over the last several days. He is able to ambulatewithout assistive devices.    Pt had repeat MRI done for febrile episode, sepsis,     Past Medical and Surgical History:  Per patient   Discectomy 10 yrs ago- disc herniation  T7-10 (self-reported) laminectomy 10.2019- benign mass  T8-L1 decompression and fusion 8.3.2020, complicated by infection requiring I and D      Past Medical and Surgical History:  No past medical history on file.  No past surgical history on file.    Allergies:  Allergies   Allergen Reactions   . Wound Dressing Adhesive Hives       Medications:  Current Facility-Administered Medications   Medication   . clotrimazole (LOTRIMIN) 1 % cream   . diclofenac (VOLTAREN) 1 % gel 4 g   . enoxaparin (LOVENOX) injection 40 mg   . gabapentin (NEURONTIN) tablet 600 mg   . heparin (HEP-LOCK) flush injection 100 Units   . heparin (HEP-LOCK) flush injection 100 Units   . ibuprofen (MOTRIN) tablet 800 mg   . lidocaine (LMX 4) 4 % cream   . nalOXone (NARCAN) injection 0.1 mg   . oxyCODONE (ROXICODONE) tablet 7.5 mg   .  piperacillin-tazobactam (ZOSYN) 3,375 mg in sodium chloride 0.9 % 50 mL IVPB   . polyethylene glycol (MIRALAX) packet 17 g   . sodium chloride 0.9 % flush 10 mL   . sodium chloride 0.9 % flush 10 mL   . sodium chloride 0.9 % flush 3 mL   . sodium chloride 0.9 % flush 3 mL   . sodium chloride 0.9 % TKO infusion   . tiZANidine (ZANAFLEX) tablet 4 mg   . vancomycin (VANCOCIN) 750 mg in sodium chloride 0.9 % 250 mL IVPB       Social History:  Social History     Socioeconomic History   . Marital status: Single     Spouse name: Not on file   . Number of children: Not on file   . Years of education: Not on file   . Highest education level: Not on file   Occupational History   . Not on file   Tobacco Use   . Smoking status: Not on file   Substance and Sexual Activity   . Alcohol use: Not on file   . Drug use: Not on file   . Sexual activity: Not on file   Social Activities of Daily Living Present   . Not on file   Social History Narrative   . Not on file       Family History:  Non-contributory  Review of Systems:    A 10 point review of systems was performed and was negative, except as listed in HPI     Labs and Other Data:    Recent Labs     07/22/19  1435 07/23/19  0610 07/23/19  0945   NA 136 132* 133*   K 4.2 3.8 4.2   CL 98 95* 98   BICARB 27 24 26    BUN 19 22* 21*   CREAT 1.01 1.01 1.00   GLU 106* 121* 121*   [place] 8.9 8.8 8.9   MG  --   --  1.9   PHOS  --   --  3.5     Recent Labs     07/22/19  1435 07/23/19  0945   WBC 8.6 9.6   HGB 12.6* 12.0*   HCT 39.3* 37.0*   PLT 169 149   SEG 67 78     No results for input(s): PT, INR, PTT in the last 72 hours.    Physical Exam:  BP 120/52 (BP Location: Right arm, BP Patient Position: Semi-Fowlers)   Pulse 124   Temp 100.5 F (38.1 C)   Resp 26   Ht 5\' 6"  (1.676 m)   Wt 72.6 kg (160 lb)   SpO2 97%   BMI 25.82 kg/m     A&Ox4 FCx4  CNII-XII intact  5/5 BUE  5/5 LLE, 5/5 hip flexion/knee extention, 4/5 R dorsiflexion/plantar flexion  decreased sensation to light touch in  RLE  Decreased peri-incisional sensation    Imaging:   MRI T/L spine:   Interval extension of the elongated subcutaneous fluid collection from T6-L2 with interval increase in thickness at T6-T7 measuring up to 6 mm in thickness, previously immeasurable. The lower component at L2 has decreased in size and now measures 7 mm. This may represent postsurgical seroma with fluid redistribution    Assessment/Plan  42 year old male with PMH of ankylosing spondylitis, autism,prior discectomy and multi-level laminectomy now status post T8-L1 posterior decompression and fusion at [facility] in [place] subsequent I+D on 8/3 presenting to ED for progressive weakness in RLE found to have MRSA bacteremia and MRI concerning for epidural infection . NSG is re-consulted after pt became febrile, tachycardic, and increasing size of seroma up to T6 with decreased peri-incisional sensation in back, non-dermatomal distribution. Given relatively small size of seroma with no thecal extension no neurosurgical intervention indicated at this time.   - Continue medical management of current infectious process    The above plan was staffed with the chief resident and staffed with attending, Dr. Ria Comment.    Lucy Antigua, MD   Neurosurgery Resident  [place]  [facility] Pager [facility], [place]

## 2019-07-23 NOTE — Interdisciplinary (Signed)
Report given to Phoung, RN 10E.

## 2019-07-23 NOTE — Progress Notes (Signed)
Brian Ritter day:   19 days - Admitted on: 07/03/2019    Brian Ritter is a89 year oldmale with hx ofankylosing spondylitis, Ehler's Danlos, autism, and recent spinal fusion surgery c/b postop seroma with MRSA s/p I&D 06/13/19 and initiation of daptomycin twice with readmissions for trouble adhering to outpatient infusion therapy, who was admitted for MRSA seroma/bacteremia with negative repeat BCx.    24 Hour - Interval Events   Febrile to 100.7    Subjective     Reports feeling fevers and chills last night. Endorses some dysuria and increased urinary frequency recently. Reports back numbness is unchanged from yesterday. Denies bowel, bladder incontinence. Denies shortness of breath, chest pain, cough, sputum production.     Physical Exam   Temp  Min: 96.5 F (35.8 C)  Max: 100.7 F (38.2 C)  Pulse  Min: 112  Max: 130  BP  Min: 105/60  Max: 154/126  Resp  Min: 16  Max: 30  SpO2  Min: 96 %  Max: 98 %    BP 105/60 (BP Location: Right arm, BP Patient Position: Supine)   Pulse 112   Temp 98.2 F (36.8 C)   Resp 20   Ht 5\' 6"  (1.676 m)   Wt 72.6 kg (160 lb)   SpO2 96%   BMI 25.82 kg/m  O2 Device: None (Room air)      09/11 0600 - 09/12 0559  In: 2727 [P.O.:2214; I.V.:513]  Out: -       GENERAL EXAM: NAD, well-developed, well-nourished  SKIN:  Minimal erythema of right bicep. Well healed, non-tender surgical incision in back. No purulent drainage, fluctuance, or dehiscence.   CARDIAC: RRR. Normal S1, S2, no m/r/g  PULM: CTAB, no use of accessory muscles   EXTREMITIES: WWP, no peripheral edema, mild decrease in sensation to light touch in Right leg which he says is normal since admission,   NEUROLOGIC EXAM: Alert and oriented, fluent, conversant. Normal language output and comprehension. Moving all 4 extremities spontaneously.   SKIN: erythematous plaque in right inguinal fold, with raised scaly border, no crusting or drainage    Left upper extremity PICC clean, dry, and intact.  No tenderness to palpation, erythema, serosanguinous drainage appreciated.       Pertinent Labs and Imaging and Meds     WBC 8.6 (09/11) HGB 12.6* (09/11) PLT 169 (09/11)    HCT 39.3* (09/11)      Na 132* (09/12) CL 95* (09/12) BUN 22* (09/12) GLU   121* (09/12)   K 3.8 (09/12) CO2 24 (09/12) Cr 1.01 (09/12)        BCx 8/23: GP cocci in clusters, presence of MRSA DNA  BCx 8/24-8/28, 8/31 NGTD    [place] upper extremities, 07/12/2019:  1. Occlusive superficial thrombus within the bilateral cephalic veins.  2. No evidence of acute deep vein thrombosis within either upper extremity.    MRI thoracic and lumbar spine w/ and w/o contrast 07/04/19  1. Enhancement and incomplete fat suppression within the dorsal epidural fat from T6-T8, is concerning for infection/phlegmon within the epidural space; no visible epidural abscess within the limits of hardware artifact. Continued paraspinal inflammation and bilateral pleural effusions, without visible posterior mediastinal/pleural/retroperitoneal abscess. Dr. Dionisio Paschal was paged to alert him of findings at 1730 hours on 07/04/2019.    2. Persistent mild enhancement of the ventral cauda equina nerve roots as seen on prior MRI 06/21/2019.    3. Redemonstration of posterior decompression and instrumented  fusion from T8-L1 with interval decrease in the size of a postoperative fluid collection in the midline subcutaneous tissues.    4. Multilevel degenerative changes without high-grade central canal stenosis in either the thoracic or lumbar spine, within the limitations of metallic artifact from the patient's spinal fusion hardware.    MRI thoracic and lumbar spine w/ and w/o contrast 07/23/19:  Final read pending     Scheduled Medications  . clotrimazole   BID   . diclofenac  4 g 4x Daily   . enoxaparin  40 mg Daily   . gabapentin  600 mg TID   . piperacillin-tazobactam (ZOSYN) IVPB  3,375 mg Q8H   . polyethylene glycol  17 g Daily   . sodium chloride  10 mL Q8H   . sodium chloride  3 mL Q8H    . vancomycin (VANCOCIN) IVPB  750 mg Q8H     Continuous Medications  . sodium chloride       PRN Medications  . heparin  100 Units Once PRN   . heparin  100 Units Once PRN   . ibuprofen  800 mg Q6H PRN   . lidocaine   Q4H PRN   . nalOXone  0.1 mg Q2 Min PRN   . oxyCODONE  7.5 mg Q4H PRN   . sodium chloride  10 mL PRN   . sodium chloride  3 mL PRN   . sodium chloride   Continuous PRN   . tiZANidine  4 mg Q6H PRN     Assessment / Plan   Brian Ritter is a43 year oldmale with hx ofankylosing spondylitis, Ehler's Danlos, autism, and recent spinal fusion surgery c/b postop seroma with MRSA s/p I&D 06/13/19 and initiation of daptomycin twice with readmissions for trouble adhering to outpatient infusion therapy, who was admitted for MRSA seroma/bacteremia with negative repeat BCx.    #MRSA T5-L3 seroma infection  #Fever, tachycardia  Stable. Continuing IV abx for MRSA seroma infection from prior spinal surgery. Has failed to complete IV abx therapy as outpatient and also has hx of discomfort with PIV and has pulled out PICC, so prefer inpatient treatment. S/p PICC placement. Refusing SNF due to concerns of phonophobia and other triggers which make him uncomfortable. Patient requesting to complete antibiotics in outpatient infusion center; however have informed patient this is not an option given he has previously failed outpatient antibiotics twice. Febrile to 100.7 07/23/2019 w/ associated tachycardia. Will start zosyn in addition to vanc and f/u blood cultures. Patient reports some new onset dysuria so will get U/A as well.   - f/u final read MRI T/L spine    - IV vancomycin x 6 weeks, per ID (IV abx restarted 8/24)  - start IV zosyn   - f/u blood cultures   - f/u U/A  - Pain control: oxycodone 7.5 mg PO PRN, tylenol 975mg  q8h, topical lidocaine and diclofenac  - weekly blood work and inflammatory markers     #Superficial thrombophlebitis  Improving. Monitoring for signs of compartment syndrome or infection. Coag  panel unremarkable.  - Warm compress  - Diclofenac cream    #RLE Discomfort   Likely neuropathic pain. Has had improvement with similar symptoms in past with gabapentin, previously tolerated dose of 900 mg TID.  - continue gabapentin 600 mg TID, can titrate as tolerates     #Tinea corporis, right inguinal region  - clotrimazole cream to right inguinal region BID     #Elevated Liver Enzymes, Chronic hepatitis C  Chronically  elevated liver enzymes, with increase in this week's lab work. Repeat liver panel stable. Has history of outside lab work with positive hepatitis c ab and elevated viral load.   - outpatient hepatology follow up     #Hyponatremia  Stable. 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 Low concern for more malignant causes of SIADH (no meningismus or cranial nerve deficits, HIV negative 05/2019). No symptoms of hyponatremia, so will CTM.    Discharge planning: placement and clinical improvement  Foley: No foley catheter  VTE prophylaxis: LMWH  Diet: Diet Regular  Nutritional Supplement Premier Protein; Chocolate (PP) (or vanilla); Deliver Supplements: BID  Last BM: unknown  IV fluids: No IV fluids  Access: PICC  Code status: Full Code

## 2019-07-23 NOTE — Plan of Care (Signed)
Problem: Promotion of Health and Safety  Goal: Promotion of Health and Safety  Description: The patient remains safe, receives appropriate treatment and achieves optimal outcomes (physically, psychosocially, and spiritually) within the limitations of the disease process by discharge.    Information below is the current care plan.  Outcome: Progressing  Flowsheets  Taken 07/23/2019 1655  Guidelines: Inpatient Nursing Guidelines  Individualized Interventions/Recommendations #1: Contact provider for anxiety medication.  Individualized Interventions/Recommendations #2 (if applicable): Keep curtain closed and provide ear plugs to decrease stimulation and increase pt. comfort.  Individualized Interventions/Recommendations #3 (if applicable): Provide pt. w/ iPad for distraction/stimulation.  Individualized Interventions/Recommendations #4 (if applicable): Educate pt. on using the call bell for assistance before getting OOB.  Outcome Evaluation (rationale for progressing/not progressing) every shift: Pt. arrived to unit agitated d/t sharing inpatient room and verbally abusive to staff. Pt. was redirected and provided anxiety med. per pt. request. Pt. uses call bell appropriately for all requests (i.e. iPad, charger, ice water) but becomes agitated when RN at bedside regarding requests, stating "I just want to be left alone! I'm overstimulated!" Pt. tearful in bed, per report from [place], pt. required 2x dose of Ativan prior day d/t environmental changes. Pt. provided ear plugs and curtain closed to decrease stimulation, pt. currently sleeping at this time. ST 110's, RA, no s/sx of acute distress noted.  Taken 07/23/2019 1333  Patient /Family stated Goal: I want something for anxiety

## 2019-07-23 NOTE — Plan of Care (Signed)
Problem: Promotion of Health and Safety  Goal: Promotion of Health and Safety  Description: The patient remains safe, receives appropriate treatment and achieves optimal outcomes (physically, psychosocially, and spiritually) within the limitations of the disease process by discharge.    Information below is the current care plan.  Outcome: Progressing  Flowsheets  Taken 07/23/2019 0005 by Lorin Picket, RN  Guidelines: Inpatient Nursing Guidelines  Outcome Evaluation (rationale for progressing/not progressing) every shift: MRI of spine done tonight, pre-medicated with IV ativan 1mg  per pt request. Receiving IV abx as ordered. Pain management ongoing.  Taken 07/22/2019 2053 by Lorin Picket, RN  Patient /Family stated Goal: i need my pain meds  Taken 07/22/2019 1103 by Cherylann Ratel, RN  Individualized Interventions/Recommendations #5 (if applicable): Set limits and boundaries  Taken 07/22/2019 0148 by Lorin Picket, RN  Individualized Interventions/Recommendations #3 (if applicable): Pt requesting to speak with I.T. requiring hospital's iPad technical difficulties.  Taken 07/20/2019 1145 by Antony Odea, RN  Individualized Interventions/Recommendations #1: cluster care to help promote rest  Individualized Interventions/Recommendations #2 (if applicable): keep door closed per pt request  Taken 07/17/2019 1143 by Yolande Jolly, RN  Individualized Interventions/Recommendations #4 (if applicable): Collaborate with CM for d/c planning

## 2019-07-23 NOTE — Interdisciplinary (Signed)
RE: first call/text page [phone]    "Cordae, Mccarey 919C    Can you come assess the pt pls? Pt is shivering nonstop, VS 154/126, HR 104, MAP 135. Temp is 96.5. Pls advise, thanks."

## 2019-07-23 NOTE — Interdisciplinary (Signed)
RE: first call/text page [phone]    "[name], [name] 601[address]  Pt now has a temp 100.7 and is tachycardic HR 130. Any new orders? Tylenol? Blood cx? pls advise, thanks."

## 2019-07-24 ENCOUNTER — Ambulatory Visit (HOSPITAL_BASED_OUTPATIENT_CLINIC_OR_DEPARTMENT_OTHER): Payer: Medicaid Other

## 2019-07-24 NOTE — Plan of Care (Signed)
Problem: Promotion of Health and Safety  Goal: Promotion of Health and Safety  Description: The patient remains safe, receives appropriate treatment and achieves optimal outcomes (physically, psychosocially, and spiritually) within the limitations of the disease process by discharge.    Information below is the current care plan.  Outcome: Not Progressing  Flowsheets  Taken 07/24/2019 0143 by Kathlene Cote, RN  Guidelines: Inpatient Nursing Guidelines  Outcome Evaluation (rationale for progressing/not progressing) every shift: Patient refused medications and acted aggressive towards rooomate and staff.  Given pain medications for pain and medication for anxiety.  Patient still upset with staff and others that cross his path.  Disconnects IV and vital signs machine.  Frequent rounds for safety and bed alarm.  MD aware.  Continue with plan of care.  Taken 07/23/2019 1930 by Kathlene Cote, RN  Patient /Family stated Goal: to get sleep  Taken 07/23/2019 1655 by Danelle Berry, RN  Individualized Interventions/Recommendations #1: Contact provider for anxiety medication.  Individualized Interventions/Recommendations #2 (if applicable): Keep curtain closed and provide ear plugs to decrease stimulation and increase pt. comfort.  Individualized Interventions/Recommendations #3 (if applicable): Provide pt. w/ iPad for distraction/stimulation.  Individualized Interventions/Recommendations #4 (if applicable): Educate pt. on using the call bell for assistance before getting OOB.  Taken 07/22/2019 1103 by Cherylann Ratel, RN  Individualized Interventions/Recommendations #5 (if applicable): Set limits and boundaries

## 2019-07-24 NOTE — Plan of Care (Signed)
Problem: Promotion of Health and Safety  Goal: Promotion of Health and Safety  Description: The patient remains safe, receives appropriate treatment and achieves optimal outcomes (physically, psychosocially, and spiritually) within the limitations of the disease process by discharge.    Information below is the current care plan.  Outcome: Progressing  Flowsheets  Taken 07/24/2019 1426 by Zannie Kehr, RN  Individualized Interventions/Recommendations #1: Assess pt's pain and administer meds prn  Individualized Interventions/Recommendations #2 (if applicable): Cluster care to decrease stimulation  Individualized Interventions/Recommendations #3 (if applicable): Reinforce to call for assistance  Individualized Interventions/Recommendations #4 (if applicable): Discuss POC with pt  Outcome Evaluation (rationale for progressing/not progressing) every shift:  . No falls  . pt states relief with oxy  . denies nausea  . IV zosyn d/c'd  . pt refused some meds, tele and PICC dressing change  . continue IV vanc  Pt verbalized understanding of POC  Taken 07/24/2019 0843 by Zannie Kehr, RN  Patient /Family stated Goal: to get better  Taken 07/24/2019 0143 by Kathlene Cote, RN  Guidelines: Inpatient Nursing Guidelines

## 2019-07-24 NOTE — Interdisciplinary (Signed)
9/12 Night shift.  Patient became upset early in the shift.  Stating that his dinner was taken from him while he was asleep.  He started yelling at staff using cuss words and posturing aggressively so security was called and MD was paged.  New orders received for ativan prn.  Patient refused medications, treatments, picc line dressing change, and told staff that he didn't feel comfortable with staff watching him like he was a child.  Teaching given on the importance of plan of care treatments and medications.  Continued teaching still needed.  At one point patient became aggressive towards the other patient in his room stating he was going to do a lot of cussing at night and everyone just has to deal with it.  When the patient told him to quite down he became aggressive to patient using cusswords and posturing.  Charge nurse notified and we were able to place [place] into private room.  All attempts were made prior to help redirect his behavior with little success.  In his new room and with medical management he was able to calm down.  Teaching given regarding appropriate hospital behavior.  Report given to oncoming nurse.  Continue with plan of care.

## 2019-07-24 NOTE — Progress Notes (Signed)
[facility] day:   20 days - Admitted on: 07/03/2019    Brian Ritter is a47 year oldmale with hx ofankylosing spondylitis, Ehler's Danlos, autism, and recent spinal fusion surgery c/b postop seroma with MRSA s/p I&D 06/13/19 and initiation of daptomycin twice with readmissions for trouble adhering to outpatient infusion therapy, who was admitted for MRSA seroma/bacteremia with negative repeat BCx.    24 Hour - Interval Events   No acute overnight events.     Subjective   Having some intermittent malaise, otherwise no complaints, happy having a private room.     Physical Exam   Temp  Min: 98 F (36.7 C)  Max: 99 F (37.2 C)  Pulse  Min: 86  Max: 122  BP  Min: 102/59  Max: 134/86  Resp  Min: 14  Max: 20  SpO2  Min: 96 %  Max: 97 %    BP 110/61 (BP Location: Right arm, BP Patient Position: Sitting)   Pulse 105   Temp 98.9 F (37.2 C)   Resp 20   Ht 5\' 6"  (1.676 m)   Wt 72.6 kg (160 lb)   SpO2 96%   BMI 25.82 kg/m  O2 Device: None (Room air)      09/12 0600 - 09/13 0559  In: 2673 [P.O.:1420; I.V.:1253]  Out: -       GENERAL EXAM: NAD, well-developed, well-nourished  SKIN:  Minimal erythema of right bicep. Well healed, non-tender surgical incision in back. No purulent drainage, fluctuance, or dehiscence.   CARDIAC: RRR. Normal S1, S2, no m/r/g  PULM: CTAB, no use of accessory muscles   EXTREMITIES: WWP, no peripheral edema, mild decrease in sensation to light touch in Right leg which he says is normal since admission,   NEUROLOGIC EXAM: Alert and oriented, fluent, conversant. Normal language output and comprehension. Moving all 4 extremities spontaneously.     Left upper extremity PICC clean, dry, and intact. No tenderness to palpation or erythema appreciated.       Pertinent Labs and Imaging and Meds     WBC 9.6 (09/12) HGB 12.0* (09/12) PLT 149 (09/12)    HCT 37.0* (09/12)      Na 133* (09/12) CL 98 (09/12) BUN 21* (09/12) GLU   121* (09/12)   K 4.2 (09/12) CO2 26 (09/12) Cr  1.00 (09/12)        BCx 8/23: GP cocci in clusters, presence of MRSA DNA  BCx 8/24-8/28, 8/31 NGTD    [place] upper extremities, 07/12/2019:  1. Occlusive superficial thrombus within the bilateral cephalic veins.  2. No evidence of acute deep vein thrombosis within either upper extremity.    MRI thoracic and lumbar spine w/ and w/o contrast 07/04/19  1. Enhancement and incomplete fat suppression within the dorsal epidural fat from T6-T8, is concerning for infection/phlegmon within the epidural space; no visible epidural abscess within the limits of hardware artifact. Continued paraspinal inflammation and bilateral pleural effusions, without visible posterior mediastinal/pleural/retroperitoneal abscess. Dr. Dionisio Paschal was paged to alert him of findings at 1730 hours on 07/04/2019.    2. Persistent mild enhancement of the ventral cauda equina nerve roots as seen on prior MRI 06/21/2019.    3. Redemonstration of posterior decompression and instrumented fusion from T8-L1 with interval decrease in the size of a postoperative fluid collection in the midline subcutaneous tissues.    4. Multilevel degenerative changes without high-grade central canal stenosis in either the thoracic or lumbar spine, within the  limitations of metallic artifact from the patient's spinal fusion hardware.    Scheduled Medications  . clotrimazole   BID   . diclofenac  4 g 4x Daily   . enoxaparin  40 mg Daily   . gabapentin  600 mg TID   . polyethylene glycol  17 g Daily   . sodium chloride  10 mL Q8H   . sodium chloride  3 mL Q8H   . vancomycin (VANCOCIN) IVPB  750 mg Q8H     Continuous Medications  . sodium chloride       PRN Medications  . heparin  100 Units Once PRN   . heparin  100 Units Once PRN   . ibuprofen  800 mg Q6H PRN   . lidocaine   Q4H PRN   . LORazepam  1 mg Q6H PRN   . nalOXone  0.1 mg Q2 Min PRN   . oxyCODONE  7.5 mg Q4H PRN   . sodium chloride  10 mL PRN   . sodium chloride  3 mL PRN   . sodium chloride   Continuous PRN   . tiZANidine  4  mg Q6H PRN     Assessment / Plan   Brian Ritter is a43 year oldmale with hx ofankylosing spondylitis, Ehler's Danlos, autism, and recent spinal fusion surgery c/b postop seroma with MRSA s/p I&D 06/13/19 and initiation of daptomycin twice with readmissions for trouble adhering to outpatient infusion therapy, who was admitted for MRSA seroma/bacteremia with negative repeat BCx.    #MRSA T5-L3 seroma infection  Stable. Continuing IV abx for MRSA seroma infection from prior spinal surgery. Has failed to complete IV abx therapy as outpatient and also has hx of discomfort with PIV and has pulled out PICC, so prefer inpatient treatment. S/p PICC placement. Refusing SNF due to concerns of phonophobia and other triggers which make him uncomfortable. Patient requesting to complete antibiotics in outpatient infusion center; however have informed patient this is not an option given he has previously failed outpatient antibiotics twice.   - overall labs and imaging without obvious sign of decompensation. Unclear recent mild febrile episode, but consulted neurosurgery who feel seroma is quite small and does not require drainage.   - discontinue IV zosyn as no obvious GNR source of infection was found.   - IV vancomycin x 6 weeks, per ID (IV abx restarted 8/24)  - Pain control: oxycodone 7.5 mg PO PRN, tylenol 975mg  q8h, topical lidocaine and diclofenac  - weekly blood work and inflammatory markers     #RLE Discomfort   Likely neuropathic pain. Has had improvement with similar symptoms in past with gabapentin, previously tolerated dose of 900 mg TID.  - will titrate to gabapentin 600 mg TID, can titrate as tolerates     #Tinea corporis, right inguinal region  - clotrimazole cream to right inguinal region BID     #Elevated Liver Enzymes, Chronic hepatitis C  Chronically elevated liver enzymes, with increase in this week's lab work. Repeat liver panel stable. Has history of outside lab work with positive hepatitis c ab and  elevated viral load.   - outpatient hepatology follow up     Discharge planning: placement and clinical improvement  Foley: No foley catheter  VTE prophylaxis: LMWH  Diet: Diet Regular  Nutritional Supplement Premier Protein; Chocolate (PP) (or vanilla); Deliver Supplements: BID  Last BM: unknown  IV fluids: No IV fluids  Access: PICC  Code status: Full Code

## 2019-07-25 ENCOUNTER — Ambulatory Visit (HOSPITAL_BASED_OUTPATIENT_CLINIC_OR_DEPARTMENT_OTHER): Payer: Medicaid Other

## 2019-07-25 DIAGNOSIS — G9763 Postprocedural seroma of a nervous system organ or structure following a nervous system procedure: Secondary | ICD-10-CM

## 2019-07-25 LAB — UR DRUGS OF ABUSE SCREEN
Amphetamines Screen: NEGATIVE
Barbiturates Screen: NEGATIVE
Benzodiazepine Screen: NEGATIVE
Cocaine Screen: NEGATIVE
Methadone Screen: NEGATIVE
Opiates Screen: NEGATIVE
Phencyclidine Screen: NEGATIVE
THC Screen: NEGATIVE

## 2019-07-25 LAB — CBC WITH DIFF, BLOOD
ANC-Automated: 4.9 10*3/uL (ref 1.6–7.0)
Abs Basophils: 0 10*3/uL
Abs Eosinophils: 0.2 10*3/uL (ref 0.0–0.5)
Abs Lymphs: 1.8 10*3/uL (ref 0.8–3.1)
Abs Monos: 0.8 10*3/uL (ref 0.2–0.8)
Basophils: 0 %
Eosinophils: 2 %
Hct: 38.6 % — ABNORMAL LOW (ref 40.0–50.0)
Hgb: 12.3 gm/dL — ABNORMAL LOW (ref 13.7–17.5)
Lymphocytes: 23 %
MCH: 27.6 pg (ref 26.0–32.0)
MCHC: 31.9 g/dL — ABNORMAL LOW (ref 32.0–36.0)
MCV: 86.7 um3 (ref 79.0–95.0)
MPV: 9.5 fL (ref 9.4–12.4)
Monocytes: 11 %
Plt Count: 139 10*3/uL — ABNORMAL LOW (ref 140–370)
RBC: 4.45 10*6/uL — ABNORMAL LOW (ref 4.60–6.10)
RDW: 14.9 % — ABNORMAL HIGH (ref 12.0–14.0)
Segs: 64 %
WBC: 7.7 10*3/uL (ref 4.0–10.0)

## 2019-07-25 LAB — LACTATE, BLOOD
Lactate: 2.5 mmol/L — ABNORMAL HIGH (ref 0.5–2.0)
Lactate: 1.9 mmol/L (ref 0.5–2.0)

## 2019-07-25 LAB — COMPREHENSIVE METABOLIC PANEL, BLOOD
ALT (SGPT): 148 U/L — ABNORMAL HIGH (ref 0–41)
AST (SGOT): 98 U/L — ABNORMAL HIGH (ref 0–40)
Albumin: 3.5 g/dL (ref 3.5–5.2)
Alkaline Phos: 131 U/L — ABNORMAL HIGH (ref 40–129)
Anion Gap: 13 mmol/L (ref 7–15)
BUN: 18 mg/dL (ref 6–20)
Bicarbonate: 24 mmol/L (ref 22–29)
Bilirubin, Tot: 0.4 mg/dL (ref <1.2)
Calcium: 9 mg/dL (ref 8.5–10.6)
Chloride: 93 mmol/L — ABNORMAL LOW (ref 98–107)
Creatinine: 1 mg/dL (ref 0.67–1.17)
GFR: >60 mL/min
Glucose: 106 mg/dL — ABNORMAL HIGH (ref 70–99)
Potassium: 4.2 mmol/L (ref 3.5–5.1)
Sodium: 130 mmol/L — ABNORMAL LOW (ref 136–145)
Total Protein: 8.1 g/dL — ABNORMAL HIGH (ref 6.0–8.0)

## 2019-07-25 LAB — SED RATE, BLOOD: Sed Rate: 70 mm/hr — ABNORMAL HIGH (ref 0–15)

## 2019-07-25 LAB — C-REACTIVE PROTEIN, BLOOD: CRP: 4.83 mg/dL — ABNORMAL HIGH (ref <0.5)

## 2019-07-25 LAB — MAGNESIUM, BLOOD: Magnesium: 1.9 mg/dL (ref 1.6–2.6)

## 2019-07-25 LAB — VANCOMYCIN, RANDOM: Vancomycin, Random: 16.1 ug/mL

## 2019-07-25 MED ORDER — SODIUM CHLORIDE 0.9 % IV SOLN
4500.0000 mg | Freq: Once | INTRAVENOUS | Status: AC
Start: 2019-07-25 — End: 2019-07-25
  Administered 2019-07-25: 14:00:00 4500 mg via INTRAVENOUS
  Filled 2019-07-25: qty 4500

## 2019-07-25 MED ORDER — LACTATED RINGERS IV SOLN
Freq: Once | INTRAVENOUS | Status: AC
Start: 2019-07-25 — End: 2019-07-25
  Administered 2019-07-25: 14:00:00 via INTRAVENOUS

## 2019-07-25 MED ORDER — OXYCODONE HCL 5 MG/5ML OR SOLN
7.5000 mg | ORAL | Status: DC | PRN
Start: 2019-07-25 — End: 2019-07-25
  Administered 2019-07-25 (×3): 7.5 mg via ORAL
  Filled 2019-07-25 (×2): qty 10

## 2019-07-25 MED ORDER — CARVEDILOL 3.125 MG OR TABS
3.1250 mg | ORAL_TABLET | Freq: Two times a day (BID) | ORAL | Status: DC
Start: 2019-07-25 — End: 2019-07-25

## 2019-07-25 MED ORDER — SODIUM CHLORIDE 0.9 % IV SOLN
Freq: Once | INTRAVENOUS | Status: AC
Start: 2019-07-25 — End: 2019-07-25
  Administered 2019-07-25: 06:00:00 via INTRAVENOUS

## 2019-07-25 MED ORDER — SODIUM CHLORIDE 0.9 % IV SOLN
3375.0000 mg | Freq: Three times a day (TID) | INTRAVENOUS | Status: DC
Start: 2019-07-25 — End: 2019-07-25

## 2019-07-25 MED ORDER — SODIUM CHLORIDE 0.9 % IV SOLN
Freq: Once | INTRAVENOUS | Status: DC
Start: 2019-07-25 — End: 2019-07-25

## 2019-07-25 MED ORDER — ACETAMINOPHEN 325 MG PO TABS
650.0000 mg | ORAL_TABLET | Freq: Four times a day (QID) | ORAL | Status: DC | PRN
Start: 2019-07-25 — End: 2019-07-25

## 2019-07-25 MED ORDER — ACETAMINOPHEN 325 MG PO TABS
650.0000 mg | ORAL_TABLET | Freq: Four times a day (QID) | ORAL | Status: DC | PRN
Start: 2019-07-25 — End: 2019-07-25
  Administered 2019-07-25 (×2): 650 mg via ORAL
  Filled 2019-07-25: qty 2

## 2019-07-25 NOTE — Interdisciplinary (Signed)
Security at pt.'s bedside to take pt.'s vape and 2 lighters. Pt. Gave items willing. Security will keep Vape in their possession until pt. Discharges.

## 2019-07-25 NOTE — Progress Notes (Signed)
Ordered EKG, Echo for further cardiac work-up. Per nursing staff, patient has refused echo, asked patient transport to wait, said he needed to look for something in room, then became angry with staff, started cursing and refused transport to Echo.

## 2019-07-25 NOTE — Interdisciplinary (Signed)
RN went in room to collect urine for Urine tox screen.  Pt voided in urinal and RN collected it and sent to Laboratory for processing.  While in room RN was cleaning up and discarding trash on floor. While in bathroom straightening up RN noted that there was a used syringe in wipes trash bin and found it to have red residue with sediment discarded in the bin.  RN was told that pt is known to pocket pills and crush them up later to put into his IV.  RN notified first to call MD that this may be happening with liquid oxycodone medication.  RN bagged syringe for MD to see.  RN was asked to leave room by pt when he received call from his mother. Pt currently yelling and cursing out mom over the phone.  MD notified of pt's behavior.

## 2019-07-25 NOTE — Interdisciplinary (Signed)
07/25/19 0426   Provider Notification   Provider Notified Physician   Provider Name 1st call   Method of Communication Text Page   Reason for Communication Re: Walton, Digilio. Pt. currently with temp of 100.7 orally. Cooling measures initiated. I don't have any orders for Tylenol to give. Please advise. Thank you!   Provider Response awaiting response

## 2019-07-25 NOTE — Plan of Care (Signed)
Problem: Promotion of Health and Safety  Goal: Promotion of Health and Safety  Description: The patient remains safe, receives appropriate treatment and achieves optimal outcomes (physically, psychosocially, and spiritually) within the limitations of the disease process by discharge.    Information below is the current care plan.  Outcome: Progressing  Flowsheets  Taken 07/25/2019 1210 by Danae Orleans, RN  Individualized Interventions/Recommendations #1: Contact MD regarding pt's intent to leave hospital AMA so that risks can be discussed with him  Individualized Interventions/Recommendations #4 (if applicable): Remove PICC line per MD order  Outcome Evaluation (rationale for progressing/not progressing) every shift: Pt agitated this AM (see note).  Threatening to go AMA.  RN gave PRN ativan and flexeril to treat anxiety and pain per MD request (for the ativan).  Pt spoke with MD and RN at length.  Pt states he would pee in cup and RN collected urine and sent to lab for UTOX.  PICC line removed this AM.  Pt currently has no IV access. RN asked pt if he planned to leave AMA still and he says probably but he needs to speak with his partner first, who is not answering their phone.  Pt fell asleep again shortly after this.  RN clustering nursing care to avoid aggitating pt.  Boundaries and limits are being set with the pt.  RN will continue with POC.  VSS at this time.  Taken 07/25/2019 0800 by Danae Orleans, RN  Patient /Family stated Goal: [facility] because i'm being culturally profiled here  Taken 07/24/2019 1426 by Zannie Kehr, RN  Individualized Interventions/Recommendations #2 (if applicable): Cluster care to decrease stimulation  Individualized Interventions/Recommendations #3 (if applicable): Reinforce to call for assistance  Taken 07/24/2019 0143 by Kathlene Cote, RN  Guidelines: Inpatient Nursing Guidelines  Taken 07/22/2019 1103 by Cherylann Ratel, RN  Individualized  Interventions/Recommendations #5 (if applicable): Set limits and boundaries

## 2019-07-25 NOTE — Progress Notes (Signed)
MEDICINE DAILY PROGRESS NOTE     Hospital day:   21 days - Admitted on: 07/03/2019    Brian Ritter is a43 year oldmale with hx ofankylosing spondylitis, Ehler's Danlos, autism, and recent spinal fusion surgery c/b postop seroma with MRSA s/p I&D 06/13/19 and initiation of daptomycin twice with readmissions for trouble adhering to outpatient infusion therapy, who was admitted for MRSA seroma/bacteremia with negative repeat BCx.    24 Hour - Interval Events   34 second run of V tach  Febrile to 102.6    Subjective   Patient appeared agitated and anxious this morning. Stated that he was frustrated with care he has received recently, feeling as though he has been talked to "as if I am a child". States that he is considering leaving AMA. This note writer informs patient about confirmed PICC line infection and dangers of leaving hospital with infection including worsening infection, sepsis and death, which patient verbalized understanding of. Endorses feeling feverish, denies SOB, chest pain, palpitations, nausea, vomiting.     Later endorsed using syringe filled with crushed oxycodone and ativan and injecting contents through PICC.     Physical Exam   Temp  Min: 97.8 F (36.6 C)  Max: 102.6 F (39.2 C)  Pulse  Min: 76  Max: 141  BP  Min: 107/62  Max: 128/81  Resp  Min: 12  Max: 30  SpO2  Min: 92 %  Max: 97 %    BP 112/65 (BP Location: Right arm, BP Patient Position: Semi-Fowlers)   Pulse 76   Temp 97.8 F (36.6 C)   Resp 17   Ht 5\' 6"  (1.676 m)   Wt 72.6 kg (160 lb)   SpO2 92%   BMI 25.82 kg/m  O2 Device: None (Room air)      09/13 0600 - 09/14 0559  In: 1330 [P.O.:1080; I.V.:250]  Out: -       GENERAL EXAM: NAD, well-developed, well-nourished, appears anxious and agitated  SKIN:  Minimal erythema of right bicep. Well healed, non-tender surgical incision in back. No purulent drainage, fluctuance, or dehiscence.   CARDIAC: RRR. Normal S1, S2, no m/r/g  PULM: CTAB, no use of accessory muscles   EXTREMITIES:  WWP, no peripheral edema, mild decrease in sensation to light touch in right leg which he says is normal since admission,   NEUROLOGIC EXAM: Alert and oriented, fluent, conversant. Normal language output and comprehension. Moving all 4 extremities spontaneously.     Left upper extremity PICC clean, dry, and intact. No tenderness to palpation or erythema appreciated.       Pertinent Labs and Imaging and Meds     WBC 7.7 (09/14) HGB 12.3* (09/14) PLT 139* (09/14)    HCT 38.6* (09/14)      Na 130* (09/14) CL 93* (09/14) BUN 18 (09/14) GLU   106* (09/14)   K 4.2 (09/14) CO2 24 (09/14) Cr 1.00 (09/14)        BCx 8/23: GP cocci in clusters, presence of MRSA DNA  BCx 8/24-8/28, 8/31 NGTD    [place] upper extremities, 07/12/2019:  1. Occlusive superficial thrombus within the bilateral cephalic veins.  2. No evidence of acute deep vein thrombosis within either upper extremity.    MRI thoracic and lumbar spine w/ and w/o contrast 07/04/19  1. Enhancement and incomplete fat suppression within the dorsal epidural fat from T6-T8, is concerning for infection/phlegmon within the epidural space; no visible epidural abscess within the limits of hardware artifact. Continued paraspinal inflammation  and bilateral pleural effusions, without visible posterior mediastinal/pleural/retroperitoneal abscess. Dr. Dionisio Paschal was paged to alert him of findings at 1730 hours on 07/04/2019.    2. Persistent mild enhancement of the ventral cauda equina nerve roots as seen on prior MRI 06/21/2019.    3. Redemonstration of posterior decompression and instrumented fusion from T8-L1 with interval decrease in the size of a postoperative fluid collection in the midline subcutaneous tissues.    4. Multilevel degenerative changes without high-grade central canal stenosis in either the thoracic or lumbar spine, within the limitations of metallic artifact from the patient's spinal fusion hardware.    Scheduled Medications  . clotrimazole   BID   . diclofenac  4 g 4x  Daily   . enoxaparin  40 mg Daily   . gabapentin  600 mg TID   . polyethylene glycol  17 g Daily   . sodium chloride  10 mL Q8H   . sodium chloride  3 mL Q8H   . vancomycin (VANCOCIN) IVPB  750 mg Q8H     Continuous Medications  . sodium chloride       PRN Medications  . acetaminophen  650 mg Q6H PRN   . heparin  100 Units Once PRN   . heparin  100 Units Once PRN   . ibuprofen  800 mg Q6H PRN   . lidocaine   Q4H PRN   . LORazepam  1 mg Q6H PRN   . nalOXone  0.1 mg Q2 Min PRN   . oxyCODONE  7.5 mg Q4H PRN   . sodium chloride  10 mL PRN   . sodium chloride  3 mL PRN   . sodium chloride   Continuous PRN   . tiZANidine  4 mg Q6H PRN     Assessment / Plan   Brian Ritter is a6 year oldmale with hx ofankylosing spondylitis, Ehler's Danlos, autism, and recent spinal fusion surgery c/b postop seroma with MRSA s/p I&D 06/13/19 and initiation of daptomycin twice with readmissions for trouble adhering to outpatient infusion therapy, who was admitted for MRSA seroma/bacteremia with negative repeat BCx.    #MRSA T5-L3 seroma infection  Stable. Continuing IV abx for MRSA seroma infection from prior spinal surgery. Has failed to complete IV abx therapy as outpatient and also has hx of discomfort with PIV and has pulled out PICC, so prefer inpatient treatment. S/p PICC placement. Refusing SNF due to concerns of phonophobia and other triggers which make him uncomfortable. Patient requesting to complete antibiotics in outpatient infusion center; however have informed patient this is not an option given he has previously failed outpatient antibiotics twice. Consulted neurosurgery given superior extension of seroma, but overall neurosurgery feels seroma still small without need for drainage at this time. Patient with recent febrile episodes, sepsis likely 2/2 confirmed PICC line infection. Also consider endocarditis, will order echo.   - IV vancomycin x 6 weeks, per ID (IV abx restarted 8/24)  - initiate Zanc   - remove PICC line  given confirmed infection   - order echo   - Pain control:  tylenol 975mg  q8h, topical lidocaine and diclofenac  - discontinued oxycodone  - weekly blood work and inflammatory markers     #V tach  Patient with 30 second run of V tach. Cardiology consulted.   - f/u EKG  - f/u Echo   - f/u cardiology recs    #RLE Discomfort   Likely neuropathic pain. Has had improvement with similar symptoms in past with gabapentin, previously  tolerated dose of 900 mg TID.  - continue gabapentin 600 mg TID, can titrate as tolerates     #Tinea corporis, right inguinal region  - clotrimazole cream to right inguinal region BID     #Elevated Liver Enzymes, Chronic hepatitis C  Chronically elevated liver enzymes. Has history of outside lab work with positive hepatitis c ab and elevated viral load.   - outpatient hepatology follow up     Discharge planning: placement and clinical improvement  Foley: No foley catheter  VTE prophylaxis: LMWH  Diet: Diet Regular  Nutritional Supplement Premier Protein; Chocolate (PP) (or vanilla); Deliver Supplements: BID  Last BM: unknown  IV fluids: No IV fluids  Access: PICC  Code status: Full Code

## 2019-07-25 NOTE — Progress Notes (Signed)
Pt with fever to 102.6, lactate of 2.5. Pt on vanc for MRSA seroma. Zosyn discontinued earlier today   Tylenol given   1 L of NS bolus over 2 hours   Repeat lactate

## 2019-07-25 NOTE — Interdisciplinary (Signed)
07/25/19 0526   Provider Notification   Provider Response Received call back from Dr. Al Decant. Per M.D. states will put in orders for IV bolus, and states will review labs and cultures at this time. Per M.D. pt. will be upgraded to IMU level of care.

## 2019-07-25 NOTE — Interdisciplinary (Addendum)
RN text paged EKG tech:RE: Please come perform bedside 12 lead EKG.

## 2019-07-25 NOTE — Progress Notes (Addendum)
Pharmacokinetics Note - Vancomycin  Vancomycin Indication: MRSA Seroma (Goal: Trough 15 - 20 mg/L, AUIC 400-600), started 8/24  Vancomycin level: 16.1 mg/L on 9/14 at 0356, random, at steady state  Kidney Function: SCr: 1 mg/dL, at baseline.  Culture results: 8/23 blood: MRSA MIC 1    Assessment / Plan:    Pharmacokinetic Parameters: Volume of distribution: 49.4 L, Clearance: 4.4 L/h, Half-life: 8 h   Current regimen of 750mg  IV q8h gives an estimated trough at steady state of 15, which is therapeutic.    Will continue current dose. Monitor kidney function and plan random level on 9/21 or sooner if clinically indicated.    Pharmacist will continue to monitor and make adjustments as needed.    Brian Ritter, PHARMD

## 2019-07-25 NOTE — Interdisciplinary (Signed)
07/25/19 0359   Provider Notification   Provider Notified Physician   Provider Name 1st call   Method of Communication Text Page   Reason for Communication Re: Ario, Mcdiarmid. Pt. had a run of 35sec of wide complex VTach. Highest HR up to 160's @ around 0330. RRT. was at bedside. EKG was done. Pt. is asymptomatic. Initially complained of HA, but resolved on its own. Labs drawn. Please advise.   Provider Response awaiting

## 2019-07-25 NOTE — Consults (Signed)
[facility] Consult Note    Reason: Wide Complex Tachycardia    HPI:  43 year old male with polysubstance abuse, spinal seroma MRSA infection, cauda equina syndrome, and systolic heart failure who had a narrow complex tachycardia overnight.  His blood pressure was stable and he was asymptomatic.  Of note, there is question if he injected an unknown substance just prior to the event as he was found with drug paraphernalia.  Upon questioning, the patient was very lethargic and fell asleep during questions.  He denied any cardiac history, any arrhythmias, or any syncope.    On telemetry review, the patient had a 26 second run of a wide complex tachycardia as well as a six second run prior to that event.      PMH:  Spinal Seroma MRSA bacteremia  Cauda Equina Syndrome  HF-rEF  Polysubstance Abuse    Allergies:   Adhesives    Current Cardiac Medications:  None    SH:  Ongoing substance abuse    FH:  Denies premature CAD or arrest in family    Temperature:  [97.8 F (36.6 C)-102.6 F (39.2 C)] 97.8 F (36.6 C) (09/14 0800)  Blood pressure (BP): (80-128)/(50-81) 98/57 (09/14 1320)  Heart Rate:  [74-141] 77 (09/14 1400)  Respirations:  [12-30] 17 (09/14 1400)  Pain Score: 0 (09/14 1315)  O2 Device: None (Room air) (09/14 1320)  SpO2:  [92 %-97 %] 94 % (09/14 1400)     General: NAD, lethargic  HEENT: JVD 6-7 cm  Derm: Multiple tattoos   Lines: PICC line  Lungs: CTA throughout anteriorly  Heart: RRR, no murmurs  Extr: No edema, warm  Msk: 5/5 upper and lower strength bilaterally  Neuro: Lethargic  Vascular: 2+ radial pulses    Diagnostics:  Lab Results   Component Value Date    BUN 18 07/25/2019    CREAT 1.00 07/25/2019    CL 93 (L) 07/25/2019    NA 130 (L) 07/25/2019    K 4.2 07/25/2019    [place] 9.0 07/25/2019    TBILI 0.40 07/25/2019    ALB 3.5 07/25/2019    TP 8.1 (H) 07/25/2019    AST 98 (H) 07/25/2019    ALK 131 (H) 07/25/2019    BICARB 24 07/25/2019    ALT 148 (H) 07/25/2019    GLU 106 (H) 07/25/2019       Lab  Results   Component Value Date    WBC 7.7 07/25/2019    RBC 4.45 (L) 07/25/2019    HGB 12.3 (L) 07/25/2019    HCT 38.6 (L) 07/25/2019    MCV 86.7 07/25/2019    MCHC 31.9 (L) 07/25/2019    RDW 14.9 (H) 07/25/2019    PLT 139 (L) 07/25/2019    MPV 9.5 07/25/2019       Echo:  Left Ventricle: The left heart agent Definity was used to enhance endocardial borders. The left ventricular size is mildly increased. There is moderately depressed left ventricular function. Left ventricular ejection fraction by Simpson's biplane is 38%. There is no left ventricular hypertrophy. Normal relaxation pattern of left ventricular diastolic filling.  Right Ventricle: The right ventricular size is normal. Global right ventricular systolic function is normal.  Left Atrium: The left atrium is normal sized.  Right Atrium: The right atrium is normal in size.  Pericardium: There is no evidence of pericardial effusion.  Mitral Valve: The mitral valve is normal in structure. Trace mitral valve regurgitation.  Tricuspid Valve: The tricuspid valve is  normal in structure. No tricuspid regurgitation.  Aortic Valve: The aortic valve appeared normal (trileaflet). No evidence of aortic valve regurgitation is seen.  Pulmonic Valve: The pulmonic valve is normal. No indication of pulmonic valve regurgitation.  Aorta: The aortic root is normal measuring 2.59 cm and 1.43 cm/m index.  Pulmonary Artery: The pulmonary artery is of normal size and origin.  Summary:  No clear vegetations appreciated on this study. If strong clinical suspicion for endocarditis consider transesophageal echocardiogram.  The left ventricular size is mildly increased. The left ventricular systolic function is moderately depressed.  The right ventricular size is normal and systolic function is normal.  No significant valvular abnormalities.    EKG: NSR, no acute ST-T wave changes, QTc < 460 msec     A/P:  43 year old male with polysubstance abuse, spinal seroma MRSA infection, cauda  equina syndrome, and systolic heart failure who had a narrow complex tachycardia overnight.  He was normotensive and asymptomatic during the event.  The telemetry was reviewed and he had 2 episodes of wide complex tachycardia around 0330 am.  One lasted 4 seconds and the other was 26 seconds.  Each had a rate of 150 bpm and each had no identifiable A-V dissociation.  There were no capture or fusion beats.  The EKG was without evidence of an accessory pathway or a prolonged QTc interval.  His echo was reviewed and he does have a depressed EF of 38% however no significant LV diameter change.  There also was concern for possible drug ingestion prior to the event however there is no knowledge of what substance it was.  Overall, he has structural changes to his heart likely from polysubstance abuse that place him at a higher likelihood of ventricular arrhythmias.  Would recommend:    1)  Start coreg 3.125 mg bid both for arrhythmia/ectopy suppression and management of a depressed EF.  Consider adding an ACE-I/ARB as well.  2)  Keep K > 4, Mg > 2  3)  We will run the rhythm by our EP specialists    Case discussed with Dr. Jari Pigg

## 2019-07-25 NOTE — Interdisciplinary (Signed)
07/25/19 0920   Follow Up/Progress   Is the Patient Ready for Discharge * No   Anticipated Discharge Dispostion/Needs Other;SNF   Post Acute Services Referred To Facility/SNF   Barriers to Discharge * Other (Comment);Awaiting clinical improvement   Patient/Family/Legal/Surrogate Decision Maker Has Been Given a List Options And Choice In The Selection of Post-Acute Care Providers * Yes   Family/Caregiver's Assessed for * Not Applicable   Respite Care * Not Applicable   Patient/Family/Other Are In Agreement With Discharge Plan * To be determined   Public Health Clearance Needed * No       07/25/19  9:20 AM    Medical Intervention(s) requiring continued Hospital Stay:  Tx to IMU due to tachy and fever; CX pending    Anticipated dispo plan (SNF, Home, ILF, etc.) and anticipated DC needs (None, HHPT, DME, etc.):    Lost to follow up 2 x for infection/IV ABX   SNF: LOA obtained from out of area [facility] medical to [facility] (see parry's notes) . Patient not amenable to SNF placement although likely needs to be in SNF     Barriers to Discharge:  Clin improvement; patient agreement needed for correct level of care      [place], RN  Care Manager

## 2019-07-25 NOTE — Interdisciplinary (Signed)
07/25/19 0430   Provider Notification   Provider Response Received orders for Tylenol

## 2019-07-25 NOTE — Interdisciplinary (Signed)
07/25/19 0405   Provider Notification   Provider Notified Physician   Provider Response Received call back from Dr. Orpah Greek, per M.D. cont. monitor, and will put in order for Urine tox. screen

## 2019-07-25 NOTE — Interdisciplinary (Addendum)
RN text paged first to call MD:RE: PICC removed but RN unsure if tip removed intact. End does not have black, blue or purple tip.  Catheter saved for you to see.  Pt lethargic/ wakes to nonpainful stimuli.  He says he wants to wait to leave until after he speaks to his partner. No IV access at this time.     MD and PICC RN came by. PICC intact. They are cut to size based on pt's anatomy.  RN spoke with MD regarding pt's plan.  Pt states he still does not know if he is leaving AMA.  MD will speak with pt again at 1330. Until then RN will leave pt alone to avoid agitating pt.  No IV insertion until it is known if pt is staying or not.  RN will obtain one set of vitals and leave pt alone.

## 2019-07-25 NOTE — Interdisciplinary (Signed)
RN returned from break and pt was upset and ready to leave to [place] notified MD that he had paged EKG tech before leaving for break but they did not come by. Pt also refused to go to ECHO when transport arrived.  Pt wants to leave and was verbally abusive to this RN and hospital staff. Bolus completed and VSS. RN  removed the IVs (pt had halfway pulled out already) and placed dressings. No s/s of bleeding. Pt signed [place] paperwork. He is getting his things together and will leave shortly.

## 2019-07-25 NOTE — Interdisciplinary (Signed)
07/25/19 0519   Provider Notification   Provider Notified Physician   Provider Name 1st call   Method of Communication Text Page   Reason for Communication RE: [place], Nestor: Pt. now with temp of 102.6 orally after Tylenol given about an hour ago. Current Lactate is 2.5. Consider pt. to be IMU level of care. Would you like me to call a code sepsis. Please advise. Thank you!   Provider Response awaiting

## 2019-07-25 NOTE — Interdisciplinary (Signed)
Report called to RN RJ. Pt transferred to 1104 with RN.

## 2019-07-26 ENCOUNTER — Ambulatory Visit (HOSPITAL_BASED_OUTPATIENT_CLINIC_OR_DEPARTMENT_OTHER): Payer: Medicaid Other

## 2019-07-27 ENCOUNTER — Ambulatory Visit (HOSPITAL_BASED_OUTPATIENT_CLINIC_OR_DEPARTMENT_OTHER): Payer: Medicaid Other

## 2019-07-27 DIAGNOSIS — R000 Tachycardia, unspecified: Secondary | ICD-10-CM

## 2019-07-27 DIAGNOSIS — I495 Sick sinus syndrome: Secondary | ICD-10-CM

## 2019-07-27 LAB — ECG 12-LEAD
ATRIAL RATE: 108 {beats}/min
P AXIS: 68 degrees
PR INTERVAL: 126 ms
QRS INTERVAL/DURATION: 102 ms
QT: 346 ms
QTC INTERVAL: 463 ms
R AXIS: -25 degrees
T AXIS: 61 degrees
VENTRICULAR RATE: 108 {beats}/min

## 2019-07-28 ENCOUNTER — Ambulatory Visit (HOSPITAL_BASED_OUTPATIENT_CLINIC_OR_DEPARTMENT_OTHER): Payer: Medicaid Other

## 2019-07-28 LAB — BLOOD CULTURE: Blood Culture Result: NO GROWTH

## 2019-07-29 LAB — BLOOD CULTURE: Blood Culture: NOT DETECTED — AB

## 2019-07-29 LAB — CONFIRM OPIATES, URINE
Conf. 6 Acetylmorphine: NEGATIVE ng/mL (ref 0–9)
Conf. Codeine: NEGATIVE ng/mL (ref 0–99)
Conf. EDDP: NEGATIVE ng/mL (ref 0–99)
Conf. Fentanyl: NEGATIVE ng/mL (ref 0–4)
Conf. Hydrocodone: NEGATIVE ng/mL (ref 0–99)
Conf. Hydromorphone: NEGATIVE ng/mL (ref 0–99)
Conf. Methadone: NEGATIVE ng/mL (ref 0–99)
Conf. Morphine: NEGATIVE ng/mL (ref 0–99)
Conf. Norcodeine: NEGATIVE ng/mL (ref 0–99)
Conf. Norfentanyl: NEGATIVE ng/mL (ref 0–4)
Conf. Norhydrocodone: NEGATIVE ng/mL (ref 0–99)
Conf. Noroxycodone: POSITIVE ng/mL — AB (ref 0–49)
Conf. Oxycodone: POSITIVE ng/mL — AB (ref 0–49)
Conf. Oxymorphone: POSITIVE ng/mL — AB (ref 0–49)

## 2019-07-30 LAB — BLOOD CULTURE
Blood Culture Result: NO GROWTH
Blood Culture Result: NO GROWTH

## 2019-07-30 NOTE — Telephone Encounter (Signed)
OUTPATIENT PARENTERAL ANTIBIOTIC THERAPY (OPAT) DISCHARGE NOTE     Delayed entry: OPAT Discharge Summary  Patient Name: Brian Ritter   DOB: May 27, 1976   Age: 43 year old    Patient missed ID appointment & infusion center appts.      06/30/19: Attempted to reach patient about his missed infusion center appointments as of today he has missed 6 doses of Daptomycin for treatment of MRSA Spinal Hardware Infection.     Noted that patient had checked into the ED on 06/28/19 but left without being seen. It was also noted that the patient was verbally abusive screaming insults at staff prior to leaving ED.     Left a voicemail with direct clinic/call center phone number and requested a call back to discuss his missed appointments and determine if there are any barriers to coming in for his infusions.     Noted that patient returned to [facility] ED on 07/03/19. Patient was admitted but subsequently left AMA.      Patient discharged from OPAT effective 07/10/19

## 2020-02-08 ENCOUNTER — Encounter (INDEPENDENT_AMBULATORY_CARE_PROVIDER_SITE_OTHER): Payer: Self-pay
